# Patient Record
Sex: Male | Born: 1945
Health system: Southern US, Community
[De-identification: ages and names within clinical notes are randomized; demographics above are authoritative.]

## PROBLEM LIST (undated history)

## (undated) DIAGNOSIS — H409 Unspecified glaucoma: Secondary | ICD-10-CM

## (undated) DIAGNOSIS — I10 Essential (primary) hypertension: Secondary | ICD-10-CM

## (undated) DIAGNOSIS — E079 Disorder of thyroid, unspecified: Secondary | ICD-10-CM

## (undated) DIAGNOSIS — E785 Hyperlipidemia, unspecified: Secondary | ICD-10-CM

## (undated) DIAGNOSIS — H269 Unspecified cataract: Secondary | ICD-10-CM

## (undated) DIAGNOSIS — L719 Rosacea, unspecified: Secondary | ICD-10-CM

## (undated) DIAGNOSIS — F32A Depression, unspecified: Secondary | ICD-10-CM

## (undated) DIAGNOSIS — F329 Major depressive disorder, single episode, unspecified: Secondary | ICD-10-CM

## (undated) HISTORY — DX: Major depressive disorder, single episode, unspecified: F32.9

## (undated) HISTORY — DX: Essential (primary) hypertension: I10

## (undated) HISTORY — DX: Unspecified cataract: H26.9

## (undated) HISTORY — DX: Unspecified glaucoma: H40.9

## (undated) HISTORY — PX: EYE SURGERY: SHX253

## (undated) HISTORY — DX: Rosacea, unspecified: L71.9

## (undated) HISTORY — DX: Depression, unspecified: F32.A

## (undated) HISTORY — PX: TONSILLECTOMY: SUR1361

## (undated) HISTORY — DX: Hyperlipidemia, unspecified: E78.5

## (undated) HISTORY — DX: Disorder of thyroid, unspecified: E07.9

---

## 2000-12-03 ENCOUNTER — Encounter: Payer: Self-pay | Admitting: Family Medicine

## 2000-12-03 ENCOUNTER — Encounter: Admission: RE | Admit: 2000-12-03 | Discharge: 2000-12-03 | Payer: Self-pay | Admitting: Family Medicine

## 2001-11-16 ENCOUNTER — Emergency Department (HOSPITAL_COMMUNITY): Admission: EM | Admit: 2001-11-16 | Discharge: 2001-11-16 | Payer: Self-pay | Admitting: *Deleted

## 2003-03-06 ENCOUNTER — Encounter: Admission: RE | Admit: 2003-03-06 | Discharge: 2003-03-06 | Payer: Self-pay | Admitting: Family Medicine

## 2003-03-06 ENCOUNTER — Encounter: Payer: Self-pay | Admitting: Family Medicine

## 2008-11-11 ENCOUNTER — Encounter (INDEPENDENT_AMBULATORY_CARE_PROVIDER_SITE_OTHER): Payer: Self-pay | Admitting: *Deleted

## 2010-01-12 ENCOUNTER — Encounter: Admission: RE | Admit: 2010-01-12 | Discharge: 2010-01-12 | Payer: Self-pay | Admitting: Family Medicine

## 2010-02-10 ENCOUNTER — Telehealth: Payer: Self-pay | Admitting: Internal Medicine

## 2010-10-18 NOTE — Progress Notes (Signed)
Summary: Schedule Colonoscopy  Phone Note Outgoing Call Call back at [facility] Phone [phone]   Call placed by: Bernita Buffy CMA Deborra Medina),  Feb 10, 2010 10:12 AM Call placed to: Patient Summary of Call: Left message on patients machine to call back. patient is due for colonoscopy Initial call taken by: Bernita Buffy CMA Deborra Medina),  Feb 10, 2010 10:16 AM  Follow-up for Phone Call        number disconnected. we will mail him a letter to remind him that he is due Follow-up by: Bernita Buffy CMA Deborra Medina),  February 28, 2010 2:09 PM

## 2010-12-22 ENCOUNTER — Other Ambulatory Visit: Payer: Self-pay | Admitting: Family Medicine

## 2010-12-22 DIAGNOSIS — IMO0001 Reserved for inherently not codable concepts without codable children: Secondary | ICD-10-CM

## 2010-12-27 ENCOUNTER — Ambulatory Visit
Admission: RE | Admit: 2010-12-27 | Discharge: 2010-12-27 | Disposition: A | Discharge status: Home / Self Care | Payer: Federal, State, Local not specified - PPO | Source: Ambulatory Visit | Attending: Family Medicine | Admitting: Family Medicine

## 2010-12-27 DIAGNOSIS — IMO0001 Reserved for inherently not codable concepts without codable children: Secondary | ICD-10-CM

## 2011-08-25 ENCOUNTER — Ambulatory Visit (INDEPENDENT_AMBULATORY_CARE_PROVIDER_SITE_OTHER): Payer: Medicare Other

## 2011-08-25 DIAGNOSIS — I10 Essential (primary) hypertension: Secondary | ICD-10-CM

## 2011-08-25 DIAGNOSIS — R5381 Other malaise: Secondary | ICD-10-CM

## 2011-09-10 ENCOUNTER — Ambulatory Visit (INDEPENDENT_AMBULATORY_CARE_PROVIDER_SITE_OTHER): Payer: Medicare Other

## 2011-09-10 DIAGNOSIS — E039 Hypothyroidism, unspecified: Secondary | ICD-10-CM

## 2011-09-10 DIAGNOSIS — F39 Unspecified mood [affective] disorder: Secondary | ICD-10-CM

## 2011-10-16 DIAGNOSIS — H2510 Age-related nuclear cataract, unspecified eye: Secondary | ICD-10-CM | POA: Diagnosis not present

## 2011-10-20 ENCOUNTER — Ambulatory Visit (INDEPENDENT_AMBULATORY_CARE_PROVIDER_SITE_OTHER): Payer: Medicare Other | Admitting: Family Medicine

## 2011-10-20 DIAGNOSIS — E118 Type 2 diabetes mellitus with unspecified complications: Secondary | ICD-10-CM | POA: Insufficient documentation

## 2011-10-20 DIAGNOSIS — I10 Essential (primary) hypertension: Secondary | ICD-10-CM | POA: Diagnosis not present

## 2011-10-20 DIAGNOSIS — E119 Type 2 diabetes mellitus without complications: Secondary | ICD-10-CM

## 2011-10-20 DIAGNOSIS — E039 Hypothyroidism, unspecified: Secondary | ICD-10-CM | POA: Insufficient documentation

## 2011-10-20 DIAGNOSIS — L719 Rosacea, unspecified: Secondary | ICD-10-CM

## 2011-10-20 MED ORDER — METOPROLOL TARTRATE 100 MG PO TABS: 100.0000 mg | ORAL_TABLET | Freq: Two times a day (BID) | ORAL | Status: DC

## 2011-10-20 NOTE — Progress Notes (Signed)
Mr. Bryan Knight is a 66 years old and having trouble with concentration and enough energy. This is an ongoing problem and he is quite frustrated with that. Something done. He is having ongoing hypertension problem which has been difficult to control. His diabetes has been well-controlled. In addition he has a history of depression. He describes the problem as being foggy headed. This problem has been going on for months. He was found to have a low thyroid in the last couple months and he took his thyroid medicine until recently when he decided on his own to stop the medication. We have from [facility] map which is been normal with the exception of a glucose of 162. TSH was 5.034 on 08/25/2011. His B12 was 220 which is normal, and his testosterone total was 463  On today's visit he is in no acute distress clearly frustrated. HEENT is normal. He has no thyroid enlargement. Chest is clear. Heart is regular without murmur or gallop. Patient is alert and appropriate although somewhat frustrated. His cognitive faculties appear to be within normal.  Assessment: I believe Mr. Bryan Knight is having problems with his medications that he is Cymbalta and labetalol. Another possible contributing factor is his underactive thyroid although this is mild. A final possibility of the cause of his difficulty concentrating his depression.  Plan: Neurology assessment in one week after which I would like patient to call me. I would like him to switch from atenolol to metoprolol and stop the Cymbalta. I've asked him to restart his thyroid medication. Finally I asked Mr. Bryan Knight to call me with the name of his neurologists I can forward this note.

## 2011-10-20 NOTE — Patient Instructions (Signed)
The problem concentrating is frustrating.  It may be coming from your medications:  Cymbalta and Labetalol are known to cause problems thinking.  We will stop them and start Metoprolol.  Also, ask the neurologist for his thoughts.  Restart the thyroid medication since underactive thyroid is another cause of lassitude and trouble concentrating.  Hypertension As your heart beats, it forces blood through your arteries. This force is your blood pressure. If the pressure is too high, it is called hypertension (HTN) or high blood pressure. HTN is dangerous because you may have it and not know it. High blood pressure may mean that your heart has to work harder to pump blood. Your arteries may be narrow or stiff. The extra work puts you at risk for heart disease, stroke, and other problems.  Blood pressure consists of two numbers, a higher number over a lower, 110/72, for example. It is stated as "110 over 72." The ideal is below 120 for the top number (systolic) and under 80 for the bottom (diastolic). Write down your blood pressure today. You should pay close attention to your blood pressure if you have certain conditions such as:  Heart failure.   Prior heart attack.   Diabetes   Chronic kidney disease.   Prior stroke.   Multiple risk factors for heart disease.  To see if you have HTN, your blood pressure should be measured while you are seated with your arm held at the level of the heart. It should be measured at least twice. A one-time elevated blood pressure reading (especially in the Emergency Department) does not mean that you need treatment. There may be conditions in which the blood pressure is different between your right and left arms. It is important to see your caregiver soon for a recheck. Most people have essential hypertension which means that there is not a specific cause. This type of high blood pressure may be lowered by changing lifestyle factors such as:  Stress.   Smoking.    Lack of exercise.   Excessive weight.   Drug/tobacco/alcohol use.   Eating less salt.  Most people do not have symptoms from high blood pressure until it has caused damage to the body. Effective treatment can often prevent, delay or reduce that damage. TREATMENT  When a cause has been identified, treatment for high blood pressure is directed at the cause. There are a large number of medications to treat HTN. These fall into several categories, and your caregiver will help you select the medicines that are best for you. Medications may have side effects. You should review side effects with your caregiver. If your blood pressure stays high after you have made lifestyle changes or started on medicines,   Your medication(s) may need to be changed.   Other problems may need to be addressed.   Be certain you understand your prescriptions, and know how and when to take your medicine.   Be sure to follow up with your caregiver within the time frame advised (usually within two weeks) to have your blood pressure rechecked and to review your medications.   If you are taking more than one medicine to lower your blood pressure, make sure you know how and at what times they should be taken. Taking two medicines at the same time can result in blood pressure that is too low.  SEEK IMMEDIATE MEDICAL CARE IF:  You develop a severe headache, blurred or changing vision, or confusion.   You have unusual weakness or numbness, or  a faint feeling.   You have severe chest or abdominal pain, vomiting, or breathing problems.  MAKE SURE YOU:   Understand these instructions.   Will watch your condition.   Will get help right away if you are not doing well or get worse.  Document Released: 09/04/2005 Document Revised: 05/17/2011 Document Reviewed: 04/24/2008 [facility] Patient Information 2012 [place].

## 2011-10-25 DIAGNOSIS — F329 Major depressive disorder, single episode, unspecified: Secondary | ICD-10-CM | POA: Diagnosis not present

## 2011-10-25 DIAGNOSIS — E538 Deficiency of other specified B group vitamins: Secondary | ICD-10-CM | POA: Diagnosis not present

## 2011-11-01 DIAGNOSIS — E538 Deficiency of other specified B group vitamins: Secondary | ICD-10-CM | POA: Diagnosis not present

## 2011-11-08 DIAGNOSIS — E538 Deficiency of other specified B group vitamins: Secondary | ICD-10-CM | POA: Diagnosis not present

## 2011-11-15 DIAGNOSIS — E538 Deficiency of other specified B group vitamins: Secondary | ICD-10-CM | POA: Diagnosis not present

## 2011-11-17 ENCOUNTER — Ambulatory Visit (INDEPENDENT_AMBULATORY_CARE_PROVIDER_SITE_OTHER): Payer: Medicare Other | Admitting: Family Medicine

## 2011-11-17 VITALS — BP 207/84 | HR 68 | Temp 98.0°F | Resp 20 | Ht 68.0 in | Wt 164.0 lb

## 2011-11-17 DIAGNOSIS — J069 Acute upper respiratory infection, unspecified: Secondary | ICD-10-CM | POA: Diagnosis not present

## 2011-11-17 DIAGNOSIS — I10 Essential (primary) hypertension: Secondary | ICD-10-CM | POA: Diagnosis not present

## 2011-11-17 MED ORDER — LISINOPRIL-HYDROCHLOROTHIAZIDE 20-25 MG PO TABS: 1.0000 | ORAL_TABLET | Freq: Every day | ORAL | Status: DC

## 2011-11-17 NOTE — Patient Instructions (Signed)
Saline nasal spray for nasal congestion.  Drink plenty of fluids.  Afrin nasal spray - 1 spray in only one nostril at bedtime if needed (only if no relief with saline nasal spray).  Check blood pressures outside of clinic, and recheck in next 2 weeks.  If any chest pain, headaches, or new symptoms - return to the clinic or to an emergency room.   Upper Respiratory Infection, Adult An upper respiratory infection (URI) is also known as the common cold. It is often caused by a type of germ (virus). Colds are easily spread (contagious). You can pass it to others by kissing, coughing, sneezing, or drinking out of the same glass. Usually, you get better in 1 or 2 weeks.  HOME CARE   Only take medicine as told by your doctor.   Use a warm mist humidifier or breathe in steam from a hot shower.   Drink enough water and fluids to keep your pee (urine) clear or pale yellow.   Get plenty of rest.   Return to work when your temperature is back to normal or as told by your doctor. You may use a face mask and wash your hands to stop your cold from spreading.  GET HELP RIGHT AWAY IF:   After the first few days, you feel you are getting worse.   You have questions about your medicine.   You have chills, shortness of breath, or brown or red spit (mucus).   You have yellow or brown snot (nasal discharge) or pain in the face, especially when you bend forward.   You have a fever, puffy (swollen) neck, pain when you swallow, or white spots in the back of your throat.   You have a bad headache, ear pain, sinus pain, or chest pain.   You have a high-pitched whistling sound when you breathe in and out (wheezing).   You have a lasting cough or cough up blood.   You have sore muscles or a stiff neck.  MAKE SURE YOU:   Understand these instructions.   Will watch your condition.   Will get help right away if you are not doing well or get worse.  Document Released: 02/21/2008 Document Revised: 05/17/2011  Document Reviewed: 01/09/2011  [facility] Patient Information 2012 [place].  Return to the clinic or go to the nearest emergency room if any of your symptoms worsen or new symptoms occur.

## 2011-11-17 NOTE — Progress Notes (Signed)
  Subjective:    Patient ID: Bryan Knight, male    DOB: August 01, 1946, 66 y.o.   MRN: [id number]  HPI Bryan Knight is a 66 y.o. male Cough congesstion for 4-5 days, sneezing.  Blocked in nose when lying down.  Initially on R side.  Clear congestion.  Hx of HTN.  No missed doses. BP 170/89 last night.   Wife with same sx's.  Tx: Dayquil x 2 this am.    Review of Systems  Constitutional: Negative for fever and chills.  HENT: Positive for congestion, rhinorrhea, sneezing and sinus pressure. Negative for ear pain.   Eyes: Negative for redness.  Respiratory: Positive for cough. Negative for chest tightness, shortness of breath and wheezing.        Minimal cough.  Cardiovascular: Negative for chest pain and palpitations.  Neurological: Negative for seizures, facial asymmetry, speech difficulty, weakness and light-headedness.       Objective:   Physical Exam  Constitutional: He is oriented to person, place, and time. He appears well-developed and well-nourished.  HENT:  Head: Normocephalic and atraumatic.  Right Ear: Tympanic membrane, external ear and ear canal normal.  Left Ear: Tympanic membrane, external ear and ear canal normal.  Nose: No mucosal edema, rhinorrhea or sinus tenderness. Right sinus exhibits no maxillary sinus tenderness and no frontal sinus tenderness. Left sinus exhibits no maxillary sinus tenderness and no frontal sinus tenderness.  Mouth/Throat: Oropharynx is clear and moist and mucous membranes are normal. No oropharyngeal exudate or posterior oropharyngeal erythema.       Rhinophyma  Eyes: Conjunctivae and EOM are normal. Pupils are equal, round, and reactive to light.  Neck: Normal range of motion. Neck supple.  Cardiovascular: Normal rate, regular rhythm, normal heart sounds and intact distal pulses.   No murmur heard. Pulmonary/Chest: Effort normal and breath sounds normal. No respiratory distress. He has no wheezes. He has no rhonchi. He has no rales.    Abdominal: Soft. There is no tenderness.  Lymphadenopathy:    He has no cervical adenopathy.  Neurological: He is alert and oriented to person, place, and time.  Skin: Skin is warm and dry. No rash noted.  Psychiatric: He has a normal mood and affect. His behavior is normal.          Assessment & Plan:   1. Uri - sx care - saline nasal spray prn, afrin in 1 nostril only qhs prn up to 3 days. avoid decongestants.  If not improving in next 5-7 days, can call re: possible abx, RTC if worsening or chest sx's.  HTN - uncontrolled.  Component of cold meds - stop otc cold meds except as advised above, increase lisinopril hct to 20/25mg , check outside BP's and rtc next 2 weeks.  Last cr 1.34 in 12/12.  Check BMP.  Return to the clinic or go to the nearest emergency room if symptoms worsen or new symptoms occur.

## 2011-11-18 LAB — BASIC METABOLIC PANEL
Chloride: 96 mEq/L (ref 96–112)
Creat: 1.26 mg/dL (ref 0.50–1.35)
Potassium: 3.5 mEq/L (ref 3.5–5.3)
Sodium: 139 mEq/L (ref 135–145)

## 2011-12-04 ENCOUNTER — Other Ambulatory Visit: Payer: Self-pay | Admitting: Physician Assistant

## 2011-12-11 ENCOUNTER — Ambulatory Visit (INDEPENDENT_AMBULATORY_CARE_PROVIDER_SITE_OTHER): Payer: Medicare Other | Admitting: Family Medicine

## 2011-12-11 VITALS — BP 182/76 | HR 57 | Temp 97.8°F | Resp 16 | Ht 68.0 in | Wt 164.4 lb

## 2011-12-11 DIAGNOSIS — F411 Generalized anxiety disorder: Secondary | ICD-10-CM

## 2011-12-11 DIAGNOSIS — F419 Anxiety disorder, unspecified: Secondary | ICD-10-CM

## 2011-12-11 MED ORDER — DULOXETINE HCL 30 MG PO CPEP: 30.0000 mg | ORAL_CAPSULE | Freq: Every day | ORAL | Status: DC

## 2011-12-11 NOTE — Progress Notes (Signed)
66 yo man with constant mind racing who saw neurologist in February.  The neurologist stopped the Cymbalta and started b12 shots.  Since that time he has experienced electric shocks in front part of head that keeps him awake.  He cannot sleep.   Also, glucometer dysfunctional  O:  Appears frustrated.  Alert and appropriate,  Reasonably articulate  A:  Suspect withdrawal from cymbalta  P: Cymbalta 30 qd Call 48 hours Consider [facility] referral Refill glucometer

## 2011-12-13 DIAGNOSIS — E538 Deficiency of other specified B group vitamins: Secondary | ICD-10-CM | POA: Diagnosis not present

## 2012-01-02 ENCOUNTER — Encounter: Payer: Self-pay | Admitting: Internal Medicine

## 2012-01-02 ENCOUNTER — Telehealth: Payer: Self-pay

## 2012-01-04 ENCOUNTER — Telehealth: Payer: Self-pay | Admitting: *Deleted

## 2012-01-04 DIAGNOSIS — F419 Anxiety disorder, unspecified: Secondary | ICD-10-CM

## 2012-01-04 MED ORDER — DULOXETINE HCL 30 MG PO CPEP: 30.0000 mg | ORAL_CAPSULE | Freq: Every day | ORAL | Status: DC

## 2012-01-04 NOTE — Telephone Encounter (Signed)
Done. Needs to follow up.  Verlean Allport

## 2012-01-04 NOTE — Telephone Encounter (Signed)
Pt needs a refill on Cymbalta sent into [facility].  It looks like we may have received a call from [facility] but the message was not sent to clinical

## 2012-01-05 NOTE — Telephone Encounter (Signed)
Spoke with patient and let him know that rx was sent to [facility] and that he needs to come in for follow up.  Patient states that he understands.

## 2012-01-10 DIAGNOSIS — E538 Deficiency of other specified B group vitamins: Secondary | ICD-10-CM | POA: Diagnosis not present

## 2012-01-11 ENCOUNTER — Encounter: Payer: Self-pay | Admitting: Family Medicine

## 2012-01-11 ENCOUNTER — Ambulatory Visit (INDEPENDENT_AMBULATORY_CARE_PROVIDER_SITE_OTHER): Payer: Medicare Other | Admitting: Family Medicine

## 2012-01-11 ENCOUNTER — Telehealth: Payer: Self-pay | Admitting: Internal Medicine

## 2012-01-11 VITALS — BP 136/80 | HR 54 | Temp 97.4°F | Resp 16 | Ht 68.0 in | Wt 161.8 lb

## 2012-01-11 DIAGNOSIS — B351 Tinea unguium: Secondary | ICD-10-CM

## 2012-01-11 DIAGNOSIS — F419 Anxiety disorder, unspecified: Secondary | ICD-10-CM

## 2012-01-11 DIAGNOSIS — I10 Essential (primary) hypertension: Secondary | ICD-10-CM | POA: Diagnosis not present

## 2012-01-11 DIAGNOSIS — E119 Type 2 diabetes mellitus without complications: Secondary | ICD-10-CM | POA: Diagnosis not present

## 2012-01-11 DIAGNOSIS — E538 Deficiency of other specified B group vitamins: Secondary | ICD-10-CM | POA: Diagnosis not present

## 2012-01-11 DIAGNOSIS — E559 Vitamin D deficiency, unspecified: Secondary | ICD-10-CM | POA: Diagnosis not present

## 2012-01-11 DIAGNOSIS — E039 Hypothyroidism, unspecified: Secondary | ICD-10-CM

## 2012-01-11 LAB — POCT GLYCOSYLATED HEMOGLOBIN (HGB A1C): Hemoglobin A1C: 5.7

## 2012-01-11 MED ORDER — LEVOTHYROXINE SODIUM 25 MCG PO TABS: 25.0000 ug | ORAL_TABLET | Freq: Every day | ORAL | Status: DC

## 2012-01-11 MED ORDER — DULOXETINE HCL 30 MG PO CPEP
30.0000 mg | ORAL_CAPSULE | Freq: Every day | ORAL | Status: DC
Start: 2012-01-11 — End: 2013-04-25

## 2012-01-11 NOTE — Progress Notes (Signed)
S:  67 yo man who has been having trouble concentrating.  He has been seeing a neurologist and started on B12.  He also been on Cymbalta for 8-9 years.  Ran out of thyroid medication  Filed for bankruptcy 2 years ago. I spent extended period of time reviewing patient's BP readings and discussing his problems with anxiety and concentration F/H hypertension (mother)  O:  BP 152/70  anxious HEENT:  Unremarkable Chest: clear Heart: reg, no murmur Feet:  Normal filament testing, good pulses, horrible nails  A: anxiety/depression:  It is so hard to get a handle on Mr. Ramones problems concentrating.  I do not feel the B12 has helped him, and the neurologist has not shed any additional light on what is going on Diabetes controlled Hypertension, systolic:  Only Doutt control  P:  Psychiatric consult - Donneta Romberg chk labs today Continue the current meds. Cancel neurology appt

## 2012-01-11 NOTE — Telephone Encounter (Signed)
Cymbalta 30 mg one daily #90 3 RF already e-prescribed for pt, not refilled

## 2012-01-12 LAB — COMPREHENSIVE METABOLIC PANEL
ALT: 13 U/L (ref 0–53)
AST: 19 U/L (ref 0–37)
Albumin: 4.5 g/dL (ref 3.5–5.2)
Alkaline Phosphatase: 40 U/L (ref 39–117)
BUN: 25 mg/dL — ABNORMAL HIGH (ref 6–23)
CO2: 28 mEq/L (ref 19–32)
Calcium: 9.4 mg/dL (ref 8.4–10.5)
Chloride: 98 mEq/L (ref 96–112)
Creat: 1.34 mg/dL (ref 0.50–1.35)
Glucose, Bld: 119 mg/dL — ABNORMAL HIGH (ref 70–99)
Potassium: 3.6 mEq/L (ref 3.5–5.3)
Sodium: 140 mEq/L (ref 135–145)
Total Bilirubin: 0.5 mg/dL (ref 0.3–1.2)
Total Protein: 7 g/dL (ref 6.0–8.3)

## 2012-01-12 LAB — VITAMIN B12: Vitamin B-12: 1663 pg/mL — ABNORMAL HIGH (ref 211–911)

## 2012-01-12 LAB — TSH: TSH: 3.102 u[IU]/mL (ref 0.350–4.500)

## 2012-01-24 DIAGNOSIS — B351 Tinea unguium: Secondary | ICD-10-CM | POA: Diagnosis not present

## 2012-01-24 DIAGNOSIS — S92919A Unspecified fracture of unspecified toe(s), initial encounter for closed fracture: Secondary | ICD-10-CM | POA: Diagnosis not present

## 2012-01-24 DIAGNOSIS — L600 Ingrowing nail: Secondary | ICD-10-CM | POA: Diagnosis not present

## 2012-01-26 ENCOUNTER — Other Ambulatory Visit: Payer: Self-pay | Admitting: Physician Assistant

## 2012-02-07 ENCOUNTER — Ambulatory Visit (INDEPENDENT_AMBULATORY_CARE_PROVIDER_SITE_OTHER): Payer: Medicare Other | Admitting: Family Medicine

## 2012-02-07 VITALS — BP 142/86 | HR 68 | Temp 97.8°F | Resp 16 | Ht 68.0 in | Wt 162.4 lb

## 2012-02-07 DIAGNOSIS — I10 Essential (primary) hypertension: Secondary | ICD-10-CM

## 2012-02-07 DIAGNOSIS — F411 Generalized anxiety disorder: Secondary | ICD-10-CM | POA: Diagnosis not present

## 2012-02-07 DIAGNOSIS — F419 Anxiety disorder, unspecified: Secondary | ICD-10-CM

## 2012-02-07 DIAGNOSIS — J069 Acute upper respiratory infection, unspecified: Secondary | ICD-10-CM | POA: Diagnosis not present

## 2012-02-07 MED ORDER — LISINOPRIL-HYDROCHLOROTHIAZIDE 20-12.5 MG PO TABS: 1.0000 | ORAL_TABLET | Freq: Every day | ORAL | Status: DC

## 2012-02-07 MED ORDER — AZITHROMYCIN 250 MG PO TABS: ORAL_TABLET | ORAL | Status: AC

## 2012-02-07 NOTE — Progress Notes (Signed)
[place] 66 year old gentleman with diabetes and hypertension comes in with recent cough and hoarseness. Has been going on about 3 days. He notes that after he cuts his grass he often has a cough but it goes away after a few hours.  His blood pressures been running well and his blood sugar was 100 110 in the last week. Generally been feeling very good. Apparently somebody rewrote his blood pressure medicine and a different dose and he was concerned about this.  We talked about his recent troubles with bankruptcy and that finances. Interestingly, he try to make an appointment with the psychiatrist but after going through a phone [phone] different people he became frustrated and decided to call them back at a later date. Ironically, all his symptoms cleared at night and he's not had any more depression or anxiety since.  Objective: HEENT: Normal TMs with the exception of an old scar in the right suggestive of perforation from 8 myringotomy tube; red posterior pharynx with mucopurulent postnasal discharge.  Neck: Supple no adenopathy  Patient is in no acute distress  Chest: Faint wheeze which clears with cough  Heart: Regular no murmur  Assessment: URI, good control blood pressure and diabetes at this point. His anxiety problems seem to be resolved at this point.  Plan: Z-Pak, I rewrote his blood pressure medicine for the correct dose lisinopril 20-12.5

## 2012-04-12 DIAGNOSIS — E109 Type 1 diabetes mellitus without complications: Secondary | ICD-10-CM | POA: Diagnosis not present

## 2012-04-12 DIAGNOSIS — H4010X0 Unspecified open-angle glaucoma, stage unspecified: Secondary | ICD-10-CM | POA: Diagnosis not present

## 2012-04-12 DIAGNOSIS — H2510 Age-related nuclear cataract, unspecified eye: Secondary | ICD-10-CM | POA: Diagnosis not present

## 2012-04-29 ENCOUNTER — Ambulatory Visit (INDEPENDENT_AMBULATORY_CARE_PROVIDER_SITE_OTHER): Payer: Medicare Other | Admitting: Family Medicine

## 2012-04-29 VITALS — BP 140/76 | HR 60 | Temp 97.6°F | Resp 16 | Ht 69.0 in | Wt 166.0 lb

## 2012-04-29 DIAGNOSIS — R05 Cough: Secondary | ICD-10-CM

## 2012-04-29 DIAGNOSIS — R6889 Other general symptoms and signs: Secondary | ICD-10-CM

## 2012-04-29 DIAGNOSIS — R059 Cough, unspecified: Secondary | ICD-10-CM

## 2012-04-29 DIAGNOSIS — R067 Sneezing: Secondary | ICD-10-CM

## 2012-04-29 MED ORDER — FLUTICASONE PROPIONATE 50 MCG/ACT NA SUSP: 2.0000 | Freq: Every day | NASAL | Status: DC

## 2012-04-29 MED ORDER — BENZONATATE 100 MG PO CAPS: 100.0000 mg | ORAL_CAPSULE | Freq: Three times a day (TID) | ORAL | Status: AC | PRN

## 2012-04-29 NOTE — Progress Notes (Signed)
[facility] [address] [place] [address] [phone]  Date:  04/29/2012   Name:  Bryan Knight   DOB:  1945-12-17   MRN:  [id number]  PCP:  [name], MD    Chief Complaint: Cough and URI   History of Present Illness:  Bryan Knight is a 66 y.o. very pleasant male patient who presents with the following:  About 4 days ago he noted a ST and right sides sinus congestion.  His ST is resolved, but now he has sneezing and coughing.  He does not feel bad, no fever, chills or aches.  Cough is not productive.  His nose does run some.  No GI symptoms.   He did try some sort of OTC medication.  His wife was concerned and wanted him to have an evaluation today.  He is a former smoker- quit a long time ago  Patient Active Problem List  Diagnosis  . Hypertension  . Diabetes mellitus type II, controlled  . Hypothyroid  . Rosacea  . B12 deficiency    Past Medical History  Diagnosis Date  . Hypertension   . Hearing loss   . Depression   . Diabetes mellitus   . Rosacea   . Thyroid disease   . Hyperlipidemia     No past surgical history on file.  History  Substance Use Topics  . Smoking status: Former Smoker    Types: Cigarettes    Quit date: 10/18/1967  . Smokeless tobacco: Not on file  . Alcohol Use: Not on file    Family History  Problem Relation Age of Onset  . Hypertension Mother   . Cancer Mother     Allergies  Allergen Reactions  . Citalopram   . Wellbutrin (Bupropion Hcl) Other (See Comments)    Sluggish     Medication list has been reviewed and updated.  Current Outpatient Prescriptions on File Prior to Visit  Medication Sig Dispense Refill  . amLODipine (NORVASC) 10 MG tablet TAKE ONE TABLET BY MOUTH ONE TIME DAILY  30 tablet  2  . aspirin 81 MG tablet Take 160 mg by mouth daily.      . DULoxetine (CYMBALTA) 30 MG capsule Take 1 capsule (30 mg total) by mouth daily.  90 capsule  3  . JANUMET 50-1000 MG per tablet TAKE 1  TABLET TWICE A DAY  WITH MEALS  180 tablet  3  . latanoprost (XALATAN) 0.005 % ophthalmic solution Place 1 drop into both eyes at bedtime.      Marland Kitchen levothyroxine (SYNTHROID, LEVOTHROID) 25 MCG tablet Take 1 tablet (25 mcg total) by mouth daily.  90 tablet  3  . lisinopril-hydrochlorothiazide (ZESTORETIC) 20-12.5 MG per tablet Take 1 tablet by mouth daily.  90 tablet  3  . metoprolol (LOPRESSOR) 100 MG tablet Take 1 tablet (100 mg total) by mouth 2 (two) times daily.  120 tablet  3  . Pseudoephedrine-APAP-DM (DAYQUIL PO) Take 1 capsule by mouth as needed.        Review of Systems:  As per HPI- otherwise negative.   Physical Examination: Filed Vitals:   04/29/12 0959  BP: 140/76  Pulse: 60  Temp: 97.6 F (36.4 C)  Resp: 16   Filed Vitals:   04/29/12 0959  Height: 5\' 9"  (1.753 m)  Weight: 166 lb (75.297 kg)   Body mass index is 24.51 kg/(m^2). Ideal Body Weight: Weight in (lb) to have BMI = 25: 168.9   GEN: WDWN,  NAD, Non-toxic, A & O x 3 HEENT: Atraumatic, Normocephalic. Neck supple. No masses, No LAD.  Tm and oropharynx wnl, nasal cavity congested.  PEERL, EOMI Ears and Nose: No external deformity. CV: RRR, No M/G/R. No JVD. No thrill. No extra heart sounds. PULM: CTA B, no wheezes, crackles, rhonchi. No retractions. No resp. distress. No accessory muscle use. EXTR: No c/c/e NEURO Normal gait.  PSYCH: Normally interactive. Conversant. Not depressed or anxious appearing.  Calm demeanor.    Assessment and Plan: 1. Cough  benzonatate (TESSALON) 100 MG capsule  2. Sneeze  fluticasone (FLONASE) 50 MCG/ACT nasal spray   Suspect that Lazer has a viral URI or allergies.  Will treat conservatively as above.  Patient (or parent if minor) instructed to return to clinic or call if not better in 2-3 day(s).  Sooner if worse.      Lamar Blinks, MD

## 2012-04-30 ENCOUNTER — Telehealth: Payer: Self-pay

## 2012-04-30 MED ORDER — HYDROCODONE-HOMATROPINE 5-1.5 MG/5ML PO SYRP: ORAL_SOLUTION | ORAL | Status: AC

## 2012-04-30 NOTE — Telephone Encounter (Signed)
Faxed Rx to [facility] and notified pt.

## 2012-04-30 NOTE — Telephone Encounter (Signed)
Pt seen yesterday for cough/allergies,cough now worse,requesting cough syrup called into pharmacy.   Best phone [phone]  [facility] [address]

## 2012-04-30 NOTE — Telephone Encounter (Signed)
Done and printed

## 2012-05-03 ENCOUNTER — Other Ambulatory Visit: Payer: Self-pay | Admitting: Physician Assistant

## 2012-06-05 DIAGNOSIS — Z23 Encounter for immunization: Secondary | ICD-10-CM | POA: Diagnosis not present

## 2012-07-04 ENCOUNTER — Other Ambulatory Visit: Payer: Self-pay | Admitting: Physician Assistant

## 2012-07-19 ENCOUNTER — Other Ambulatory Visit: Payer: Self-pay | Admitting: Family Medicine

## 2012-08-05 ENCOUNTER — Ambulatory Visit (INDEPENDENT_AMBULATORY_CARE_PROVIDER_SITE_OTHER): Payer: Medicare Other | Admitting: Family Medicine

## 2012-08-05 VITALS — BP 183/85 | HR 61 | Temp 98.0°F | Resp 18 | Ht 69.0 in | Wt 171.0 lb

## 2012-08-05 DIAGNOSIS — I10 Essential (primary) hypertension: Secondary | ICD-10-CM

## 2012-08-05 DIAGNOSIS — B49 Unspecified mycosis: Secondary | ICD-10-CM | POA: Diagnosis not present

## 2012-08-05 DIAGNOSIS — E079 Disorder of thyroid, unspecified: Secondary | ICD-10-CM | POA: Diagnosis not present

## 2012-08-05 DIAGNOSIS — E119 Type 2 diabetes mellitus without complications: Secondary | ICD-10-CM | POA: Diagnosis not present

## 2012-08-05 DIAGNOSIS — K4090 Unilateral inguinal hernia, without obstruction or gangrene, not specified as recurrent: Secondary | ICD-10-CM

## 2012-08-05 DIAGNOSIS — B379 Candidiasis, unspecified: Secondary | ICD-10-CM

## 2012-08-05 LAB — COMPREHENSIVE METABOLIC PANEL
ALT: 14 U/L (ref 0–53)
AST: 18 U/L (ref 0–37)
Albumin: 4.8 g/dL (ref 3.5–5.2)
Alkaline Phosphatase: 40 U/L (ref 39–117)
BUN: 21 mg/dL (ref 6–23)
Potassium: 3.4 mEq/L — ABNORMAL LOW (ref 3.5–5.3)
Sodium: 143 mEq/L (ref 135–145)

## 2012-08-05 LAB — COMPREHENSIVE METABOLIC PANEL WITH GFR
CO2: 31 meq/L (ref 19–32)
Calcium: 10 mg/dL (ref 8.4–10.5)
Chloride: 96 meq/L (ref 96–112)
Creat: 1.42 mg/dL — ABNORMAL HIGH (ref 0.50–1.35)
Glucose, Bld: 145 mg/dL — ABNORMAL HIGH (ref 70–99)
Total Bilirubin: 0.6 mg/dL (ref 0.3–1.2)
Total Protein: 7.3 g/dL (ref 6.0–8.3)

## 2012-08-05 LAB — POCT GLYCOSYLATED HEMOGLOBIN (HGB A1C): Hemoglobin A1C: 6

## 2012-08-05 LAB — TSH: TSH: 4.28 u[IU]/mL (ref 0.350–4.500)

## 2012-08-05 MED ORDER — NYSTATIN 100000 UNIT/GM EX CREA: TOPICAL_CREAM | Freq: Two times a day (BID) | CUTANEOUS | Status: DC

## 2012-08-05 MED ORDER — LISINOPRIL-HYDROCHLOROTHIAZIDE 20-12.5 MG PO TABS: 2.0000 | ORAL_TABLET | Freq: Every day | ORAL | Status: DC

## 2012-08-05 NOTE — Progress Notes (Signed)
[facility]:  Office Visit  Chief Complaint:  Chief Complaint  Patient presents with  . Hernia    left side 2-3 days painful to touch near groin area    HPI: Bryan Knight is a 66 y.o. male who complains of  Left bulge in groin x 2-3 days. Denies any nausea, vomiting. 2/10 pain when press on it. No abd pain,, fevers, chills, ckin changes. No change when cough, abdominal pain, lifting, running.  Diabetes-no neuroptahy, no polydipisia, polyuria, UTD on flu vaccine nad PNA vaccine, eye exam HTN-at home 150s-160s/80s, renal US both normal, no edema in legs. No SEs. Does not drink alcohol.   Past Medical History  Diagnosis Date  . Hypertension   . Hearing loss   . Depression   . Diabetes mellitus   . Rosacea   . Thyroid disease   . Hyperlipidemia   . Cataract   . Glaucoma    Past Surgical History  Procedure Date  . Eye surgery   . Tonsillectomy     over 50 yrs ago   History   Social History  . Marital Status: Married    Spouse Name: N/A    Number of Children: N/A  . Years of Education: N/A   Social History Main Topics  . Smoking status: Former Smoker    Types: Cigarettes    Quit date: 10/18/1967  . Smokeless tobacco: None  . Alcohol Use: No  . Drug Use: No  . Sexually Active: None     Comment: married   Other Topics Concern  . None   Social History Narrative  . None   Family History  Problem Relation Age of Onset  . Hypertension Mother   . Cancer Mother    Allergies  Allergen Reactions  . Citalopram   . Wellbutrin (Bupropion Hcl) Other (See Comments)    Sluggish    Prior to Admission medications   Medication Sig Start Date End Date Taking? Authorizing Provider  amLODipine (NORVASC) 10 MG tablet Take 1 tablet (10 mg total) by mouth daily. Needs office visit 07/04/12  Yes Collene Leyden, PA-C  aspirin 81 MG tablet Take 160 mg by mouth daily.   Yes Historical Provider, MD  DULoxetine (CYMBALTA) 30 MG capsule Take 1 capsule (30 mg  total) by mouth daily. 01/11/12 01/10/13 Yes [name], MD  JANUMET 50-1000 MG per tablet TAKE 1 TABLET TWICE A DAY  WITH MEALS 12/04/11  Yes Chelle S Jeffery, PA-C  latanoprost (XALATAN) 0.005 % ophthalmic solution Place 1 drop into both eyes at bedtime.   Yes Historical Provider, MD  levothyroxine (SYNTHROID, LEVOTHROID) 25 MCG tablet Take 1 tablet (25 mcg total) by mouth daily. 01/11/12  Yes [name], MD  lisinopril-hydrochlorothiazide (ZESTORETIC) 20-12.5 MG per tablet Take 1 tablet by mouth daily. 02/07/12 02/06/13 Yes [name], MD  metoprolol (LOPRESSOR) 100 MG tablet TAKE ONE TABLET BY MOUTH TWICE DAILY 07/19/12  Yes Ryan M Dunn, PA-C  fluticasone [facility]) 50 MCG/ACT nasal spray Place 2 sprays into the nose daily. 04/29/12 04/29/13  Gay Filler Copland, MD  Pseudoephedrine-APAP-DM (DAYQUIL PO) Take 1 capsule by mouth as needed.    Historical Provider, MD     ROS: The patient denies fevers, chills, night sweats, unintentional weight loss, chest pain, palpitations, wheezing, dyspnea on exertion, nausea, vomiting, abdominal pain, dysuria, hematuria, melena, numbness, weakness, or tingling.  All other systems have been reviewed and were otherwise negative with the exception of those mentioned in the HPI  and as above.    PHYSICAL EXAM: Filed Vitals:   08/05/12 0929  BP: 183/85  Pulse: 61  Temp: 98 F (36.7 C)  Resp: 18   Filed Vitals:   08/05/12 0929  Height: 5\' 9"  (1.753 m)  Weight: 171 lb (77.565 kg)   Body mass index is 25.25 kg/(m^2).  General: Alert, no acute distress HEENT:  Normocephalic, atraumatic, oropharynx patent. Fundoscopic exam nl Cardiovascular:  Regular rate and rhythm, no rubs murmurs or gallops.  No Carotid bruits, radial pulse intact. No pedal edema.  Respiratory: Clear to auscultation bilaterally.  No wheezes, rales, or rhonchi.  No cyanosis, no use of accessory musculature GI: No organomegaly, abdomen is soft and non-tender, positive bowel sounds.   No masses. Skin: No rashes. Neurologic: Facial musculature symmetric. Psychiatric: Patient is appropriate throughout our interaction. Lymphatic: No cervical lymphadenopathy Musculoskeletal: Gait intact. Left inguinal hernia, not incarcerated   LABS: Results for orders placed in visit on 01/11/12  VITAMIN B12      Component Value Range   Vitamin B-12 1663 (*) 211 - 911 pg/mL  POCT GLYCOSYLATED HEMOGLOBIN (HGB A1C)      Component Value Range   Hemoglobin A1C 5.7    COMPREHENSIVE METABOLIC PANEL      Component Value Range   Sodium 140  135 - 145 mEq/L   Potassium 3.6  3.5 - 5.3 mEq/L   Chloride 98  96 - 112 mEq/L   CO2 28  19 - 32 mEq/L   Glucose, Bld 119 (*) 70 - 99 mg/dL   BUN 25 (*) 6 - 23 mg/dL   Creat 1.34  0.50 - 1.35 mg/dL   Total Bilirubin 0.5  0.3 - 1.2 mg/dL   Alkaline Phosphatase 40  39 - 117 U/L   AST 19  0 - 37 U/L   ALT 13  0 - 53 U/L   Total Protein 7.0  6.0 - 8.3 g/dL   Albumin 4.5  3.5 - 5.2 g/dL   Calcium 9.4  8.4 - 10.5 mg/dL  TSH      Component Value Range   TSH 3.102  0.350 - 4.500 uIU/mL     EKG/XRAY:   Primary read interpreted by Dr. Marin Comment at [facility].   ASSESSMENT/PLAN: Encounter Diagnoses  Name Primary?  . Left inguinal hernia Yes  . Yeast infection   . HTN (hypertension)   . Diabetes   . Hypertension   . Thyroid disease     1. Will refer to surgeon if desires, patient wants to monitor hernia, F/u in ER if have s/sx of incarceration 2. HTN, Thyroid, and DM-labs pending 3. Will increase Zestoretic  To BID he will take 40 mg/25 mg daily  4. Follow-up in 6 months   LE, [place], DO 08/05/2012 10:44 AM

## 2012-08-15 ENCOUNTER — Encounter: Payer: Self-pay | Admitting: Family Medicine

## 2012-08-18 ENCOUNTER — Other Ambulatory Visit: Payer: Self-pay | Admitting: Physician Assistant

## 2012-09-15 ENCOUNTER — Ambulatory Visit (INDEPENDENT_AMBULATORY_CARE_PROVIDER_SITE_OTHER): Payer: Medicare Other | Admitting: Family Medicine

## 2012-09-15 VITALS — BP 173/91 | HR 58 | Temp 97.5°F | Resp 16 | Ht 69.5 in | Wt 172.0 lb

## 2012-09-15 DIAGNOSIS — R059 Cough, unspecified: Secondary | ICD-10-CM

## 2012-09-15 DIAGNOSIS — K219 Gastro-esophageal reflux disease without esophagitis: Secondary | ICD-10-CM

## 2012-09-15 DIAGNOSIS — R05 Cough: Secondary | ICD-10-CM

## 2012-09-15 MED ORDER — HYDROCODONE-HOMATROPINE 5-1.5 MG/5ML PO SYRP: ORAL_SOLUTION | ORAL | Status: DC

## 2012-09-15 MED ORDER — BENZONATATE 100 MG PO CAPS: 100.0000 mg | ORAL_CAPSULE | Freq: Three times a day (TID) | ORAL | Status: DC | PRN

## 2012-09-15 NOTE — Progress Notes (Signed)
Subjective:    Patient ID: Bryan Knight, male    DOB: June 11, 1946, 66 y.o.   MRN: [id number]  HPI Bryan Knight is a 66 y.o. male Cough - started 2 nights ago.  No fever, no congestion, no runny nose.  No known sick contacts.  Dry cough - worse at night.   Tx: tried tessalon perles - 3 doses, tried leftover hydromet - min relief.  Flu vaccine in September.   Review of Systems  Constitutional: Negative for fever, chills, activity change and appetite change.  HENT: Negative for congestion and rhinorrhea.   Respiratory: Negative for shortness of breath and wheezing. Cough: dry.   Cardiovascular: Negative for chest pain.  Gastrointestinal:       Heartburn at times - takes tums occasionally.        Objective:   Physical Exam  Vitals reviewed. Constitutional: He is oriented to person, place, and time. He appears well-developed and well-nourished.  HENT:  Head: Normocephalic and atraumatic.  Right Ear: Tympanic membrane, external ear and ear canal normal.  Left Ear: Tympanic membrane, external ear and ear canal normal.  Nose: No rhinorrhea.  Mouth/Throat: Oropharynx is clear and moist and mucous membranes are normal. No oropharyngeal exudate or posterior oropharyngeal erythema.  Eyes: Conjunctivae normal are normal. Pupils are equal, round, and reactive to light.  Neck: Neck supple.  Cardiovascular: Normal rate, regular rhythm, normal heart sounds and intact distal pulses.   No murmur heard. Pulmonary/Chest: Effort normal and breath sounds normal. He has no wheezes. He has no rhonchi. He has no rales.  Abdominal: Soft. There is no tenderness.  Musculoskeletal: He exhibits no edema.  Lymphadenopathy:    He has no cervical adenopathy.  Neurological: He is alert and oriented to person, place, and time.  Skin: Skin is warm and dry. No rash noted.  Psychiatric: He has a normal mood and affect. His behavior is normal.       Assessment & Plan:  EYDAN TURTON is a 66 y.o. male 1.  Cough  benzonatate (TESSALON) 100 MG capsule, HYDROcodone-homatropine (HYCODAN) 5-1.5 MG/5ML syrup  2. GERD (gastroesophageal reflux disease)     Isolated cough, afebrile, reassuring exam. . Likely early URi vs LPR with GERD.  Start zantac otc, tessalon during day, hycodan qhs prn. Recheck next few days if not improving - sooner if worse.   Patient Instructions   Take Zantac Over the counter one pill 2x a day to see if this is helpful for the cough/acid reflux use the cough syrup at night if needed. If you develop fever shortness of breath or worsening of the cough return to clinic for additional studies. Please call us if you have any questions or concerns.    Cough, Adult  A cough is a reflex that helps clear your throat and airways. It can help heal the body or may be a reaction to an irritated airway. A cough may only last 2 or 3 weeks (acute) or may last more than 8 weeks (chronic).  CAUSES Acute cough:  Viral or bacterial infections. Chronic cough:  Infections.  Allergies.  Asthma.  Post-nasal drip.  Smoking.  Heartburn or acid reflux.  Some medicines.  Chronic lung problems (COPD).  Cancer. SYMPTOMS   Cough.  Fever.  Chest pain.  Increased breathing rate.  High-pitched whistling sound when breathing (wheezing).  Colored mucus that you cough up (sputum). TREATMENT   A bacterial cough may be treated with antibiotic medicine.  A viral cough must run  its course and will not respond to antibiotics.  Your caregiver may recommend other treatments if you have a chronic cough. HOME CARE INSTRUCTIONS   Only take over-the-counter or prescription medicines for pain, discomfort, or fever as directed by your caregiver. Use cough suppressants only as directed by your caregiver.  Use a cold steam vaporizer or humidifier in your bedroom or home to help loosen secretions.  Sleep in a semi-upright position if your cough is worse at night.  Rest as needed.  Stop  smoking if you smoke. SEEK IMMEDIATE MEDICAL CARE IF:   You have pus in your sputum.  Your cough starts to worsen.  You cannot control your cough with suppressants and are losing sleep.  You begin coughing up blood.  You have difficulty breathing.  You develop pain which is getting worse or is uncontrolled with medicine.  You have a fever. MAKE SURE YOU:   Understand these instructions.  Will watch your condition.  Will get help right away if you are not doing well or get worse. Document Released: 03/03/2011 Document Revised: 11/27/2011 Document Reviewed: 03/03/2011 [facility] Patient Information 2013 [place].

## 2012-09-15 NOTE — Patient Instructions (Addendum)
  Take Zantac Over the counter one pill 2x a day to see if this is helpful for the cough/acid reflux use the cough syrup at night if needed. If you develop fever shortness of breath or worsening of the cough return to clinic for additional studies. Please call us if you have any questions or concerns.    Cough, Adult  A cough is a reflex that helps clear your throat and airways. It can help heal the body or may be a reaction to an irritated airway. A cough may only last 2 or 3 weeks (acute) or may last more than 8 weeks (chronic).  CAUSES Acute cough:  Viral or bacterial infections. Chronic cough:  Infections.  Allergies.  Asthma.  Post-nasal drip.  Smoking.  Heartburn or acid reflux.  Some medicines.  Chronic lung problems (COPD).  Cancer. SYMPTOMS   Cough.  Fever.  Chest pain.  Increased breathing rate.  High-pitched whistling sound when breathing (wheezing).  Colored mucus that you cough up (sputum). TREATMENT   A bacterial cough may be treated with antibiotic medicine.  A viral cough must run its course and will not respond to antibiotics.  Your caregiver may recommend other treatments if you have a chronic cough. HOME CARE INSTRUCTIONS   Only take over-the-counter or prescription medicines for pain, discomfort, or fever as directed by your caregiver. Use cough suppressants only as directed by your caregiver.  Use a cold steam vaporizer or humidifier in your bedroom or home to help loosen secretions.  Sleep in a semi-upright position if your cough is worse at night.  Rest as needed.  Stop smoking if you smoke. SEEK IMMEDIATE MEDICAL CARE IF:   You have pus in your sputum.  Your cough starts to worsen.  You cannot control your cough with suppressants and are losing sleep.  You begin coughing up blood.  You have difficulty breathing.  You develop pain which is getting worse or is uncontrolled with medicine.  You have a fever. MAKE SURE YOU:     Understand these instructions.  Will watch your condition.  Will get help right away if you are not doing well or get worse. Document Released: 03/03/2011 Document Revised: 11/27/2011 Document Reviewed: 03/03/2011 [facility] Patient Information 2013 [place].

## 2012-09-26 DIAGNOSIS — H4010X0 Unspecified open-angle glaucoma, stage unspecified: Secondary | ICD-10-CM | POA: Diagnosis not present

## 2012-09-26 DIAGNOSIS — E109 Type 1 diabetes mellitus without complications: Secondary | ICD-10-CM | POA: Diagnosis not present

## 2012-09-26 DIAGNOSIS — H2510 Age-related nuclear cataract, unspecified eye: Secondary | ICD-10-CM | POA: Diagnosis not present

## 2012-09-29 ENCOUNTER — Other Ambulatory Visit: Payer: Self-pay | Admitting: Physician Assistant

## 2012-10-15 ENCOUNTER — Ambulatory Visit (INDEPENDENT_AMBULATORY_CARE_PROVIDER_SITE_OTHER): Payer: Medicare Other | Admitting: Family Medicine

## 2012-10-15 VITALS — BP 166/88 | HR 71 | Temp 98.4°F | Resp 17 | Ht 69.0 in | Wt 169.0 lb

## 2012-10-15 DIAGNOSIS — H6090 Unspecified otitis externa, unspecified ear: Secondary | ICD-10-CM

## 2012-10-15 DIAGNOSIS — E119 Type 2 diabetes mellitus without complications: Secondary | ICD-10-CM

## 2012-10-15 DIAGNOSIS — H60399 Other infective otitis externa, unspecified ear: Secondary | ICD-10-CM | POA: Diagnosis not present

## 2012-10-15 MED ORDER — NEOMYCIN-POLYMYXIN-HC 3.5-10000-1 OT SOLN: 3.0000 [drp] | Freq: Four times a day (QID) | OTIC | Status: DC

## 2012-10-15 NOTE — Progress Notes (Signed)
  Subjective:    Patient ID: Bryan Knight, male    DOB: 1945/10/11, 67 y.o.   MRN: [id number]  HPI  67 year old male here with right ear pain.  Also needs a prescription for one touch verio o 1Q lancets and test strips.  Sugar has gone up and not sure if it is due to old machine or if sugar is actually running high.    BP has always been high and has strong family hx of hypertension, yet parents lived into their late 36's  BS's 130-150 Review of Systems No foot pain or ulcers, headache, chest pain,     Objective:   Physical Exam BP recheck 178/90 No swelling in extremities Right ear has yellow exudates and mild erythema (he is wearing aids)     Assessment & Plan:  Patient continues to have high blood pressure that has not been controlloable without severe side effects  1. Otitis externa  neomycin-polymyxin-hydrocortisone (CORTISPORIN) otic solution

## 2012-11-24 ENCOUNTER — Other Ambulatory Visit: Payer: Self-pay | Admitting: Physician Assistant

## 2012-12-12 ENCOUNTER — Other Ambulatory Visit: Payer: Self-pay | Admitting: Physician Assistant

## 2013-01-04 ENCOUNTER — Other Ambulatory Visit: Payer: Self-pay | Admitting: Physician Assistant

## 2013-01-15 ENCOUNTER — Ambulatory Visit (INDEPENDENT_AMBULATORY_CARE_PROVIDER_SITE_OTHER): Payer: Medicare Other | Admitting: Family Medicine

## 2013-01-15 ENCOUNTER — Other Ambulatory Visit: Payer: Self-pay | Admitting: *Deleted

## 2013-01-15 VITALS — BP 160/85 | HR 70 | Temp 98.5°F | Resp 16 | Ht 69.0 in | Wt 172.0 lb

## 2013-01-15 DIAGNOSIS — E1149 Type 2 diabetes mellitus with other diabetic neurological complication: Secondary | ICD-10-CM | POA: Diagnosis not present

## 2013-01-15 DIAGNOSIS — J302 Other seasonal allergic rhinitis: Secondary | ICD-10-CM

## 2013-01-15 DIAGNOSIS — I10 Essential (primary) hypertension: Secondary | ICD-10-CM | POA: Diagnosis not present

## 2013-01-15 DIAGNOSIS — J309 Allergic rhinitis, unspecified: Secondary | ICD-10-CM | POA: Diagnosis not present

## 2013-01-15 DIAGNOSIS — E039 Hypothyroidism, unspecified: Secondary | ICD-10-CM | POA: Diagnosis not present

## 2013-01-15 LAB — POCT CBC
Granulocyte percent: 80.9 %G — AB (ref 37–80)
HCT, POC: 45.4 % (ref 43.5–53.7)
Hemoglobin: 14.3 g/dL (ref 14.1–18.1)
Lymph, poc: 1.6 (ref 0.6–3.4)
MCH, POC: 27.9 pg (ref 27–31.2)
MCHC: 31.5 g/dL — AB (ref 31.8–35.4)
MCV: 88.6 fL (ref 80–97)
MID (cbc): 0.9 (ref 0–0.9)
MPV: 8.1 fL (ref 0–99.8)
POC Granulocyte: 10.4 — AB (ref 2–6.9)
POC LYMPH PERCENT: 12.4 %L (ref 10–50)
POC MID %: 6.7 %M (ref 0–12)
Platelet Count, POC: 288 10*3/uL (ref 142–424)
RBC: 5.12 M/uL (ref 4.69–6.13)
RDW, POC: 14.1 %
WBC: 12.9 10*3/uL — AB (ref 4.6–10.2)

## 2013-01-15 LAB — POCT GLYCOSYLATED HEMOGLOBIN (HGB A1C): Hemoglobin A1C: 5.8

## 2013-01-15 MED ORDER — LISINOPRIL-HYDROCHLOROTHIAZIDE 20-12.5 MG PO TABS: 1.0000 | ORAL_TABLET | Freq: Every day | ORAL | Status: AC

## 2013-01-15 MED ORDER — SITAGLIPTIN PHOS-METFORMIN HCL 50-1000 MG PO TABS: 1.0000 | ORAL_TABLET | Freq: Two times a day (BID) | ORAL | Status: DC

## 2013-01-15 MED ORDER — AMLODIPINE BESYLATE 10 MG PO TABS: 10.0000 mg | ORAL_TABLET | Freq: Every day | ORAL | Status: DC

## 2013-01-15 MED ORDER — LEVOTHYROXINE SODIUM 25 MCG PO TABS: 25.0000 ug | ORAL_TABLET | Freq: Every day | ORAL | Status: DC

## 2013-01-15 MED ORDER — SITAGLIPTIN PHOS-METFORMIN HCL 50-1000 MG PO TABS: 1.0000 | ORAL_TABLET | Freq: Every day | ORAL | Status: DC

## 2013-01-15 NOTE — Progress Notes (Signed)
Patient ID: DELRAY RANDEL MRN: [id number], DOB: January 07, 1946, 67 y.o. Date of Encounter: 01/15/2013, 4:02 PM  Primary Physician: [name], MD  Chief Complaint: HTN  HPI: 67 y.o. year old male with history below presents for hypertension followup Patient has been noting increasing sore throat, sneezing, and congestion for 4 days.  The symptoms are worsening despite claritin which did nothing.  Associated with some hoarseness and cough.   Tried coricidin hbp which did help.  Blood sugars have been in the 140 range.   Past Medical History  Diagnosis Date  . Hypertension   . Hearing loss   . Depression   . Diabetes mellitus   . Rosacea   . Thyroid disease   . Hyperlipidemia   . Cataract   . Glaucoma      Home Meds: Prior to Admission medications   Medication Sig Start Date End Date Taking? Authorizing Provider  amLODipine (NORVASC) 10 MG tablet Take 1 tablet (10 mg total) by mouth daily. Needs office visit 01/04/13  Yes Collene Leyden, PA-C  aspirin 81 MG tablet Take 160 mg by mouth daily.   Yes Historical Provider, MD  DULoxetine (CYMBALTA) 30 MG capsule Take 1 capsule (30 mg total) by mouth daily. 01/11/12 01/15/13 Yes [name], MD  JANUMET 50-1000 MG per tablet TAKE 1 TABLET TWICE A DAY  WITH MEALS 12/04/11  Yes Chelle S Jeffery, PA-C  latanoprost (XALATAN) 0.005 % ophthalmic solution Place 1 drop into both eyes at bedtime.   Yes Historical Provider, MD  levothyroxine (SYNTHROID, LEVOTHROID) 25 MCG tablet Take 1 tablet (25 mcg total) by mouth daily. 01/11/12  Yes [name], MD  lisinopril-hydrochlorothiazide (PRINZIDE,ZESTORETIC) 20-12.5 MG per tablet Take 1 tablet by mouth 2 (two) times daily. 08/05/12 08/05/13 Yes Thao P Le, DO  metoprolol (LOPRESSOR) 100 MG tablet Take 1 tablet (100 mg total) by mouth 2 (two) times daily. Needs office visit 12/12/12  Yes Collene Leyden, PA-C    Allergies:  Allergies  Allergen Reactions  . Citalopram   . Wellbutrin  (Bupropion Hcl) Other (See Comments)    Sluggish     History   Social History  . Marital Status: Married    Spouse Name: N/A    Number of Children: N/A  . Years of Education: N/A   Occupational History  . Not on file.   Social History Main Topics  . Smoking status: Former Smoker    Types: Cigarettes    Quit date: 10/18/1967  . Smokeless tobacco: Not on file  . Alcohol Use: No  . Drug Use: No  . Sexually Active: Not on file     Comment: married   Other Topics Concern  . Not on file   Social History Narrative  . No narrative on file     Family History  Problem Relation Age of Onset  . Hypertension Mother   . Cancer Mother     Review of Systems: Constitutional: negative for chills, fever, night sweats, weight changes, or fatigue  HEENT: negative for vision changes, hearing loss, congestion, rhinorrhea, ST, epistaxis, or sinus pressure Cardiovascular: negative for chest pain, palpitations, or DOE Respiratory: negative for hemoptysis, wheezing, shortness of breath, or cough Abdominal: negative for abdominal pain, nausea, vomiting, diarrhea, or constipation Dermatological: negative for rash Neurologic: negative for headache, dizziness, or syncope All other systems reviewed and are otherwise negative with the exception to those above and in the HPI.   Physical Exam: Blood pressure 160/85, pulse 70, temperature 98.5 F (  36.9 C), resp. rate 16, height 5\' 9"  (1.753 m), weight 172 lb (78.019 kg), SpO2 98.00%., Body mass index is 25.39 kg/(m^2). General: Well developed, well nourished, in no acute distress. Head: Normocephalic, atraumatic, eyes without discharge, sclera non-icteric, nares are without discharge. Bilateral auditory canals clear, TM's are without perforation, pearly grey and translucent with reflective cone of light bilaterally. Oral cavity moist, posterior pharynx without exudate, erythema, peritonsillar abscess, or post nasal drip.  Neck: Supple. No  thyromegaly. Full ROM. No lymphadenopathy. No carotid bruits. Lungs: Clear bilaterally to auscultation without wheezes, rales, or rhonchi. Breathing is unlabored. Heart: RRR with S1 S2. No murmurs, rubs, or gallops appreciated.  Abdomen: Soft, non-tender, non-distended with normoactive bowel sounds. No hepatosplenomegaly. No rebound/guarding. No obvious abdominal masses. Msk:  Strength and tone normal for age. Extremities/Skin: Warm and dry. No clubbing or cyanosis. No edema. No rashes or suspicious lesions. Distal pulses 2+ and equal bilaterally.  Mild scaling soles of feet Neuro: Alert and oriented X 3. Moves all extremities spontaneously. Gait is normal. CNII-XII grossly in tact. DTR 2+, cerebellar function intact. [place] normal. Psych:  Responds to questions appropriately with a normal affect.   Labs:  Monofilament normal  CMP pending  ASSESSMENT AND PLAN:  67 y.o. year old male with seasonal allergies Type II or unspecified type diabetes mellitus with neurological manifestations, not stated as uncontrolled(250.60) - Plan: POCT glycosylated hemoglobin (Hb A1C), Comprehensive metabolic panel, POCT CBC, Lipid panel, sitaGLIPtan-metformin (JANUMET) 50-1000 MG per tablet, DISCONTINUED: sitaGLIPtan-metformin (JANUMET) 50-1000 MG per tablet  Unspecified hypothyroidism - Plan: TSH, levothyroxine (SYNTHROID, LEVOTHROID) 25 MCG tablet  Hypertension - Plan: lisinopril-hydrochlorothiazide (PRINZIDE,ZESTORETIC) 20-12.5 MG per tablet, amLODipine (NORVASC) 10 MG tablet  -  Signed, [name], MD 01/15/2013 4:02 PM

## 2013-01-16 ENCOUNTER — Other Ambulatory Visit: Payer: Self-pay | Admitting: Family Medicine

## 2013-01-16 DIAGNOSIS — E039 Hypothyroidism, unspecified: Secondary | ICD-10-CM

## 2013-01-16 LAB — COMPREHENSIVE METABOLIC PANEL
ALT: 9 U/L (ref 0–53)
AST: 11 U/L (ref 0–37)
Albumin: 4.5 g/dL (ref 3.5–5.2)
Alkaline Phosphatase: 55 U/L (ref 39–117)
BUN: 25 mg/dL — ABNORMAL HIGH (ref 6–23)
CO2: 32 mEq/L (ref 19–32)
Calcium: 9.7 mg/dL (ref 8.4–10.5)
Chloride: 93 mEq/L — ABNORMAL LOW (ref 96–112)
Creat: 1.45 mg/dL — ABNORMAL HIGH (ref 0.50–1.35)
Glucose, Bld: 103 mg/dL — ABNORMAL HIGH (ref 70–99)
Potassium: 3.5 mEq/L (ref 3.5–5.3)
Sodium: 135 mEq/L (ref 135–145)
Total Bilirubin: 0.6 mg/dL (ref 0.3–1.2)
Total Protein: 7.3 g/dL (ref 6.0–8.3)

## 2013-01-16 LAB — LIPID PANEL
Cholesterol: 192 mg/dL (ref 0–200)
HDL: 46 mg/dL (ref >39)
LDL Cholesterol: 99 mg/dL (ref 0–99)
Total CHOL/HDL Ratio: 4.2 Ratio
Triglycerides: 236 mg/dL — ABNORMAL HIGH (ref <150)
VLDL: 47 mg/dL — ABNORMAL HIGH (ref 0–40)

## 2013-01-16 LAB — TSH: TSH: 4.598 u[IU]/mL — ABNORMAL HIGH (ref 0.350–4.500)

## 2013-01-16 MED ORDER — LEVOTHYROXINE SODIUM 50 MCG PO TABS: 50.0000 ug | ORAL_TABLET | Freq: Every day | ORAL | Status: DC

## 2013-01-26 ENCOUNTER — Telehealth: Payer: Self-pay

## 2013-01-26 NOTE — Telephone Encounter (Signed)
Spoke with patient and he was confused because he received new rx from mail order for .5mg  synthroid. I explained the difference and that Dr. Joseph Art sent that one in so it would be 1 once daily. Voiced understanding.

## 2013-01-26 NOTE — Telephone Encounter (Signed)
PATIENT WOULD LIKE DR L TO CALL HIM ABOUT HIS MEDICATIONS PLEASE CALL AT [phone]

## 2013-02-02 ENCOUNTER — Other Ambulatory Visit: Payer: Self-pay | Admitting: Physician Assistant

## 2013-03-13 ENCOUNTER — Encounter: Payer: Self-pay | Admitting: Family Medicine

## 2013-03-13 ENCOUNTER — Ambulatory Visit (INDEPENDENT_AMBULATORY_CARE_PROVIDER_SITE_OTHER): Payer: Medicare Other | Admitting: Family Medicine

## 2013-03-13 VITALS — BP 144/80 | HR 59 | Temp 97.9°F | Resp 16 | Ht 68.5 in | Wt 172.0 lb

## 2013-03-13 DIAGNOSIS — L820 Inflamed seborrheic keratosis: Secondary | ICD-10-CM

## 2013-03-13 DIAGNOSIS — E119 Type 2 diabetes mellitus without complications: Secondary | ICD-10-CM | POA: Diagnosis not present

## 2013-03-13 DIAGNOSIS — E039 Hypothyroidism, unspecified: Secondary | ICD-10-CM

## 2013-03-13 DIAGNOSIS — L989 Disorder of the skin and subcutaneous tissue, unspecified: Secondary | ICD-10-CM | POA: Diagnosis not present

## 2013-03-13 LAB — TSH: TSH: 2.726 u[IU]/mL (ref 0.350–4.500)

## 2013-03-13 LAB — POCT GLYCOSYLATED HEMOGLOBIN (HGB A1C): Hemoglobin A1C: 6

## 2013-03-13 NOTE — Progress Notes (Signed)
67 yo man with diabetes and hypertension followup Also has intermittently painful, slow growing right lower quadrant skin lesion.  No change in clinical symptoms otherwise.   Notes blood sugars are 110-150 range  Objective:  NAD, pleasant and articulate HEENT:  Unremarkable Chest:  Clear Heart:  Reg, no murmur Abdomen:  Soft, nontender, no HSM or mass Skin:  5 mm lesion right lower quadrant most c/w seborrheic keratosis  Betadine prep  1%xylo with epi local  1.5 cm elliptical excision, sent for biopsy  2 x 4-0 horizontal mattress closure  No complications  Sterile dressing Results for orders placed in visit on 03/13/13  POCT GLYCOSYLATED HEMOGLOBIN (HGB A1C)      Result Value Range   Hemoglobin A1C 6.0      Assessment:  Reasonable blood sugar control.  Blood pressure also reasonable.  Skin lesion should be benign  Type 2 diabetes mellitus - Plan: POCT glycosylated hemoglobin (Hb A1C)  Unspecified hypothyroidism - Plan: TSH  Skin lesion - Plan: Dermatology pathology  Signed, [name], MD  Results for orders placed in visit on 01/15/13  COMPREHENSIVE METABOLIC PANEL      Result Value Range   Sodium 135  135 - 145 mEq/L   Potassium 3.5  3.5 - 5.3 mEq/L   Chloride 93 (*) 96 - 112 mEq/L   CO2 32  19 - 32 mEq/L   Glucose, Bld 103 (*) 70 - 99 mg/dL   BUN 25 (*) 6 - 23 mg/dL   Creat 1.45 (*) 0.50 - 1.35 mg/dL   Total Bilirubin 0.6  0.3 - 1.2 mg/dL   Alkaline Phosphatase 55  39 - 117 U/L   AST 11  0 - 37 U/L   ALT 9  0 - 53 U/L   Total Protein 7.3  6.0 - 8.3 g/dL   Albumin 4.5  3.5 - 5.2 g/dL   Calcium 9.7  8.4 - 10.5 mg/dL  LIPID PANEL      Result Value Range   Cholesterol 192  0 - 200 mg/dL   Triglycerides 236 (*) <150 mg/dL   HDL 46  >39 mg/dL   Total CHOL/HDL Ratio 4.2     VLDL 47 (*) 0 - 40 mg/dL   LDL Cholesterol 99  0 - 99 mg/dL  TSH      Result Value Range   TSH 4.598 (*) 0.350 - 4.500 uIU/mL  POCT GLYCOSYLATED HEMOGLOBIN (HGB A1C)      Result  Value Range   Hemoglobin A1C 5.8    POCT CBC      Result Value Range   WBC 12.9 (*) 4.6 - 10.2 K/uL   Lymph, poc 1.6  0.6 - 3.4   POC LYMPH PERCENT 12.4  10 - 50 %L   MID (cbc) 0.9  0 - 0.9   POC MID % 6.7  0 - 12 %M   POC Granulocyte 10.4 (*) 2 - 6.9   Granulocyte percent 80.9 (*) 37 - 80 %G   RBC 5.12  4.69 - 6.13 M/uL   Hemoglobin 14.3  14.1 - 18.1 g/dL   HCT, POC 45.4  43.5 - 53.7 %   MCV 88.6  80 - 97 fL   MCH, POC 27.9  27 - 31.2 pg   MCHC 31.5 (*) 31.8 - 35.4 g/dL   RDW, POC 14.1     Platelet Count, POC 288  142 - 424 K/uL   MPV 8.1  0 - 99.8 fL

## 2013-03-13 NOTE — Patient Instructions (Addendum)
  Results for orders placed in visit on 01/15/13  COMPREHENSIVE METABOLIC PANEL      Result Value Range   Sodium 135  135 - 145 mEq/L   Potassium 3.5  3.5 - 5.3 mEq/L   Chloride 93 (*) 96 - 112 mEq/L   CO2 32  19 - 32 mEq/L   Glucose, Bld 103 (*) 70 - 99 mg/dL   BUN 25 (*) 6 - 23 mg/dL   Creat 1.45 (*) 0.50 - 1.35 mg/dL   Total Bilirubin 0.6  0.3 - 1.2 mg/dL   Alkaline Phosphatase 55  39 - 117 U/L   AST 11  0 - 37 U/L   ALT 9  0 - 53 U/L   Total Protein 7.3  6.0 - 8.3 g/dL   Albumin 4.5  3.5 - 5.2 g/dL   Calcium 9.7  8.4 - 10.5 mg/dL  LIPID PANEL      Result Value Range   Cholesterol 192  0 - 200 mg/dL   Triglycerides 236 (*) <150 mg/dL   HDL 46  >39 mg/dL   Total CHOL/HDL Ratio 4.2     VLDL 47 (*) 0 - 40 mg/dL   LDL Cholesterol 99  0 - 99 mg/dL  TSH      Result Value Range   TSH 4.598 (*) 0.350 - 4.500 uIU/mL  POCT GLYCOSYLATED HEMOGLOBIN (HGB A1C)      Result Value Range   Hemoglobin A1C 5.8    POCT CBC      Result Value Range   WBC 12.9 (*) 4.6 - 10.2 K/uL   Lymph, poc 1.6  0.6 - 3.4   POC LYMPH PERCENT 12.4  10 - 50 %L   MID (cbc) 0.9  0 - 0.9   POC MID % 6.7  0 - 12 %M   POC Granulocyte 10.4 (*) 2 - 6.9   Granulocyte percent 80.9 (*) 37 - 80 %G   RBC 5.12  4.69 - 6.13 M/uL   Hemoglobin 14.3  14.1 - 18.1 g/dL   HCT, POC 45.4  43.5 - 53.7 %   MCV 88.6  80 - 97 fL   MCH, POC 27.9  27 - 31.2 pg   MCHC 31.5 (*) 31.8 - 35.4 g/dL   RDW, POC 14.1     Platelet Count, POC 288  142 - 424 K/uL   MPV 8.1  0 - 99.8 fL   Results for orders placed in visit on 03/13/13  POCT GLYCOSYLATED HEMOGLOBIN (HGB A1C)      Result Value Range   Hemoglobin A1C 6.0

## 2013-03-19 ENCOUNTER — Telehealth: Payer: Self-pay

## 2013-03-19 NOTE — Telephone Encounter (Signed)
Called and notified pt of benign result of pathology report.

## 2013-03-20 ENCOUNTER — Encounter: Payer: Self-pay | Admitting: Family Medicine

## 2013-03-20 ENCOUNTER — Ambulatory Visit (INDEPENDENT_AMBULATORY_CARE_PROVIDER_SITE_OTHER): Payer: Medicare Other | Admitting: Family Medicine

## 2013-03-20 DIAGNOSIS — L820 Inflamed seborrheic keratosis: Secondary | ICD-10-CM

## 2013-03-20 NOTE — Progress Notes (Signed)
67 year old gentleman here to remove sutures from the biopsy site done last week.  Objective: The wound is well-healed. Pathology was discussed with the patient and showed seborrheic keratosis, irritated. 2 sutures removed without incident  Assessment: Good healing following benign skin lesion removal.

## 2013-04-12 ENCOUNTER — Encounter: Payer: Self-pay | Admitting: Internal Medicine

## 2013-04-12 ENCOUNTER — Ambulatory Visit (INDEPENDENT_AMBULATORY_CARE_PROVIDER_SITE_OTHER): Payer: Medicare Other | Admitting: Internal Medicine

## 2013-04-12 VITALS — BP 163/86 | HR 56 | Temp 97.6°F | Resp 16 | Ht 68.5 in | Wt 172.0 lb

## 2013-04-12 DIAGNOSIS — L03119 Cellulitis of unspecified part of limb: Secondary | ICD-10-CM | POA: Diagnosis not present

## 2013-04-12 DIAGNOSIS — L02419 Cutaneous abscess of limb, unspecified: Secondary | ICD-10-CM

## 2013-04-12 MED ORDER — DOXYCYCLINE HYCLATE 100 MG PO TABS: 100.0000 mg | ORAL_TABLET | Freq: Two times a day (BID) | ORAL | Status: DC

## 2013-04-12 NOTE — Progress Notes (Signed)
  Subjective:    Patient ID: Bryan Knight, male    DOB: 1946/03/04, 67 y.o.   MRN: [id number]  HPInotedred tender lesion R lower leg today--NKI//no known bite No fever  Patient Active Problem List   Diagnosis Date Noted  . B12 deficiency 01/11/2012  . Hypertension 10/20/2011  . Diabetes mellitus type II, controlled 10/20/2011  . Hypothyroid 10/20/2011  . Rosacea 10/20/2011    Review of Systems     Objective:   Physical Exam BP 163/86  Pulse 56  Temp(Src) 97.6 F (36.4 C)  Resp 16  Ht 5' 8.5" (1.74 m)  Wt 172 lb (78.019 kg)  BMI 25.77 kg/m2 Right lower leg with a 3 cm x 2 cm red indurated area with central scabbing and one small area pustular/slightly tender       Assessment & Plan:  Cellulitis leg  Culture Start doxy Keep clean

## 2013-04-14 DIAGNOSIS — E109 Type 1 diabetes mellitus without complications: Secondary | ICD-10-CM | POA: Diagnosis not present

## 2013-04-14 DIAGNOSIS — H4010X0 Unspecified open-angle glaucoma, stage unspecified: Secondary | ICD-10-CM | POA: Diagnosis not present

## 2013-04-14 DIAGNOSIS — H2510 Age-related nuclear cataract, unspecified eye: Secondary | ICD-10-CM | POA: Diagnosis not present

## 2013-04-21 ENCOUNTER — Ambulatory Visit (INDEPENDENT_AMBULATORY_CARE_PROVIDER_SITE_OTHER): Payer: Medicare Other | Admitting: Emergency Medicine

## 2013-04-21 VITALS — BP 144/86 | HR 60 | Temp 97.5°F | Resp 16 | Ht 69.0 in | Wt 172.0 lb

## 2013-04-21 DIAGNOSIS — L03119 Cellulitis of unspecified part of limb: Secondary | ICD-10-CM | POA: Diagnosis not present

## 2013-04-21 DIAGNOSIS — L02419 Cutaneous abscess of limb, unspecified: Secondary | ICD-10-CM

## 2013-04-21 MED ORDER — CLINDAMYCIN HCL 300 MG PO CAPS: 300.0000 mg | ORAL_CAPSULE | Freq: Three times a day (TID) | ORAL | Status: DC

## 2013-04-21 NOTE — Progress Notes (Signed)
[facility] [address] [place] [address] [phone]  Date:  04/21/2013   Name:  Bryan Knight   DOB:  04/23/1946   MRN:  [id number]  PCP:  [name], MD    Chief Complaint: Leg Injury   History of Present Illness:  Bryan Knight is a 67 y.o. very pleasant male patient who presents with the following:  Seen and put on doxycycline and has had no improvement.  Has a lesion on the lateral RIGHT lower leg.  Culture reviewed.  Denies other complaint or health concern today.   Patient Active Problem List   Diagnosis Date Noted  . B12 deficiency 01/11/2012  . Hypertension 10/20/2011  . Diabetes mellitus type II, controlled 10/20/2011  . Hypothyroid 10/20/2011  . Rosacea 10/20/2011    Past Medical History  Diagnosis Date  . Hypertension   . Hearing loss   . Depression   . Diabetes mellitus   . Rosacea   . Thyroid disease   . Hyperlipidemia   . Cataract   . Glaucoma     Past Surgical History  Procedure Laterality Date  . Eye surgery    . Tonsillectomy      over 50 yrs ago    History  Substance Use Topics  . Smoking status: Former Smoker    Types: Cigarettes    Quit date: 10/18/1967  . Smokeless tobacco: Not on file  . Alcohol Use: No    Family History  Problem Relation Age of Onset  . Hypertension Mother   . Cancer Mother     Allergies  Allergen Reactions  . Citalopram   . Wellbutrin (Bupropion Hcl) Other (See Comments)    Sluggish     Medication list has been reviewed and updated.  Current Outpatient Prescriptions on File Prior to Visit  Medication Sig Dispense Refill  . amLODipine (NORVASC) 10 MG tablet Take 1 tablet (10 mg total) by mouth daily. Needs office visit  90 tablet  3  . aspirin 81 MG tablet Take 160 mg by mouth daily.      Marland Kitchen doxycycline (VIBRA-TABS) 100 MG tablet Take 1 tablet (100 mg total) by mouth 2 (two) times daily.  20 tablet  0  . latanoprost (XALATAN) 0.005 % ophthalmic solution Place 1 drop  into both eyes at bedtime.      Marland Kitchen levothyroxine (SYNTHROID, LEVOTHROID) 50 MCG tablet Take 1 tablet (50 mcg total) by mouth daily.  90 tablet  3  . lisinopril-hydrochlorothiazide (PRINZIDE,ZESTORETIC) 20-12.5 MG per tablet Take 1 tablet by mouth daily.  90 tablet  3  . metoprolol (LOPRESSOR) 100 MG tablet TAKE ONE TABLET BY MOUTH TWICE DAILY  60 tablet  2  . sitaGLIPtan-metformin (JANUMET) 50-1000 MG per tablet Take 1 tablet by mouth 2 (two) times daily with a meal.  180 tablet  3  . DULoxetine (CYMBALTA) 30 MG capsule Take 1 capsule (30 mg total) by mouth daily.  90 capsule  3   No current facility-administered medications on file prior to visit.    Review of Systems:  As per HPI, otherwise negative.    Physical Examination: Filed Vitals:   04/21/13 1225  BP: 144/86  Pulse: 60  Temp: 97.5 F (36.4 C)  Resp: 16   Filed Vitals:   04/21/13 1225  Height: 5\' 9"  (1.753 m)  Weight: 172 lb (78.019 kg)   Body mass index is 25.39 kg/(m^2). Ideal Body Weight: Weight in (lb) to have BMI = 25: 168.9  GEN: WDWN, NAD, Non-toxic, Alert & Oriented x 3 HEENT: Atraumatic, Normocephalic.  Ears and Nose: No external deformity. EXTR: No clubbing/cyanosis/edema NEURO: Normal gait.  PSYCH: Normally interactive. Conversant. Not depressed or anxious appearing.  Calm demeanor.  Ulcerated pustule right lateral mid calf.  Some surrounding cellulitis.    Assessment and Plan: Cellulitis and abscess calf Unroofed and cultured Change to clinda Warm soaks    Signed,  Ellison Carwin, MD

## 2013-04-21 NOTE — Patient Instructions (Addendum)
Abscess An abscess is an infected area that contains a collection of pus and debris.It can occur in almost any part of the body. An abscess is also known as a furuncle or boil. CAUSES  An abscess occurs when tissue gets infected. This can occur from blockage of oil or sweat glands, infection of hair follicles, or a minor injury to the skin. As the body tries to fight the infection, pus collects in the area and creates pressure under the skin. This pressure causes pain. People with weakened immune systems have difficulty fighting infections and get certain abscesses more often.  SYMPTOMS Usually an abscess develops on the skin and becomes a painful mass that is red, warm, and tender. If the abscess forms under the skin, you may feel a moveable soft area under the skin. Some abscesses break open (rupture) on their own, but most will continue to get worse without care. The infection can spread deeper into the body and eventually into the bloodstream, causing you to feel ill.  DIAGNOSIS  Your caregiver will take your medical history and perform a physical exam. A sample of fluid may also be taken from the abscess to determine what is causing your infection. TREATMENT  Your caregiver may prescribe antibiotic medicines to fight the infection. However, taking antibiotics alone usually does not cure an abscess. Your caregiver may need to make a small cut (incision) in the abscess to drain the pus. In some cases, gauze is packed into the abscess to reduce pain and to continue draining the area. HOME CARE INSTRUCTIONS   Only take over-the-counter or prescription medicines for pain, discomfort, or fever as directed by your caregiver.  If you were prescribed antibiotics, take them as directed. Finish them even if you start to feel better.  If gauze is used, follow your caregiver's directions for changing the gauze.  To avoid spreading the infection:  Keep your draining abscess covered with a  bandage.  Wash your hands well.  Do not share personal care items, towels, or whirlpools with others.  Avoid skin contact with others.  Keep your skin and clothes clean around the abscess.  Keep all follow-up appointments as directed by your caregiver. SEEK MEDICAL CARE IF:   You have increased pain, swelling, redness, fluid drainage, or bleeding.  You have muscle aches, chills, or a general ill feeling.  You have a fever. MAKE SURE YOU:   Understand these instructions.  Will watch your condition.  Will get help right away if you are not doing well or get worse. Document Released: 06/14/2005 Document Revised: 03/05/2012 Document Reviewed: 11/17/2011 Exit[employer] Patient Information 2014 [employer].  

## 2013-04-24 LAB — WOUND CULTURE: Gram Stain: NONE SEEN

## 2013-04-25 ENCOUNTER — Other Ambulatory Visit: Payer: Self-pay | Admitting: Family Medicine

## 2013-04-29 ENCOUNTER — Telehealth: Payer: Self-pay | Admitting: Radiology

## 2013-04-29 MED ORDER — DULOXETINE HCL 30 MG PO CPEP: ORAL_CAPSULE | ORAL | Status: DC

## 2013-04-29 NOTE — Telephone Encounter (Signed)
Ok to refill for 9 months

## 2013-04-29 NOTE — Telephone Encounter (Signed)
Please advise. Patient asking for 3 refills on the Cymbalta/ you sent in 90 day supply

## 2013-05-30 ENCOUNTER — Other Ambulatory Visit: Payer: Self-pay | Admitting: Physician Assistant

## 2013-06-02 DIAGNOSIS — Z23 Encounter for immunization: Secondary | ICD-10-CM | POA: Diagnosis not present

## 2013-06-18 ENCOUNTER — Ambulatory Visit (INDEPENDENT_AMBULATORY_CARE_PROVIDER_SITE_OTHER): Payer: Medicare Other | Admitting: Family Medicine

## 2013-06-18 VITALS — BP 122/80 | HR 62 | Temp 98.0°F | Resp 17 | Ht 70.0 in | Wt 173.0 lb

## 2013-06-18 DIAGNOSIS — J309 Allergic rhinitis, unspecified: Secondary | ICD-10-CM

## 2013-06-18 MED ORDER — AMOXICILLIN 500 MG PO CAPS: 1000.0000 mg | ORAL_CAPSULE | Freq: Two times a day (BID) | ORAL | Status: DC

## 2013-06-18 MED ORDER — FLUTICASONE PROPIONATE 50 MCG/ACT NA SUSP: 2.0000 | Freq: Every day | NASAL | Status: DC

## 2013-06-18 NOTE — Progress Notes (Signed)
[place], [place]  [address]   [phone]  Subjective:    Patient ID: Bryan Knight, male    DOB: 17-Dec-1945, 67 y.o.   MRN: [id number]  HPI This 67 y.o. male presents for evaluation of congstion. Onset three days ago.  No fever/chills/sweats.  No myalgias; +malaise.  No headache.  No ear pain or sore throat.  +nasal congestion; +coughing.  Ibuprofen. +PND.  Not sure of color.  Sneezing.  No itchy eyes or nose.  No allergic rhinitis this time of year.  +sinus pressure.  +sick contacts; grandson and son.  Sugars running well.    PCP: [place].   Review of Systems  Constitutional: Negative for fever, chills, diaphoresis and fatigue.  HENT: Positive for congestion, rhinorrhea, sneezing, voice change, postnasal drip and sinus pressure. Negative for ear pain, sore throat and trouble swallowing.   Respiratory: Negative for cough and shortness of breath.   Gastrointestinal: Negative for vomiting, abdominal pain and diarrhea.  Skin: Negative for rash.   Past Medical History  Diagnosis Date  . Hypertension   . Hearing loss   . Depression   . Diabetes mellitus   . Rosacea   . Thyroid disease   . Hyperlipidemia   . Cataract   . Glaucoma    Past Surgical History  Procedure Laterality Date  . Eye surgery    . Tonsillectomy      over 50 yrs ago   Allergies  Allergen Reactions  . Citalopram   . Wellbutrin [Bupropion Hcl] Other (See Comments)    Sluggish    Current Outpatient Prescriptions on File Prior to Visit  Medication Sig Dispense Refill  . amLODipine (NORVASC) 10 MG tablet Take 1 tablet (10 mg total) by mouth daily. Needs office visit  90 tablet  3  . aspirin 81 MG tablet Take 160 mg by mouth daily.      . clindamycin (CLEOCIN) 300 MG capsule Take 1 capsule (300 mg total) by mouth 3 (three) times daily.  30 capsule  0  . doxycycline (VIBRA-TABS) 100 MG tablet Take 1 tablet (100 mg total) by mouth 2 (two) times daily.  20 tablet  0  . DULoxetine (CYMBALTA) 30  MG capsule TAKE 1 CAPSULE DAILY  90 capsule  3  . latanoprost (XALATAN) 0.005 % ophthalmic solution Place 1 drop into both eyes at bedtime.      Marland Kitchen levothyroxine (SYNTHROID, LEVOTHROID) 50 MCG tablet Take 1 tablet (50 mcg total) by mouth daily.  90 tablet  3  . lisinopril-hydrochlorothiazide (PRINZIDE,ZESTORETIC) 20-12.5 MG per tablet Take 1 tablet by mouth daily.  90 tablet  3  . metoprolol (LOPRESSOR) 100 MG tablet TAKE ONE TABLET BY MOUTH TWICE DAILY   60 tablet  2  . sitaGLIPtan-metformin (JANUMET) 50-1000 MG per tablet Take 1 tablet by mouth 2 (two) times daily with a meal.  180 tablet  3   No current facility-administered medications on file prior to visit.       Objective:   Physical Exam  Nursing note and vitals reviewed. Constitutional: He appears well-developed and well-nourished. No distress.  HENT:  Head: Normocephalic and atraumatic.  Right Ear: External ear normal.  Left Ear: External ear normal.  Nose: Nose normal.  Mouth/Throat: Oropharynx is clear and moist. No oropharyngeal exudate.  Eyes: Conjunctivae are normal. Pupils are equal, round, and reactive to light.  Neck: Neck supple.  Cardiovascular: Normal rate, regular rhythm and normal heart sounds.   Pulmonary/Chest: Effort normal  and breath sounds normal. He has no wheezes. He has no rales.  Lymphadenopathy:    He has no cervical adenopathy.  Skin: He is not diaphoretic.       Assessment & Plan:  Allergic rhinitis  1.  Allergic Rhinitis: New. Versus mild URI.  Rx for Fluticasone provided; recommend starting Claritin 10mg  daily; recommend Delsym for cough. If no improvement in 3-5 days, start Amoxicillin for sinusitis.  Meds ordered this encounter  Medications  . fluticasone (FLONASE) 50 MCG/ACT nasal spray    Sig: Place 2 sprays into the nose daily.    Dispense:  16 g    Refill:  6  . amoxicillin (AMOXIL) 500 MG capsule    Sig: Take 2 capsules (1,000 mg total) by mouth 2 (two) times daily.    Dispense:   40 capsule    Refill:  0

## 2013-06-18 NOTE — Patient Instructions (Addendum)

## 2013-07-20 ENCOUNTER — Telehealth: Payer: Self-pay | Admitting: *Deleted

## 2013-07-20 ENCOUNTER — Ambulatory Visit (INDEPENDENT_AMBULATORY_CARE_PROVIDER_SITE_OTHER): Payer: Medicare Other | Admitting: Family Medicine

## 2013-07-20 VITALS — BP 156/90 | HR 72 | Temp 98.2°F | Resp 17 | Ht 68.5 in | Wt 175.0 lb

## 2013-07-20 DIAGNOSIS — E039 Hypothyroidism, unspecified: Secondary | ICD-10-CM

## 2013-07-20 DIAGNOSIS — I10 Essential (primary) hypertension: Secondary | ICD-10-CM

## 2013-07-20 DIAGNOSIS — H16139 Photokeratitis, unspecified eye: Secondary | ICD-10-CM | POA: Diagnosis not present

## 2013-07-20 DIAGNOSIS — E119 Type 2 diabetes mellitus without complications: Secondary | ICD-10-CM | POA: Diagnosis not present

## 2013-07-20 DIAGNOSIS — L570 Actinic keratosis: Secondary | ICD-10-CM

## 2013-07-20 LAB — COMPREHENSIVE METABOLIC PANEL
ALT: 12 U/L (ref 0–53)
AST: 16 U/L (ref 0–37)
Albumin: 4.6 g/dL (ref 3.5–5.2)
Alkaline Phosphatase: 44 U/L (ref 39–117)
BUN: 21 mg/dL (ref 6–23)
CO2: 31 mEq/L (ref 19–32)
Calcium: 10 mg/dL (ref 8.4–10.5)
Chloride: 97 mEq/L (ref 96–112)
Creat: 1.3 mg/dL (ref 0.50–1.35)
Glucose, Bld: 143 mg/dL — ABNORMAL HIGH (ref 70–99)
Potassium: 3.4 mEq/L — ABNORMAL LOW (ref 3.5–5.3)
Sodium: 139 mEq/L (ref 135–145)
Total Bilirubin: 0.6 mg/dL (ref 0.3–1.2)
Total Protein: 7.1 g/dL (ref 6.0–8.3)

## 2013-07-20 LAB — POCT GLYCOSYLATED HEMOGLOBIN (HGB A1C): Hemoglobin A1C: 5.8

## 2013-07-20 LAB — TSH: TSH: 3.254 u[IU]/mL (ref 0.350–4.500)

## 2013-07-20 MED ORDER — DULOXETINE HCL 30 MG PO CPEP: ORAL_CAPSULE | ORAL | Status: DC

## 2013-07-20 MED ORDER — METOPROLOL SUCCINATE ER 200 MG PO TB24: 200.0000 mg | ORAL_TABLET | Freq: Every day | ORAL | Status: DC

## 2013-07-20 NOTE — Telephone Encounter (Signed)
Patient's blood pressure was higher so I increased the dose

## 2013-07-20 NOTE — Telephone Encounter (Signed)
Patient's blood pressure was elevated so I increased the dose of lopressor

## 2013-07-20 NOTE — Telephone Encounter (Signed)
Pt called and stated that he wanted you to know that he was already on the Lopressor  100mg  1 tab twice a day, which is what you increased him to today 200mg  of the XL.  Do you want to keep it that way or did you want to make another change?    [phone]

## 2013-07-20 NOTE — Progress Notes (Signed)
  Subjective:    Patient ID: Bryan Knight, male    DOB: 04/28/1946, 67 y.o.   MRN: [id number]  HPI 67 y.o. Male presents to clinic with refills of medications and a bump on his head. Has been doing better in regards to the cold he had. States that it went away and has since return. Pt has been having trouble with blood pressure as well. Pateint denies any trouble with feet. No problems with feet.   Review of Systems     Objective:   Physical Exam No distress, cheerful as always HEENT:  Normal Neck:  Supple, no adenop Chest: clear Heart: reg, no murmur BP recheck 160/90 Feet: no skin breaks, calluses Skin: no rash, small actinic keratosis on top of head-->sprayed with liquid N2     Assessment & Plan:  Hypertension - Plan: metoprolol (TOPROL-XL) 200 MG 24 hr tablet, Comprehensive metabolic panel  Hypothyroid - Plan: TSH  Type 2 diabetes mellitus - Plan: POCT glycosylated hemoglobin (Hb A1C)  Actinic keratosis  Signed, [name], MD

## 2013-07-21 NOTE — Telephone Encounter (Signed)
Spoke with pt and informed him that we switched him from the Lopressor 100mg  bid to the Toprol XL 200mg  1 qd. Pt was confused about this. I answered all his questions and clarified his misunderstandings.

## 2013-08-02 ENCOUNTER — Other Ambulatory Visit: Payer: Self-pay | Admitting: Family Medicine

## 2013-08-02 MED ORDER — DULOXETINE HCL 30 MG PO CPEP: ORAL_CAPSULE | ORAL | Status: DC

## 2013-08-28 ENCOUNTER — Encounter: Payer: Self-pay | Admitting: Family Medicine

## 2013-08-28 ENCOUNTER — Ambulatory Visit (INDEPENDENT_AMBULATORY_CARE_PROVIDER_SITE_OTHER): Payer: Medicare Other | Admitting: Family Medicine

## 2013-08-28 VITALS — BP 140/80 | HR 64 | Temp 97.7°F | Resp 16 | Ht 68.5 in | Wt 173.0 lb

## 2013-08-28 DIAGNOSIS — I10 Essential (primary) hypertension: Secondary | ICD-10-CM | POA: Diagnosis not present

## 2013-08-28 NOTE — Progress Notes (Signed)
Patient ID: Bryan Knight MRN: [id number], DOB: 08-Jul-1946, 67 y.o. Date of Encounter: 08/28/2013, 12:14 PM  Primary Physician: [name], MD  Chief Complaint: HTN  HPI: 67 y.o. year old male with history below presents for hypertension follow up. Blood pressure readings are 170-200/90-110  No CP, HA, visual changes, or focal deficits.   Past Medical History  Diagnosis Date  . Hypertension   . Hearing loss   . Depression   . Diabetes mellitus   . Rosacea   . Thyroid disease   . Hyperlipidemia   . Cataract   . Glaucoma      Home Meds: Prior to Admission medications   Medication Sig Start Date End Date Taking? Authorizing Provider  amLODipine (NORVASC) 10 MG tablet Take 1 tablet (10 mg total) by mouth daily. Needs office visit 01/15/13  Yes [name], MD  aspirin 81 MG tablet Take 160 mg by mouth daily.   Yes Historical Provider, MD  DULoxetine (CYMBALTA) 30 MG capsule TAKE 1 CAPSULE DAILY 08/02/13  Yes [name], MD  latanoprost (XALATAN) 0.005 % ophthalmic solution Place 1 drop into both eyes at bedtime.   Yes Historical Provider, MD  levothyroxine (SYNTHROID, LEVOTHROID) 50 MCG tablet Take 1 tablet (50 mcg total) by mouth daily. 01/16/13  Yes [name], MD  lisinopril-hydrochlorothiazide (PRINZIDE,ZESTORETIC) 20-12.5 MG per tablet Take 1 tablet by mouth daily. 01/15/13 01/15/14 Yes [name], MD  metoprolol (TOPROL-XL) 200 MG 24 hr tablet Take 1 tablet (200 mg total) by mouth daily. 07/20/13  Yes [name], MD  PRESCRIPTION MEDICATION Doxycycline 100 mg taking once daily   Yes Historical Provider, MD  PRESCRIPTION MEDICATION Potassium Gluconate 595 mg taking   Yes Historical Provider, MD  sitaGLIPtan-metformin (JANUMET) 50-1000 MG per tablet Take 1 tablet by mouth 2 (two) times daily with a meal. 01/15/13  Yes [name], MD  fluticasone (FLONASE) 50 MCG/ACT nasal spray Place 2 sprays into the nose daily. 06/18/13   Wardell Honour, MD     Allergies:  Allergies  Allergen Reactions  . Citalopram   . Wellbutrin [Bupropion Hcl] Other (See Comments)    Sluggish     History   Social History  . Marital Status: Married    Spouse Name: N/A    Number of Children: N/A  . Years of Education: N/A   Occupational History  . Not on file.   Social History Main Topics  . Smoking status: Former Smoker    Types: Cigarettes    Quit date: 10/18/1967  . Smokeless tobacco: Not on file  . Alcohol Use: No  . Drug Use: No  . Sexual Activity: Yes    Birth Control/ Protection: None     Comment: married   Other Topics Concern  . Not on file   Social History Narrative  . No narrative on file     Family History  Problem Relation Age of Onset  . Hypertension Mother   . Cancer Mother     Review of Systems: Constitutional: negative for chills, fever, night sweats, weight changes, or fatigue  HEENT: negative for vision changes, hearing loss, congestion, rhinorrhea, ST, epistaxis, or sinus pressure Cardiovascular: negative for chest pain, palpitations, or DOE Respiratory: negative for hemoptysis, wheezing, shortness of breath, or cough Abdominal: negative for abdominal pain, nausea, vomiting, diarrhea, or constipation Dermatological: negative for rash Neurologic: negative for headache, dizziness, or syncope All other systems reviewed and are otherwise negative with the exception to those above and in the HPI.  Physical Exam:  140/70 recheck seated left arm Blood pressure 140/80, pulse 64, temperature 97.7 F (36.5 C), temperature source Oral, resp. rate 16, height 5' 8.5" (1.74 m), weight 173 lb (78.472 kg), SpO2 98.00%., Body mass index is 25.92 kg/(m^2). General: Well developed, well nourished, in no acute distress. Head: Normocephalic, atraumatic, eyes without discharge, sclera non-icteric, nares are without discharge. Bilateral auditory canals clear, TM's are without perforation, pearly grey and translucent with  reflective cone of light bilaterally. Oral cavity moist, posterior pharynx without exudate, erythema, peritonsillar abscess, or post nasal drip.  Neck: Supple. No thyromegaly. Full ROM. No lymphadenopathy. No carotid bruits. Lungs: Clear bilaterally to auscultation without wheezes, rales, or rhonchi. Breathing is unlabored. Heart: RRR with S1 S2. No murmurs, rubs, or gallops appreciated.  Abdomen: Soft, non-tender, non-distended with normoactive bowel sounds. No hepatosplenomegaly. No rebound/guarding. No obvious abdominal masses. Msk:  Strength and tone normal for age. Extremities/Skin: Warm and dry. No clubbing or cyanosis. No edema. No rashes or suspicious lesions. Distal pulses 2+ and equal bilaterally. Neuro: Alert and oriented X 3. Moves all extremities spontaneously. Gait is normal. CNII-XII grossly in tact. DTR 2+, cerebellar function intact. Rhomberg normal. Psych:  Responds to questions appropriately with a normal affect.   Results for orders placed in visit on 07/20/13  TSH      Result Value Range   TSH 3.254  0.350 - 4.500 uIU/mL  COMPREHENSIVE METABOLIC PANEL      Result Value Range   Sodium 139  135 - 145 mEq/L   Potassium 3.4 (*) 3.5 - 5.3 mEq/L   Chloride 97  96 - 112 mEq/L   CO2 31  19 - 32 mEq/L   Glucose, Bld 143 (*) 70 - 99 mg/dL   BUN 21  6 - 23 mg/dL   Creat 1.30  0.50 - 1.35 mg/dL   Total Bilirubin 0.6  0.3 - 1.2 mg/dL   Alkaline Phosphatase 44  39 - 117 U/L   AST 16  0 - 37 U/L   ALT 12  0 - 53 U/L   Total Protein 7.1  6.0 - 8.3 g/dL   Albumin 4.6  3.5 - 5.2 g/dL   Calcium 10.0  8.4 - 10.5 mg/dL  POCT GLYCOSYLATED HEMOGLOBIN (HGB A1C)      Result Value Range   Hemoglobin A1C 5.8       ASSESSMENT AND PLAN:  67 y.o. year old male with what appears to be stable BP.  I think he has a faulty blood pressure machine. -Hypertension  Signed, [name], MD    Signed, [name], MD 08/28/2013 12:14 PM

## 2013-08-28 NOTE — Patient Instructions (Signed)

## 2013-09-07 ENCOUNTER — Ambulatory Visit (INDEPENDENT_AMBULATORY_CARE_PROVIDER_SITE_OTHER): Payer: Medicare Other | Admitting: Family Medicine

## 2013-09-07 VITALS — BP 138/84 | HR 96 | Temp 98.5°F | Resp 18 | Ht 68.5 in | Wt 172.0 lb

## 2013-09-07 DIAGNOSIS — J209 Acute bronchitis, unspecified: Secondary | ICD-10-CM | POA: Diagnosis not present

## 2013-09-07 DIAGNOSIS — I10 Essential (primary) hypertension: Secondary | ICD-10-CM

## 2013-09-07 MED ORDER — HYDROCOD POLST-CHLORPHEN POLST 10-8 MG/5ML PO LQCR: 5.0000 mL | Freq: Two times a day (BID) | ORAL | Status: DC | PRN

## 2013-09-07 MED ORDER — PREDNISONE 20 MG PO TABS: 40.0000 mg | ORAL_TABLET | Freq: Every day | ORAL | Status: DC

## 2013-09-07 NOTE — Progress Notes (Signed)
67 yo retired man with 5 days of dry cough and no fever.  He believes he caught it from his grandson. Has been controlling the cough with Robitussin and Delsym Sinus congestion and reflux symptoms are absent No fever.  Objective:  NAD  HEENT:  Unremarkable Chest:  Few faint wheezes. Heart:  Reg, no murmur Ext: no edema Neck:  No JVD  Assessment:  Bronchitis Acute bronchitis - Plan: chlorpheniramine-HYDROcodone (TUSSIONEX PENNKINETIC ER) 10-8 MG/5ML LQCR, predniSONE (DELTASONE) 20 MG tablet  Signed, [name], MD

## 2013-10-18 ENCOUNTER — Other Ambulatory Visit: Payer: Self-pay | Admitting: Family Medicine

## 2013-10-24 DIAGNOSIS — I10 Essential (primary) hypertension: Secondary | ICD-10-CM | POA: Diagnosis not present

## 2013-10-24 DIAGNOSIS — E119 Type 2 diabetes mellitus without complications: Secondary | ICD-10-CM | POA: Diagnosis not present

## 2013-10-24 DIAGNOSIS — T18108A Unspecified foreign body in esophagus causing other injury, initial encounter: Secondary | ICD-10-CM | POA: Insufficient documentation

## 2013-10-24 DIAGNOSIS — K259 Gastric ulcer, unspecified as acute or chronic, without hemorrhage or perforation: Secondary | ICD-10-CM | POA: Diagnosis not present

## 2013-10-24 DIAGNOSIS — Z87891 Personal history of nicotine dependence: Secondary | ICD-10-CM | POA: Diagnosis not present

## 2013-10-24 DIAGNOSIS — Z79899 Other long term (current) drug therapy: Secondary | ICD-10-CM | POA: Diagnosis not present

## 2013-10-24 DIAGNOSIS — H409 Unspecified glaucoma: Secondary | ICD-10-CM | POA: Diagnosis not present

## 2013-10-24 DIAGNOSIS — K2940 Chronic atrophic gastritis without bleeding: Secondary | ICD-10-CM | POA: Diagnosis not present

## 2013-10-24 DIAGNOSIS — E039 Hypothyroidism, unspecified: Secondary | ICD-10-CM | POA: Diagnosis not present

## 2013-10-24 DIAGNOSIS — R1110 Vomiting, unspecified: Secondary | ICD-10-CM | POA: Diagnosis not present

## 2013-10-24 DIAGNOSIS — Z7982 Long term (current) use of aspirin: Secondary | ICD-10-CM | POA: Diagnosis not present

## 2013-10-24 DIAGNOSIS — K222 Esophageal obstruction: Secondary | ICD-10-CM | POA: Diagnosis not present

## 2013-10-28 DIAGNOSIS — K219 Gastro-esophageal reflux disease without esophagitis: Secondary | ICD-10-CM | POA: Diagnosis not present

## 2013-10-29 DIAGNOSIS — H4011X0 Primary open-angle glaucoma, stage unspecified: Secondary | ICD-10-CM | POA: Diagnosis not present

## 2013-10-29 DIAGNOSIS — H43399 Other vitreous opacities, unspecified eye: Secondary | ICD-10-CM | POA: Diagnosis not present

## 2013-10-29 DIAGNOSIS — H26499 Other secondary cataract, unspecified eye: Secondary | ICD-10-CM | POA: Diagnosis not present

## 2013-10-29 DIAGNOSIS — H2510 Age-related nuclear cataract, unspecified eye: Secondary | ICD-10-CM | POA: Diagnosis not present

## 2013-12-04 ENCOUNTER — Encounter: Payer: Self-pay | Admitting: Family Medicine

## 2013-12-04 ENCOUNTER — Ambulatory Visit (INDEPENDENT_AMBULATORY_CARE_PROVIDER_SITE_OTHER): Payer: Medicare Other | Admitting: Family Medicine

## 2013-12-04 VITALS — BP 163/81 | HR 58 | Temp 97.6°F | Resp 16 | Ht 68.5 in | Wt 171.0 lb

## 2013-12-04 DIAGNOSIS — E119 Type 2 diabetes mellitus without complications: Secondary | ICD-10-CM

## 2013-12-04 DIAGNOSIS — I10 Essential (primary) hypertension: Secondary | ICD-10-CM

## 2013-12-04 LAB — COMPREHENSIVE METABOLIC PANEL
ALT: 16 U/L (ref 0–53)
AST: 18 U/L (ref 0–37)
Albumin: 4.7 g/dL (ref 3.5–5.2)
Alkaline Phosphatase: 48 U/L (ref 39–117)
BUN: 22 mg/dL (ref 6–23)
CO2: 30 mEq/L (ref 19–32)
Calcium: 9.5 mg/dL (ref 8.4–10.5)
Chloride: 96 mEq/L (ref 96–112)
Creat: 1.47 mg/dL — ABNORMAL HIGH (ref 0.50–1.35)
Glucose, Bld: 132 mg/dL — ABNORMAL HIGH (ref 70–99)
Potassium: 3.4 mEq/L — ABNORMAL LOW (ref 3.5–5.3)
Sodium: 140 mEq/L (ref 135–145)
Total Bilirubin: 0.6 mg/dL (ref 0.2–1.2)
Total Protein: 7.2 g/dL (ref 6.0–8.3)

## 2013-12-04 LAB — POCT URINALYSIS DIPSTICK
Bilirubin, UA: NEGATIVE
Glucose, UA: NEGATIVE
Ketones, UA: NEGATIVE
Leukocytes, UA: NEGATIVE
Nitrite, UA: NEGATIVE
Protein, UA: 100
Spec Grav, UA: 1.025
Urobilinogen, UA: 0.2
pH, UA: 7

## 2013-12-04 LAB — POCT GLYCOSYLATED HEMOGLOBIN (HGB A1C): Hemoglobin A1C: 6.3

## 2013-12-04 NOTE — Progress Notes (Signed)
Subjective:  This chart was scribed for Bryan Haber, MD by Donato Schultz, Medical Scribe. This patient was seen in Room 27 and the patient's care was started at 10:49 AM.   Patient ID: Bryan Knight, male    DOB: 1945/09/22, 68 y.o.   MRN: [id number]  HPI HPI Comments: Bryan Knight is a 68 y.o. male who presents to the [facility] for a follow-up appointment.  The patient states that his blood sugar levels and blood pressure levels has been mildly elevated.  The patient denies experiencing any hypoglycemic spells.  He states that he is feeling well otherwise.  The patient's blood pressure in the exam room was 136/82.  The patient's last A1C was in November of last year.  The patient states that he was admitted into an ED in [place] in February for an endoscopy after he had a piece of food lodged in his throat.     Past Medical History  Diagnosis Date   Hypertension    Hearing loss    Depression    Diabetes mellitus    Rosacea    Thyroid disease    Hyperlipidemia    Cataract    Glaucoma    Past Surgical History  Procedure Laterality Date   Eye surgery     Tonsillectomy      over 46 yrs ago   Family History  Problem Relation Age of Onset   Hypertension Mother    Cancer Mother    History   Social History   Marital Status: Married    Spouse Name: N/A    Number of Children: N/A   Years of Education: N/A   Occupational History   Not on file.   Social History Main Topics   Smoking status: Former Smoker    Types: Cigarettes    Quit date: 10/18/1967   Smokeless tobacco: Not on file   Alcohol Use: No   Drug Use: No   Sexual Activity: Yes    Birth Control/ Protection: None     Comment: married   Other Topics Concern   Not on file   Social History Narrative   No narrative on file   Allergies  Allergen Reactions   Citalopram    Wellbutrin [Bupropion Hcl] Other (See Comments)    Sluggish     Review of  Systems  All other systems reviewed and are negative.     Objective:  Physical Exam  Nursing note and vitals reviewed. Constitutional: He is oriented to person, place, and time. He appears well-developed and well-nourished. No distress.  HENT:  Head: Normocephalic and atraumatic.  Eyes: EOM are normal.  Neck: Neck supple. No tracheal deviation present.  Cardiovascular: Normal rate, regular rhythm and normal heart sounds.  Exam reveals no gallop and no friction rub.   No murmur heard. Pulmonary/Chest: Effort normal. No respiratory distress.  Musculoskeletal: Normal range of motion.  Neurological: He is alert and oriented to person, place, and time.  Skin: Skin is warm and dry.  Psychiatric: He has a normal mood and affect. His behavior is normal.    BP recheck in each arm:  135/88  BP 163/81   Pulse 58   Temp(Src) 97.6 F (36.4 C)   Resp 16   Ht 5' 8.5" (1.74 m)   Wt 171 lb (77.565 kg)   BMI 25.62 kg/m2   SpO2 95% Assessment & Plan:    I personally performed the services described in this documentation, which was  scribed in my presence. The recorded information has been reviewed and is accurate.   Hypertension appears to be reasonable. Type 2 diabetes mellitus - Plan: HM Diabetes Foot Exam, POCT urinalysis dipstick, Comprehensive metabolic panel, POCT glycosylated hemoglobin (Hb A1C)  Hypertension - Plan: POCT urinalysis dipstick, Comprehensive metabolic panel, POCT glycosylated hemoglobin (Hb A1C)   Signed, Bryan Haber, MD

## 2014-01-20 ENCOUNTER — Other Ambulatory Visit: Payer: Self-pay | Admitting: Family Medicine

## 2014-01-21 ENCOUNTER — Other Ambulatory Visit: Payer: Self-pay | Admitting: Family Medicine

## 2014-02-18 DIAGNOSIS — H4011X0 Primary open-angle glaucoma, stage unspecified: Secondary | ICD-10-CM | POA: Diagnosis not present

## 2014-02-18 DIAGNOSIS — H2510 Age-related nuclear cataract, unspecified eye: Secondary | ICD-10-CM | POA: Diagnosis not present

## 2014-03-08 ENCOUNTER — Other Ambulatory Visit: Payer: Self-pay | Admitting: Family Medicine

## 2014-03-09 ENCOUNTER — Ambulatory Visit: Payer: Medicare Other | Admitting: Family Medicine

## 2014-03-09 DIAGNOSIS — H269 Unspecified cataract: Secondary | ICD-10-CM | POA: Diagnosis not present

## 2014-03-09 DIAGNOSIS — H409 Unspecified glaucoma: Secondary | ICD-10-CM | POA: Diagnosis not present

## 2014-03-09 DIAGNOSIS — H2589 Other age-related cataract: Secondary | ICD-10-CM | POA: Diagnosis not present

## 2014-03-09 DIAGNOSIS — H2510 Age-related nuclear cataract, unspecified eye: Secondary | ICD-10-CM | POA: Diagnosis not present

## 2014-03-29 ENCOUNTER — Other Ambulatory Visit: Payer: Self-pay | Admitting: Family Medicine

## 2014-04-06 ENCOUNTER — Ambulatory Visit (INDEPENDENT_AMBULATORY_CARE_PROVIDER_SITE_OTHER): Payer: Medicare Other | Admitting: Family Medicine

## 2014-04-06 ENCOUNTER — Encounter: Payer: Self-pay | Admitting: Family Medicine

## 2014-04-06 VITALS — BP 142/87 | HR 70 | Temp 98.2°F | Resp 18 | Ht 68.0 in | Wt 174.8 lb

## 2014-04-06 DIAGNOSIS — I10 Essential (primary) hypertension: Secondary | ICD-10-CM

## 2014-04-06 DIAGNOSIS — E038 Other specified hypothyroidism: Secondary | ICD-10-CM

## 2014-04-06 DIAGNOSIS — E039 Hypothyroidism, unspecified: Secondary | ICD-10-CM

## 2014-04-06 DIAGNOSIS — F32A Depression, unspecified: Secondary | ICD-10-CM

## 2014-04-06 DIAGNOSIS — F329 Major depressive disorder, single episode, unspecified: Secondary | ICD-10-CM

## 2014-04-06 DIAGNOSIS — E119 Type 2 diabetes mellitus without complications: Secondary | ICD-10-CM

## 2014-04-06 DIAGNOSIS — F3289 Other specified depressive episodes: Secondary | ICD-10-CM

## 2014-04-06 LAB — POCT GLYCOSYLATED HEMOGLOBIN (HGB A1C): Hemoglobin A1C: 6.4

## 2014-04-06 MED ORDER — LISINOPRIL-HYDROCHLOROTHIAZIDE 20-12.5 MG PO TABS: 1.0000 | ORAL_TABLET | Freq: Every day | ORAL | Status: DC

## 2014-04-06 MED ORDER — LEVOTHYROXINE SODIUM 50 MCG PO TABS: 50.0000 ug | ORAL_TABLET | Freq: Every day | ORAL | Status: DC

## 2014-04-06 MED ORDER — METOPROLOL SUCCINATE ER 200 MG PO TB24: 200.0000 mg | ORAL_TABLET | Freq: Every day | ORAL | Status: DC

## 2014-04-06 MED ORDER — AMLODIPINE BESYLATE 10 MG PO TABS: 10.0000 mg | ORAL_TABLET | Freq: Every day | ORAL | Status: DC

## 2014-04-06 MED ORDER — DULOXETINE HCL 30 MG PO CPEP: ORAL_CAPSULE | ORAL | Status: DC

## 2014-04-06 MED ORDER — SITAGLIPTIN PHOS-METFORMIN HCL 50-1000 MG PO TABS: 1.0000 | ORAL_TABLET | Freq: Two times a day (BID) | ORAL | Status: DC

## 2014-04-06 NOTE — Patient Instructions (Signed)
Next visit December 2015

## 2014-04-06 NOTE — Progress Notes (Signed)
Patient ID: Bryan Knight MRN: [id number], DOB: January 14, 1946, 68 y.o. Date of Encounter: 04/06/2014, 10:22 AM  Primary Physician: [name], MD  Chief Complaint: Hypertension  This chart was scribed for [name], MD by Cathie Hoops, ED Scribe. The patient's care was started at 10:07 AM.    HPI: 68 y.o. year old male with history below presents with hypertension.  He was initially a patient of Marilynne Drivers, MD now retired and has been on multiple medication combinations over the year, many of which made him feel awful.  He also saw Dr. Salem Senate, MD nephrologist whose attempts at tighter blood pressure control also uncovered multiple drug intolerances.  Pt states he is currently taking 3 prescriptions to control his blood pressure but his BP remains slightly elevated. Pt states he has been mowing grass and doing chores. Pt denies doing regular exercise. Pt denies having any foot pain.  Pt states his blood sugar is currently monitored and well-maintained.   BS's < 150  Pt state she had a successful cataract surgery. Pt states he will see Dr. Venetia Maxon in 2 weeks.   Pt states his mother had severe hypertension and she passed away from liver cancer. Pt states he has helped take care of his grandchildren, ages 45 and 65, and pets.   Past Medical History  Diagnosis Date  . Hypertension   . Hearing loss   . Depression   . Diabetes mellitus   . Rosacea   . Thyroid disease   . Hyperlipidemia   . Cataract   . Glaucoma      Home Meds: Prior to Admission medications   Medication Sig Start Date End Date Taking? Authorizing Provider  amLODipine (NORVASC) 10 MG tablet TAKE ONE TABLET BY MOUTH ONE TIME DAILY    Yes Collene Leyden, PA-C  aspirin 81 MG tablet Take 160 mg by mouth daily.   Yes Historical Provider, MD  DULoxetine (CYMBALTA) 30 MG capsule TAKE 1 CAPSULE DAILY 08/02/13  Yes [name], MD  glucose blood (ONETOUCH VERIO) test strip Test blood sugar daily. Dx code:  250.00 10/18/13  Yes Eleanore Kurtis Bushman, PA-C  JANUMET 50-1000 MG per tablet TAKE 1 TABLET TWICE A DAY  WITH A MEAL 03/08/14  Yes Mancel Bale, PA-C  latanoprost (XALATAN) 0.005 % ophthalmic solution Place 1 drop into both eyes at bedtime.   Yes Historical Provider, MD  levothyroxine (SYNTHROID, LEVOTHROID) 50 MCG tablet Take 1 tablet (50 mcg total) by mouth daily. 01/16/13  Yes [name], MD  lisinopril-hydrochlorothiazide (PRINZIDE,ZESTORETIC) 20-12.5 MG per tablet TAKE ONE TABLET BY MOUTH ONE TIME DAILY  01/20/14  Yes Chelle S Jeffery, PA-C  metoprolol (TOPROL-XL) 200 MG 24 hr tablet Take 1 tablet (200 mg total) by mouth daily. 07/20/13  Yes [name], MD  [facility] DELICA LANCETS FINE MISC Test once daily 01/21/14  Yes [name], MD  pantoprazole (PROTONIX) 40 MG tablet Take 40 mg by mouth daily.   Yes Historical Provider, MD    Allergies:  Allergies  Allergen Reactions  . Citalopram   . Wellbutrin [Bupropion Hcl] Other (See Comments)    Sluggish     History   Social History  . Marital Status: Married    Spouse Name: N/A    Number of Children: N/A  . Years of Education: N/A   Occupational History  . Not on file.   Social History Main Topics  . Smoking status: Former Smoker    Types: Cigarettes    Quit  date: 10/18/1967  . Smokeless tobacco: Not on file  . Alcohol Use: No  . Drug Use: No  . Sexual Activity: Yes    Birth Control/ Protection: None     Comment: married   Other Topics Concern  . Not on file   Social History Narrative  . No narrative on file     Review of Systems: Constitutional: negative for chills, fever, night sweats, weight changes, or fatigue  HEENT: negative for vision changes, hearing loss, congestion, rhinorrhea, ST, epistaxis, or sinus pressure Cardiovascular: negative for chest pain or palpitations Respiratory: negative for hemoptysis, wheezing, shortness of breath, or cough Abdominal: negative for abdominal pain, nausea, vomiting,  diarrhea, or constipation Dermatological: negative for rash Neurologic: negative for headache, dizziness, or syncope All other systems reviewed and are otherwise negative with the exception to those above and in the HPI.   Physical Exam:  BP recheck initially 18/86, then, after deep breaths x 3, 166/82 Triage Vitals: Blood pressure 142/87, pulse 70, temperature 98.2 F (36.8 C), temperature source Oral, resp. rate 18, height 5\' 8"  (1.727 m), weight 174 lb 12.8 oz (79.289 kg), SpO2 97.00%., Body mass index is 26.58 kg/(m^2). General: Well developed, well nourished, in no acute distress. Head: Normocephalic, atraumatic, eyes without discharge, sclera non-icteric, nares are without discharge. Bilateral auditory canals clear, TM's are without perforation, pearly grey and translucent with reflective cone of light bilaterally. Oral cavity moist, posterior pharynx without exudate, erythema, peritonsillar abscess, or post nasal drip.  Neck: Supple. No thyromegaly. Full ROM. No lymphadenopathy. Lungs: Clear bilaterally to auscultation without wheezes, rales, or rhonchi. Breathing is unlabored. Heart: RRR with S1 S2. No murmurs, rubs, or gallops appreciated. Abdomen: Soft, non-tender, non-distended with normoactive bowel sounds. No hepatomegaly. No rebound/guarding. No obvious abdominal masses. Msk:  Strength and tone normal for age. Extremities/Skin: Warm and dry. No clubbing or cyanosis. No edema. No rashes or suspicious lesions.   Some scaling of peeling feet Neuro: Alert and oriented X 3. Moves all extremities spontaneously. Gait is normal. CNII-XII grossly in tact. Psych:  Responds to questions appropriately with a normal affect.    ASSESSMENT AND PLAN:  DIAGNOSTIC STUDIES: Oxygen Saturation is 97% on RA, adequate by my interpretation.   Diabetes mellitus type II, controlled - Plan: HM Diabetes Foot Exam Hypertension:  Labile Hypothyroidism:  Controlled with synthroid   COORDINATION OF  CARE:   Signed, [name], MD 04/06/2014 10:22 AM

## 2014-04-07 ENCOUNTER — Other Ambulatory Visit: Payer: Self-pay | Admitting: Family Medicine

## 2014-04-07 DIAGNOSIS — E876 Hypokalemia: Secondary | ICD-10-CM

## 2014-04-07 DIAGNOSIS — E039 Hypothyroidism, unspecified: Secondary | ICD-10-CM

## 2014-04-07 LAB — COMPREHENSIVE METABOLIC PANEL WITH GFR
ALT: 15 U/L (ref 0–53)
AST: 17 U/L (ref 0–37)
Albumin: 4.4 g/dL (ref 3.5–5.2)
Alkaline Phosphatase: 50 U/L (ref 39–117)
BUN: 16 mg/dL (ref 6–23)
CO2: 29 meq/L (ref 19–32)
Calcium: 9.5 mg/dL (ref 8.4–10.5)
Chloride: 94 meq/L — ABNORMAL LOW (ref 96–112)
Creat: 1.38 mg/dL — ABNORMAL HIGH (ref 0.50–1.35)
Glucose, Bld: 192 mg/dL — ABNORMAL HIGH (ref 70–99)
Potassium: 3.2 meq/L — ABNORMAL LOW (ref 3.5–5.3)
Sodium: 140 meq/L (ref 135–145)
Total Bilirubin: 0.6 mg/dL (ref 0.2–1.2)
Total Protein: 7.2 g/dL (ref 6.0–8.3)

## 2014-04-07 LAB — TSH: TSH: 4.82 u[IU]/mL — ABNORMAL HIGH (ref 0.350–4.500)

## 2014-04-07 MED ORDER — POTASSIUM CHLORIDE CRYS ER 20 MEQ PO TBCR: 20.0000 meq | EXTENDED_RELEASE_TABLET | Freq: Every day | ORAL | Status: DC

## 2014-04-07 MED ORDER — LEVOTHYROXINE SODIUM 75 MCG PO TABS: 75.0000 ug | ORAL_TABLET | Freq: Every day | ORAL | Status: DC

## 2014-06-04 ENCOUNTER — Encounter: Payer: Self-pay | Admitting: Family Medicine

## 2014-06-04 ENCOUNTER — Ambulatory Visit (INDEPENDENT_AMBULATORY_CARE_PROVIDER_SITE_OTHER): Payer: Medicare Other | Admitting: Family Medicine

## 2014-06-04 VITALS — BP 162/86 | HR 58 | Temp 97.6°F | Resp 16 | Ht 68.0 in | Wt 174.0 lb

## 2014-06-04 DIAGNOSIS — L719 Rosacea, unspecified: Secondary | ICD-10-CM

## 2014-06-04 DIAGNOSIS — I10 Essential (primary) hypertension: Secondary | ICD-10-CM

## 2014-06-04 DIAGNOSIS — E119 Type 2 diabetes mellitus without complications: Secondary | ICD-10-CM | POA: Diagnosis not present

## 2014-06-04 DIAGNOSIS — E039 Hypothyroidism, unspecified: Secondary | ICD-10-CM

## 2014-06-04 NOTE — Progress Notes (Signed)
This chart was scribed for [name], MD by Ladene Artist, ED Scribe. The patient was seen in room 26. Patient's care was started at 9:29 AM.  Patient ID: Bryan Knight, male   DOB: 1945-10-13, 68 y.o.   MRN: [id number]   Patient ID: Bryan Knight MRN: [id number], DOB: 30-Jul-1946, 68 y.o. Date of Encounter: 06/04/2014, 9:29 AM  Primary Physician: [name], MD  Chief Complaint  Patient presents with  . Follow-up    DM, BP    HPI: 68 y.o. year old male with history below presents for DM and BP follow-up. Pt states that he checks his BP regularly with levels which are consistent with today's BP 162/86. BP during examinations: 130/86. Pt's blood sugar levels never fall below 100s. Last A1C was 6.4 2 months ago. Pt does not need a refill on any medications at this time.   Opthalmologist: Dr. Venetia Maxon  Pt reports eating a lot of candy and a big mac from [employer] yesterday. Pt states that he found his father's death certificate the other day that showed that he passed at age 81 from a fall. Pt has 2 grandchildren.   Past Medical History  Diagnosis Date  . Hypertension   . Hearing loss   . Depression   . Diabetes mellitus   . Rosacea   . Thyroid disease   . Hyperlipidemia   . Cataract   . Glaucoma      Home Meds: Prior to Admission medications   Medication Sig Start Date End Date Taking? Authorizing Provider  amLODipine (NORVASC) 10 MG tablet Take 1 tablet (10 mg total) by mouth daily. 04/06/14   [name], MD  aspirin 81 MG tablet Take 160 mg by mouth daily.    Historical Provider, MD  DULoxetine (CYMBALTA) 30 MG capsule TAKE 1 CAPSULE DAILY 04/06/14   [name], MD  glucose blood (ONETOUCH VERIO) test strip Test blood sugar daily. Dx code: 250.00 10/18/13   Theda Sers, PA-C  latanoprost (XALATAN) 0.005 % ophthalmic solution Place 1 drop into both eyes at bedtime.    Historical Provider, MD  levothyroxine (SYNTHROID, LEVOTHROID) 75 MCG tablet Take 1  tablet (75 mcg total) by mouth daily. 04/07/14   [name], MD  lisinopril-hydrochlorothiazide (PRINZIDE,ZESTORETIC) 20-12.5 MG per tablet Take 1 tablet by mouth daily. 04/06/14   [name], MD  metoprolol (TOPROL-XL) 200 MG 24 hr tablet Take 1 tablet (200 mg total) by mouth daily. 04/06/14   [name], MD  [facility] DELICA LANCETS FINE MISC Test once daily 01/21/14   [name], MD  potassium chloride SA (K-DUR,KLOR-CON) 20 MEQ tablet Take 1 tablet (20 mEq total) by mouth daily. 04/07/14   [name], MD  sitaGLIPtin-metformin (JANUMET) 50-1000 MG per tablet Take 1 tablet by mouth 2 (two) times daily with a meal. 04/06/14   [name], MD    Allergies:  Allergies  Allergen Reactions  . Citalopram   . Wellbutrin [Bupropion Hcl] Other (See Comments)    Sluggish     History   Social History  . Marital Status: Married    Spouse Name: N/A    Number of Children: N/A  . Years of Education: N/A   Occupational History  . Not on file.   Social History Main Topics  . Smoking status: Former Smoker    Types: Cigarettes    Quit date: 10/18/1967  . Smokeless tobacco: Not on file  . Alcohol Use: No  . Drug Use: No  . Sexual Activity: Yes  Birth Control/ Protection: None     Comment: married   Other Topics Concern  . Not on file   Social History Narrative  . No narrative on file     Review of Systems: Constitutional: negative for chills, fever, night sweats, weight changes, or fatigue  HEENT: negative for vision changes, hearing loss, congestion, rhinorrhea, ST, epistaxis, or sinus pressure Cardiovascular: negative for chest pain or palpitations Respiratory: negative for hemoptysis, wheezing, shortness of breath, or cough Abdominal: negative for abdominal pain, nausea, vomiting, diarrhea, or constipation Dermatological: negative for rash Neurologic: negative for headache, dizziness, or syncope All other systems reviewed and are otherwise negative with  the exception to those above and in the HPI.   Physical Exam: Triage Vitals: Blood pressure 162/86, pulse 58, temperature 97.6 F (36.4 C), resp. rate 16, height 5\' 8"  (1.727 m), weight 174 lb (78.926 kg), SpO2 98.00%., Body mass index is 26.46 kg/(m^2). Blood pressure during examination: 130/86 General: Well developed, well nourished, in no acute distress. Head: Normocephalic, atraumatic, eyes without discharge, sclera non-icteric, nares are without discharge. Bilateral auditory canals clear, TM's are without perforation, pearly grey and translucent with reflective cone of light bilaterally. Oral cavity moist, posterior pharynx without exudate, erythema, peritonsillar abscess, or post nasal drip.  Neck: Supple. No thyromegaly. Full ROM. No lymphadenopathy. Lungs: Clear bilaterally to auscultation without wheezes, rales, or rhonchi. Breathing is unlabored. Heart: RRR with S1 S2. No murmurs, rubs, or gallops appreciated. Abdomen: Soft, non-tender, non-distended with normoactive bowel sounds. No hepatomegaly. No rebound/guarding. No obvious abdominal masses. Msk:  Strength and tone normal for age. Extremities/Skin: Warm and dry. No clubbing or cyanosis. No edema. No rashes or suspicious lesions. Neuro: Alert and oriented X 3. Moves all extremities spontaneously. Gait is normal. CNII-XII grossly in tact. Psych:  Responds to questions appropriately with a normal affect.     ASSESSMENT AND PLAN:  68 y.o. year old male with  Essential hypertension  Rosacea  Type 2 diabetes mellitus without complication  Unspecified hypothyroidism  I personally performed the services described in this documentation, which was scribed in my presence. The recorded information has been reviewed and is accurate.  Signed, [name], MD 06/04/2014 9:28 AM

## 2014-06-11 ENCOUNTER — Ambulatory Visit (INDEPENDENT_AMBULATORY_CARE_PROVIDER_SITE_OTHER): Payer: Medicare Other | Admitting: Podiatry

## 2014-06-11 ENCOUNTER — Encounter: Payer: Self-pay | Admitting: Podiatry

## 2014-06-11 DIAGNOSIS — B351 Tinea unguium: Secondary | ICD-10-CM

## 2014-06-11 DIAGNOSIS — M79609 Pain in unspecified limb: Secondary | ICD-10-CM

## 2014-06-11 DIAGNOSIS — M79673 Pain in unspecified foot: Secondary | ICD-10-CM

## 2014-06-11 NOTE — Patient Instructions (Signed)
Diabetes and Foot Care Diabetes may cause you to have problems because of poor blood supply (circulation) to your feet and legs. This may cause the skin on your feet to become thinner, break easier, and heal more slowly. Your skin may become dry, and the skin may peel and crack. You may also have nerve damage in your legs and feet causing decreased feeling in them. You may not notice minor injuries to your feet that could lead to infections or more serious problems. Taking care of your feet is one of the most important things you can do for yourself.  HOME CARE INSTRUCTIONS  Wear shoes at all times, even in the house. Do not go barefoot. Bare feet are easily injured.  Check your feet daily for blisters, cuts, and redness. If you cannot see the bottom of your feet, use a mirror or ask someone for help.  Wash your feet with warm water (do not use hot water) and mild soap. Then pat your feet and the areas between your toes until they are completely dry. Do not soak your feet as this can dry your skin.  Apply a moisturizing lotion or petroleum jelly (that does not contain alcohol and is unscented) to the skin on your feet and to dry, brittle toenails. Do not apply lotion between your toes.  Trim your toenails straight across. Do not dig under them or around the cuticle. File the edges of your nails with an emery board or nail file.  Do not cut corns or calluses or try to remove them with medicine.  Wear clean socks or stockings every day. Make sure they are not too tight. Do not wear knee-high stockings since they may decrease blood flow to your legs.  Wear shoes that fit properly and have enough cushioning. To break in new shoes, wear them for just a few hours a day. This prevents you from injuring your feet. Always look in your shoes before you put them on to be sure there are no objects inside.  Do not cross your legs. This may decrease the blood flow to your feet.  If you find a minor scrape,  cut, or break in the skin on your feet, keep it and the skin around it clean and dry. These areas may be cleansed with mild soap and water. Do not cleanse the area with peroxide, alcohol, or iodine.  When you remove an adhesive bandage, be sure not to damage the skin around it.  If you have a wound, look at it several times a day to make sure it is healing.  Do not use heating pads or hot water bottles. They may burn your skin. If you have lost feeling in your feet or legs, you may not know it is happening until it is too late.  Make sure your health care provider performs a complete foot exam at least annually or more often if you have foot problems. Report any cuts, sores, or bruises to your health care provider immediately. SEEK MEDICAL CARE IF:   You have an injury that is not healing.  You have cuts or breaks in the skin.  You have an ingrown nail.  You notice redness on your legs or feet.  You feel burning or tingling in your legs or feet.  You have pain or cramps in your legs and feet.  Your legs or feet are numb.  Your feet always feel cold. SEEK IMMEDIATE MEDICAL CARE IF:   There is increasing redness,   swelling, or pain in or around a wound.  There is a red line that goes up your leg.  Pus is coming from a wound.  You develop a fever or as directed by your health care provider.  You notice a bad smell coming from an ulcer or wound. Document Released: 09/01/2000 Document Revised: 05/07/2013 Document Reviewed: 02/11/2013 Exit[employer] Patient Information 2015 [employer]. This information is not intended to replace advice given to you by your health care provider. Make sure you discuss any questions you have with your health care provider.  

## 2014-06-12 NOTE — Progress Notes (Signed)
Subjective:     Patient ID: Bryan Knight, male   DOB: Jan 24, 1946, 68 y.o.   MRN: [id number]  HPI patient presents with nail disease and thickness with pain 1-5 both feet   Review of Systems     Objective:   Physical Exam Neurovascular status intact with thick yellow brittle nailbeds 1-5 both feet with  pain noted    Assessment:     Mycotic nail infection with pain 1-5 both feet    Plan:     Debride painful nailbeds 1-5 both feet with no iatrogenic bleeding noted

## 2014-08-06 ENCOUNTER — Telehealth: Payer: Self-pay | Admitting: Family Medicine

## 2014-08-06 ENCOUNTER — Ambulatory Visit (INDEPENDENT_AMBULATORY_CARE_PROVIDER_SITE_OTHER): Payer: Medicare Other | Admitting: Family Medicine

## 2014-08-06 DIAGNOSIS — J209 Acute bronchitis, unspecified: Secondary | ICD-10-CM | POA: Diagnosis not present

## 2014-08-06 MED ORDER — BENZONATATE 100 MG PO CAPS: 100.0000 mg | ORAL_CAPSULE | Freq: Three times a day (TID) | ORAL | Status: DC | PRN

## 2014-08-06 MED ORDER — HYDROCOD POLST-CHLORPHEN POLST 10-8 MG/5ML PO LQCR: 5.0000 mL | Freq: Two times a day (BID) | ORAL | Status: DC | PRN

## 2014-08-06 NOTE — Progress Notes (Signed)
Subjective:    Patient ID: Bryan Knight, male    DOB: 1946-01-27, 68 y.o.   MRN: [id number] This chart was scribed for Delman Cheadle, MD by Zola Button, Medical Scribe. This patient was seen in Room 2 and the patient's care was started at 8:24 AM.  No chief complaint on file.   HPI HPI Comments: Bryan Knight is a 68 y.o. male who presents to the [facility] complaining of gradual onset, intermittent dry cough episodes that began 3 days ago. He states he normally experiences similar symptoms 1-2 times a year. Patient notes having some associated voice changes. He has not tried any treatments yet. He denies fever, chills, sore throat, nasal congestion, sinus pressure, SOB, and wheezing. He quit smoking in 1969 when he left the [employer]. He has used an inhaler in the past.  Past Medical History  Diagnosis Date  . Hypertension   . Hearing loss   . Depression   . Diabetes mellitus   . Rosacea   . Thyroid disease   . Hyperlipidemia   . Cataract   . Glaucoma    Current Outpatient Prescriptions on File Prior to Visit  Medication Sig Dispense Refill  . amLODipine (NORVASC) 10 MG tablet Take 1 tablet (10 mg total) by mouth daily. 90 tablet 3  . aspirin 81 MG tablet Take 160 mg by mouth daily.    . benzonatate (TESSALON) 100 MG capsule Take 1-2 capsules (100-200 mg total) by mouth 3 (three) times daily as needed for cough. 60 capsule 0  . chlorpheniramine-HYDROcodone (TUSSIONEX PENNKINETIC ER) 10-8 MG/5ML LQCR Take 5 mLs by mouth every 12 (twelve) hours as needed. 120 mL 0  . DULoxetine (CYMBALTA) 30 MG capsule TAKE 1 CAPSULE DAILY 90 capsule 3  . glucose blood (ONETOUCH VERIO) test strip Test blood sugar daily. Dx code: 250.00 100 each 2  . latanoprost (XALATAN) 0.005 % ophthalmic solution Place 1 drop into both eyes at bedtime.    Marland Kitchen levothyroxine (SYNTHROID, LEVOTHROID) 75 MCG tablet Take 1 tablet (75 mcg total) by mouth daily. 90 tablet 3  . lisinopril-hydrochlorothiazide  (PRINZIDE,ZESTORETIC) 20-12.5 MG per tablet Take 1 tablet by mouth daily. 90 tablet 2  . metoprolol (TOPROL-XL) 200 MG 24 hr tablet Take 1 tablet (200 mg total) by mouth daily. 90 tablet 3  . ONETOUCH DELICA LANCETS FINE MISC Test once daily 100 each 2  . potassium chloride SA (K-DUR,KLOR-CON) 20 MEQ tablet Take 1 tablet (20 mEq total) by mouth daily. 30 tablet 3  . sitaGLIPtin-metformin (JANUMET) 50-1000 MG per tablet Take 1 tablet by mouth 2 (two) times daily with a meal. 180 tablet 3   No current facility-administered medications on file prior to visit.   Allergies  Allergen Reactions  . Citalopram   . Tussionex Pennkinetic Er [Hydrocod Polst-Cpm Polst Er] Other (See Comments)    Foggy headed  . Wellbutrin [Bupropion Hcl] Other (See Comments)    Sluggish      Review of Systems  Constitutional: Negative for fever and chills.  HENT: Positive for voice change. Negative for congestion, sinus pressure and sore throat.   Respiratory: Positive for cough. Negative for shortness of breath and wheezing.        Objective:  There were no vitals taken for this visit.  Physical Exam  Constitutional: He is oriented to person, place, and time. He appears well-developed and well-nourished. No distress.  HENT:  Head: Normocephalic and atraumatic.  Right Ear: Tympanic membrane normal.  Left Ear: Tympanic membrane normal.  Mouth/Throat: Oropharynx is clear and moist. No oropharyngeal exudate.  Nasal mucosal erythema.  Eyes: Pupils are equal, round, and reactive to light.  Neck: Neck supple.  Cardiovascular: Normal rate, regular rhythm, S1 normal, S2 normal and normal heart sounds.   No murmur heard. Pulmonary/Chest: Effort normal and breath sounds normal. No respiratory distress. He has no wheezes. He has no rales.  Decreased air movement in the bases.  Musculoskeletal: He exhibits no edema.  Neurological: He is alert and oriented to person, place, and time. No cranial nerve deficit.  Skin:  Skin is warm and dry. No rash noted.  Psychiatric: He has a normal mood and affect. His behavior is normal.  Nursing note and vitals reviewed.         Assessment & Plan:  See telephone encounter for same date for diagnosis and orders which were unable to be put in this visit at the time of encounter as power and internet were down when pt checked in to be seen so OV encounter was not created until later in the day when pt was out of the office.  Acute bronchitis - suspect viral - pt reassured - try symptomatic care.  If does not improve in next 2-3d, will call in zpack.  Try tussionex qhs w/ delsym or tessalon during day   I personally performed the services described in this documentation, which was scribed in my presence. The recorded information has been reviewed and considered, and addended by me as needed.  Delman Cheadle, MD MPH

## 2014-08-06 NOTE — Telephone Encounter (Signed)
Bronchitis - suspect viral.  If sxs worsen at all in next few days, ok to call in zpack.  Try delsym or tessalon pearles, and mucinex during day with tussionex qhs.

## 2014-08-08 ENCOUNTER — Ambulatory Visit (INDEPENDENT_AMBULATORY_CARE_PROVIDER_SITE_OTHER): Payer: Medicare Other | Admitting: Family Medicine

## 2014-08-08 VITALS — BP 182/70 | HR 112 | Temp 97.9°F | Resp 18 | Ht 69.0 in | Wt 171.0 lb

## 2014-08-08 DIAGNOSIS — J209 Acute bronchitis, unspecified: Secondary | ICD-10-CM | POA: Diagnosis not present

## 2014-08-08 MED ORDER — AZITHROMYCIN 250 MG PO TABS: ORAL_TABLET | ORAL | Status: DC

## 2014-08-08 MED ORDER — METHYLPREDNISOLONE 4 MG PO TABS: 8.0000 mg | ORAL_TABLET | Freq: Every day | ORAL | Status: DC

## 2014-08-08 NOTE — Patient Instructions (Signed)

## 2014-08-08 NOTE — Progress Notes (Signed)
Patient ID: Bryan Knight MRN: [id number], DOB: 13-Jul-1946, 68 y.o. Date of Encounter: 08/08/2014, 8:43 AM  Primary Physician: [name], MD  Chief Complaint:  Chief Complaint  Patient presents with  . Follow-up    Still coughing - don't feel any better    HPI: 68 y.o. year old male presents with a 5 day history of nasal congestion, post nasal drip, sore throat, and cough. Mild sinus pressure. Afebrile. No chills. Nasal congestion thick and green/yellow. Cough is productive of green/yellow sputum and not associated with time of day. Ears feel full, leading to sensation of muffled hearing. Has tried OTC cold preps without success. No GI complaints.   No sick contacts, recent antibiotics, or recent travels.   No leg trauma, sedentary periods, h/o cancer, or tobacco use.  Of note is the fact that patient took Tussionex yesterday and has no memory of what he did yesterday except that he lost control of his bladder.  Past Medical History  Diagnosis Date  . Hypertension   . Hearing loss   . Depression   . Diabetes mellitus   . Rosacea   . Thyroid disease   . Hyperlipidemia   . Cataract   . Glaucoma      Home Meds: Prior to Admission medications   Medication Sig Start Date End Date Taking? Authorizing Provider  amLODipine (NORVASC) 10 MG tablet Take 1 tablet (10 mg total) by mouth daily. 04/06/14  Yes [name], MD  aspirin 81 MG tablet Take 160 mg by mouth daily.   Yes Historical Provider, MD  benzonatate (TESSALON) 100 MG capsule Take 1-2 capsules (100-200 mg total) by mouth 3 (three) times daily as needed for cough. 08/06/14  Yes Shawnee Knapp, MD  chlorpheniramine-HYDROcodone [facility] PENNKINETIC ER) 10-8 MG/5ML LQCR Take 5 mLs by mouth every 12 (twelve) hours as needed. 08/06/14  Yes Shawnee Knapp, MD  DULoxetine (CYMBALTA) 30 MG capsule TAKE 1 CAPSULE DAILY 04/06/14  Yes [name], MD  glucose blood (ONETOUCH VERIO) test strip Test blood sugar daily. Dx code:  250.00 10/18/13  Yes Eleanore Kurtis Bushman, PA-C  latanoprost (XALATAN) 0.005 % ophthalmic solution Place 1 drop into both eyes at bedtime.   Yes Historical Provider, MD  levothyroxine (SYNTHROID, LEVOTHROID) 75 MCG tablet Take 1 tablet (75 mcg total) by mouth daily. 04/07/14  Yes [name], MD  lisinopril-hydrochlorothiazide (PRINZIDE,ZESTORETIC) 20-12.5 MG per tablet Take 1 tablet by mouth daily. 04/06/14  Yes [name], MD  metoprolol (TOPROL-XL) 200 MG 24 hr tablet Take 1 tablet (200 mg total) by mouth daily. 04/06/14  Yes [name], MD  [facility] DELICA LANCETS FINE MISC Test once daily 01/21/14  Yes [name], MD  potassium chloride SA (K-DUR,KLOR-CON) 20 MEQ tablet Take 1 tablet (20 mEq total) by mouth daily. 04/07/14  Yes [name], MD  sitaGLIPtin-metformin (JANUMET) 50-1000 MG per tablet Take 1 tablet by mouth 2 (two) times daily with a meal. 04/06/14  Yes [name], MD  azithromycin (ZITHROMAX Z-PAK) 250 MG tablet Take as directed on pack 08/08/14   [name], MD  methylPREDNISolone (MEDROL) 4 MG tablet Take 2 tablets (8 mg total) by mouth daily. 08/08/14   [name], MD    Allergies:  Allergies  Allergen Reactions  . Citalopram   . Tussionex Pennkinetic Er [Hydrocod Polst-Cpm Polst Er] Other (See Comments)    Foggy headed  . Wellbutrin [Bupropion Hcl] Other (See Comments)    Sluggish     History   Social  History  . Marital Status: Married    Spouse Name: N/A    Number of Children: N/A  . Years of Education: N/A   Occupational History  . Not on file.   Social History Main Topics  . Smoking status: Former Smoker    Types: Cigarettes    Quit date: 10/18/1967  . Smokeless tobacco: Not on file  . Alcohol Use: No  . Drug Use: No  . Sexual Activity: Yes    Birth Control/ Protection: None     Comment: married   Other Topics Concern  . Not on file   Social History Narrative     Review of Systems: Constitutional: negative for  chills, fever, night sweats or weight changes Cardiovascular: negative for chest pain or palpitations Respiratory: negative for hemoptysis, wheezing, or shortness of breath Abdominal: negative for abdominal pain, nausea, vomiting or diarrhea Dermatological: negative for rash Neurologic: negative for headache   Physical Exam: Blood pressure 182/70, pulse 112, temperature 97.9 F (36.6 C), temperature source Oral, resp. rate 18, height 5\' 9"  (1.753 m), weight 171 lb (77.565 kg), SpO2 97 %., Body mass index is 25.24 kg/(m^2). General: Well developed, well nourished, in no acute distress. Head: Normocephalic, atraumatic, eyes without discharge, sclera non-icteric, nares are congested. Bilateral auditory canals clear, TM's are without perforation, pearly grey with reflective cone of light bilaterally. No sinus TTP. Oral cavity moist, dentition normal. Posterior pharynx with post nasal drip and mild erythema. No peritonsillar abscess or tonsillar exudate. Neck: Supple. No thyromegaly. Full ROM. No lymphadenopathy. Lungs: Coarse breath sounds bilaterally without wheezes, rales, or rhonchi. Breathing is unlabored.  Heart: RRR with S1 S2. No murmurs, rubs, or gallops appreciated. Msk:  Strength and tone normal for age. Extremities: No clubbing or cyanosis. No edema. Neuro: Alert and oriented X 3. Moves all extremities spontaneously. CNII-XII grossly in tact. Psych:  Responds to questions appropriately with a normal affect.     ASSESSMENT AND PLAN:  68 y.o. year old male with bronchitis. No diagnosis found.  -Tylenol/Motrin prn -Rest/fluids -RTC precautions -RTC 3-5 days if no improvement  Signed, [name], MD 08/08/2014 8:43 AM

## 2014-08-18 DIAGNOSIS — H4011X2 Primary open-angle glaucoma, moderate stage: Secondary | ICD-10-CM | POA: Diagnosis not present

## 2014-08-25 ENCOUNTER — Telehealth: Payer: Self-pay | Admitting: Family Medicine

## 2014-08-25 NOTE — Telephone Encounter (Signed)
Patient stated someone called him from [facility] requesting information on his Eye Physician and Surgeon. Dr. Marylynn Pearson, [facility] [place] [facility] [phone] [address]

## 2014-08-25 NOTE — Telephone Encounter (Signed)
Noted, put in chart. Unfortunately there is no record of this phone call

## 2014-08-27 ENCOUNTER — Ambulatory Visit (INDEPENDENT_AMBULATORY_CARE_PROVIDER_SITE_OTHER): Payer: Medicare Other | Admitting: Family Medicine

## 2014-08-27 ENCOUNTER — Encounter: Payer: Self-pay | Admitting: Family Medicine

## 2014-08-27 VITALS — BP 182/79 | HR 61 | Temp 97.8°F | Resp 16 | Ht 69.5 in | Wt 168.0 lb

## 2014-08-27 DIAGNOSIS — R05 Cough: Secondary | ICD-10-CM

## 2014-08-27 DIAGNOSIS — E119 Type 2 diabetes mellitus without complications: Secondary | ICD-10-CM

## 2014-08-27 DIAGNOSIS — R059 Cough, unspecified: Secondary | ICD-10-CM

## 2014-08-27 LAB — COMPREHENSIVE METABOLIC PANEL
ALT: 13 U/L (ref 0–53)
AST: 14 U/L (ref 0–37)
Albumin: 4.6 g/dL (ref 3.5–5.2)
Alkaline Phosphatase: 54 U/L (ref 39–117)
BUN: 20 mg/dL (ref 6–23)
CO2: 30 mEq/L (ref 19–32)
Calcium: 10.1 mg/dL (ref 8.4–10.5)
Chloride: 98 mEq/L (ref 96–112)
Creat: 1.67 mg/dL — ABNORMAL HIGH (ref 0.50–1.35)
Glucose, Bld: 137 mg/dL — ABNORMAL HIGH (ref 70–99)
Potassium: 3.8 mEq/L (ref 3.5–5.3)
Sodium: 140 mEq/L (ref 135–145)
Total Bilirubin: 0.5 mg/dL (ref 0.2–1.2)
Total Protein: 7.1 g/dL (ref 6.0–8.3)

## 2014-08-27 LAB — POCT GLYCOSYLATED HEMOGLOBIN (HGB A1C): Hemoglobin A1C: 6.7

## 2014-08-27 MED ORDER — MONTELUKAST SODIUM 10 MG PO TABS: 10.0000 mg | ORAL_TABLET | Freq: Every day | ORAL | Status: DC

## 2014-08-27 NOTE — Progress Notes (Signed)
Subjective:    Patient ID: Bryan Knight, male    DOB: [birth year], 68 y.o.   MRN: [id number]  This chart was scribed for [name], MD by Chester Holstein, ED Scribe. This patient was seen in room Room/bed info not found and the patient's care was started at 1:15 PM.   HPI HPI Comments: Bryan Knight is a 68 y.o. male with PMHx of diabetes who presents to the [facility] for a review of his medication. Pt has a list of his sugar readings and the average has been at 150.  Pt has not been taking the potassium pills regularly and he notes that they were difficult pills to swallow.  He is unsure about why he should be taking them. He notes he took it yesterday and states he felt more energized when he woke up today. He also wants to know if he should be taking the omeprazole.  Pt has PMHx of bronchitis and notes he still coughs in the afternoon and at night.  He states he often has a coughing spell for an hour or so. He notes he takes OTC medication for relief. He notes he does not want to be administered any Tussionex Pennkinetic ER as he did not like how it made him feel.  Pt notes he had his annual eye exam a couple weeks ago. Pt sees Dr. Venetia Maxon.   Past Medical History  Diagnosis Date   Hypertension    Hearing loss    Depression    Diabetes mellitus    Rosacea    Thyroid disease    Hyperlipidemia    Cataract    Glaucoma     Past Surgical History  Procedure Laterality Date   Eye surgery     Tonsillectomy      over 43 yrs ago    Family History  Problem Relation Age of Onset   Hypertension Mother    Cancer Mother     History   Social History   Marital Status: Married    Spouse Name: Arbie Cookey    Number of Children: N/A   Years of Education: N/A   Occupational History   Not on file.   Social History Main Topics   Smoking status: Former Smoker    Types: Cigarettes    Quit date: 10/18/1967   Smokeless tobacco: Not on file    Alcohol Use: No   Drug Use: No   Sexual Activity: Yes    Birth Control/ Protection: None     Comment: married   Other Topics Concern   Not on file   Social History Narrative    Allergies  Allergen Reactions   Citalopram    Tussionex Pennkinetic Er [Hydrocod Polst-Cpm Polst Er] Other (See Comments)    Foggy headed   Wellbutrin [Bupropion Hcl] Other (See Comments)    Sluggish       Review of Systems  Eyes: Negative for visual disturbance.  Respiratory: Positive for cough (in afternoon and at night).        Objective:   Physical Exam  Constitutional: He is oriented to person, place, and time. He appears well-developed and well-nourished.  HENT:  Head: Normocephalic.  Eyes: Conjunctivae are normal.  Neck: Normal range of motion. Neck supple.  Pulmonary/Chest: Effort normal.  Musculoskeletal: Normal range of motion.  Neurological: He is alert and oriented to person, place, and time.  Skin: Skin is warm and dry.  Psychiatric: He has a normal mood and affect.  His behavior is normal.  Nursing note and vitals reviewed. fundi benign      BP 182/79 mmHg   Pulse 61   Temp(Src) 97.8 F (36.6 C) (Oral)   Resp 16   Ht 5' 9.5" (1.765 m)   Wt 168 lb (76.204 kg)   BMI 24.46 kg/m2   SpO2 98%  Assessment & Plan:   This chart was scribed in my presence and reviewed by me personally.    ICD-9-CM ICD-10-CM   1. Cough 786.2 R05 montelukast (SINGULAIR) 10 MG tablet  2. Type 2 diabetes mellitus without complication [id number] [id number] POCT glycosylated hemoglobin (Hb A1C)     Microalbumin, urine     Comprehensive metabolic panel   Systolic hypertension  Signed, [name], MD

## 2014-08-28 LAB — MICROALBUMIN, URINE: Microalb, Ur: 114.6 mg/dL — ABNORMAL HIGH (ref <2.0)

## 2014-08-31 ENCOUNTER — Ambulatory Visit: Payer: Medicare Other | Admitting: Family Medicine

## 2014-09-05 ENCOUNTER — Ambulatory Visit (INDEPENDENT_AMBULATORY_CARE_PROVIDER_SITE_OTHER): Payer: Medicare Other | Admitting: Family Medicine

## 2014-09-05 VITALS — BP 168/102 | HR 124 | Temp 98.8°F | Resp 20 | Ht 69.0 in | Wt 167.6 lb

## 2014-09-05 DIAGNOSIS — J209 Acute bronchitis, unspecified: Secondary | ICD-10-CM

## 2014-09-05 MED ORDER — AZITHROMYCIN 250 MG PO TABS: ORAL_TABLET | ORAL | Status: DC

## 2014-09-05 NOTE — Progress Notes (Signed)
Patient ID: Bryan Knight MRN: [id number], DOB: [birth year], 68 y.o. Date of Encounter: 09/05/2014, 9:10 AM  Primary Physician: [name], MD  Chief Complaint:  Chief Complaint  Patient presents with  . Cough    x3weeks. Dry cough.     HPI: 68 y.o. year old male presents with 8 day history of nasal congestion, post nasal drip, sore throat, sinus pressure, and cough. Afebrile. No chills. Nasal congestion thick and green/yellow. Sinus pressure is the worst symptom. Cough is productive secondary to post nasal drip and not associated with time of day. Ears feel full, leading to sensation of muffled hearing. Has tried OTC cold preps without success. No GI complaints.   No recent antibiotics, recent travels, vomiting, or sick contacts   No leg trauma, sedentary periods, h/o cancer, or tobacco use.  Past Medical History  Diagnosis Date  . Hypertension   . Hearing loss   . Depression   . Diabetes mellitus   . Rosacea   . Thyroid disease   . Hyperlipidemia   . Cataract   . Glaucoma      Home Meds: Prior to Admission medications   Medication Sig Start Date End Date Taking? Authorizing Provider  amLODipine (NORVASC) 10 MG tablet Take 1 tablet (10 mg total) by mouth daily. 04/06/14  Yes [name], MD  aspirin 81 MG tablet Take 160 mg by mouth daily.   Yes Historical Provider, MD  DULoxetine (CYMBALTA) 30 MG capsule TAKE 1 CAPSULE DAILY 04/06/14  Yes [name], MD  glucose blood (ONETOUCH VERIO) test strip Test blood sugar daily. Dx code: 250.00 10/18/13  Yes Eleanore Kurtis Bushman, PA-C  latanoprost (XALATAN) 0.005 % ophthalmic solution Place 1 drop into both eyes at bedtime.   Yes Historical Provider, MD  levothyroxine (SYNTHROID, LEVOTHROID) 75 MCG tablet Take 1 tablet (75 mcg total) by mouth daily. 04/07/14  Yes [name], MD  lisinopril-hydrochlorothiazide (PRINZIDE,ZESTORETIC) 20-12.5 MG per tablet Take 1 tablet by mouth daily. 04/06/14  Yes [name], MD    metoprolol (TOPROL-XL) 200 MG 24 hr tablet Take 1 tablet (200 mg total) by mouth daily. 04/06/14  Yes [name], MD  montelukast (SINGULAIR) 10 MG tablet Take 1 tablet (10 mg total) by mouth at bedtime. 08/27/14  Yes [name], MD  [facility] DELICA LANCETS FINE MISC Test once daily 01/21/14  Yes [name], MD  potassium chloride SA (K-DUR,KLOR-CON) 20 MEQ tablet Take 1 tablet (20 mEq total) by mouth daily. 04/07/14  Yes [name], MD  sitaGLIPtin-metformin (JANUMET) 50-1000 MG per tablet Take 1 tablet by mouth 2 (two) times daily with a meal. 04/06/14  Yes [name], MD    Allergies:  Allergies  Allergen Reactions  . Citalopram   . Tussionex Pennkinetic Er [Hydrocod Polst-Cpm Polst Er] Other (See Comments)    Foggy headed  . Wellbutrin [Bupropion Hcl] Other (See Comments)    Sluggish     History   Social History  . Marital Status: Married    Spouse Name: Arbie Cookey    Number of Children: N/A  . Years of Education: N/A   Occupational History  . Not on file.   Social History Main Topics  . Smoking status: Former Smoker    Types: Cigarettes    Quit date: 10/18/1967  . Smokeless tobacco: Not on file  . Alcohol Use: No  . Drug Use: No  . Sexual Activity: Yes    Birth Control/ Protection: None     Comment: married   Other  Topics Concern  . Not on file   Social History Narrative     Review of Systems: Constitutional: negative for chills, fever, night sweats or weight changes Cardiovascular: negative for chest pain or palpitations Respiratory: negative for hemoptysis, wheezing, or shortness of breath Abdominal: negative for abdominal pain, nausea, vomiting or diarrhea Dermatological: negative for rash Neurologic: negative for headache   Physical Exam: Blood pressure 168/102, pulse 124, temperature 98.8 F (37.1 C), temperature source Oral, resp. rate 20, height 5\' 9"  (1.753 m), weight 167 lb 9.6 oz (76.023 kg), SpO2 98 %., Body mass index is 24.74  kg/(m^2). General: Well developed, well nourished, in no acute distress. Head: Normocephalic, atraumatic, eyes without discharge, sclera non-icteric, nares are congested. Bilateral auditory canals clear, TM's are without perforation, pearly grey with reflective cone of light bilaterally. Serous effusion bilaterally behind TM's. Maxillary sinus TTP. Oral cavity moist, dentition normal. Posterior pharynx with post nasal drip and mild erythema. No peritonsillar abscess or tonsillar exudate. Neck: Supple. No thyromegaly. Full ROM. No lymphadenopathy. Lungs: Clear bilaterally to auscultation with wheezes, Heart: RRR with S1 S2. No murmurs, rubs, or gallops appreciated. Msk:  Strength and tone normal for age. Extremities: No clubbing or cyanosis. No edema. Neuro: Alert and oriented X 3. Moves all extremities spontaneously. CNII-XII grossly in tact. Psych:  Responds to questions appropriately with a normal affect.   Labs:   ASSESSMENT AND PLAN:  68 y.o. year old male with sinusitis -   ICD-9-CM ICD-10-CM   1. Acute bronchitis, unspecified organism 466.0 J20.9 azithromycin (ZITHROMAX Z-PAK) 250 MG tablet    Patient has tessalon and singulair.  He'll call me in 48 hours if cough not resolving -Rest/fluids -RTC precautions   Signed, [name], MD 09/05/2014 9:10 AM

## 2014-09-07 ENCOUNTER — Ambulatory Visit (INDEPENDENT_AMBULATORY_CARE_PROVIDER_SITE_OTHER): Payer: Medicare Other | Admitting: Podiatry

## 2014-09-07 DIAGNOSIS — B351 Tinea unguium: Secondary | ICD-10-CM

## 2014-09-07 DIAGNOSIS — M79673 Pain in unspecified foot: Secondary | ICD-10-CM | POA: Diagnosis not present

## 2014-09-07 NOTE — Progress Notes (Signed)
Subjective:     Patient ID: Bryan Knight, male   DOB: [birth year], 68 y.o.   MRN: [id number]  HPI patient presents with nail disease and thickness with pain 1-5 both feet   Review of Systems     Objective:   Physical Exam Neurovascular status intact with thick yellow brittle nailbeds 1-5 both feet with  pain noted    Assessment:     Mycotic nail infection with pain 1-5 both feet    Plan:     Debride painful nailbeds 1-5 both feet with no iatrogenic bleeding noted

## 2014-09-10 ENCOUNTER — Other Ambulatory Visit: Payer: Self-pay

## 2014-09-10 DIAGNOSIS — F32A Depression, unspecified: Secondary | ICD-10-CM

## 2014-09-10 DIAGNOSIS — F329 Major depressive disorder, single episode, unspecified: Secondary | ICD-10-CM

## 2014-09-10 MED ORDER — DULOXETINE HCL 30 MG PO CPEP: ORAL_CAPSULE | ORAL | Status: DC

## 2014-10-29 ENCOUNTER — Other Ambulatory Visit: Payer: Self-pay

## 2014-10-29 MED ORDER — GLUCOSE BLOOD VI STRP: ORAL_STRIP | Status: DC

## 2014-11-26 ENCOUNTER — Ambulatory Visit (INDEPENDENT_AMBULATORY_CARE_PROVIDER_SITE_OTHER): Payer: Medicare Other | Admitting: Family Medicine

## 2014-11-26 ENCOUNTER — Encounter: Payer: Self-pay | Admitting: Family Medicine

## 2014-11-26 VITALS — BP 181/83 | HR 64 | Temp 97.5°F | Resp 16 | Ht 69.5 in | Wt 170.0 lb

## 2014-11-26 DIAGNOSIS — L309 Dermatitis, unspecified: Secondary | ICD-10-CM | POA: Diagnosis not present

## 2014-11-26 DIAGNOSIS — J302 Other seasonal allergic rhinitis: Secondary | ICD-10-CM | POA: Diagnosis not present

## 2014-11-26 DIAGNOSIS — E119 Type 2 diabetes mellitus without complications: Secondary | ICD-10-CM | POA: Diagnosis not present

## 2014-11-26 DIAGNOSIS — I10 Essential (primary) hypertension: Secondary | ICD-10-CM | POA: Diagnosis not present

## 2014-11-26 DIAGNOSIS — Z23 Encounter for immunization: Secondary | ICD-10-CM | POA: Diagnosis not present

## 2014-11-26 LAB — POCT GLYCOSYLATED HEMOGLOBIN (HGB A1C): Hemoglobin A1C: 6.2

## 2014-11-26 MED ORDER — TRIAMCINOLONE ACETONIDE 0.1 % EX CREA: 1.0000 "application " | TOPICAL_CREAM | Freq: Two times a day (BID) | CUTANEOUS | Status: DC

## 2014-11-26 MED ORDER — CLONIDINE HCL 0.2 MG/24HR TD PTWK: 0.2000 mg | MEDICATED_PATCH | TRANSDERMAL | Status: DC

## 2014-11-26 MED ORDER — MONTELUKAST SODIUM 10 MG PO TABS: 10.0000 mg | ORAL_TABLET | Freq: Every day | ORAL | Status: DC

## 2014-11-26 NOTE — Progress Notes (Signed)
Subjective:  This chart was scribed for Bryan Haber, MD by Randa Evens, ED Scribe. This Patient was seen in room 27 and the patients care was started at 2:06 PM   Patient ID: Bryan Knight, male    DOB: July 22, 1946, 68 y.o.   MRN: [id number]  Chief Complaint  Patient presents with   Medication Management    HPI HPI Comments: Bryan Knight is a 69 y.o. male who presents to the [facility] for medication management  Pt states that his blood sugar average has been around 150 for the past 90 days. Pt states that he has a hx of Dm for the past 10-12 years. Pt states that he has an appointment with an eye doctor in June. Pt states that for his HTN he use to be on a patch that provided relief except for the dry mouth feeling. Pt is also currious about preventative medications for allergies. Pt states that he has some slight swelling in the feet. Pt states that he also has some dry skin around his ankles.  Pt doesn't report any other symptoms. Pt also would like the pneumonia vaccination today.   BP during office visit 170/84  Lab Results  Component Value Date   HGBA1C 6.7 08/27/2014     Past Medical History  Diagnosis Date   Hypertension    Hearing loss    Depression    Diabetes mellitus    Rosacea    Thyroid disease    Hyperlipidemia    Cataract    Glaucoma    Prior to Admission medications   Medication Sig Start Date End Date Taking? Authorizing Provider  amLODipine (NORVASC) 10 MG tablet Take 1 tablet (10 mg total) by mouth daily. 04/06/14  Yes Bryan Haber, MD  aspirin 81 MG tablet Take 160 mg by mouth daily.   Yes Historical Provider, MD  DULoxetine (CYMBALTA) 30 MG capsule TAKE 1 CAPSULE DAILY 09/10/14  Yes Bryan Haber, MD  glucose blood (ONETOUCH VERIO) test strip Test blood sugar daily. Dx code: E11.9 10/29/14  Yes Bryan Haber, MD  latanoprost (XALATAN) 0.005 % ophthalmic solution Place 1 drop into both eyes at bedtime.   Yes  Historical Provider, MD  levothyroxine (SYNTHROID, LEVOTHROID) 75 MCG tablet Take 1 tablet (75 mcg total) by mouth daily. 04/07/14  Yes Bryan Haber, MD  lisinopril-hydrochlorothiazide (PRINZIDE,ZESTORETIC) 20-12.5 MG per tablet Take 1 tablet by mouth daily. 04/06/14  Yes Bryan Haber, MD  metoprolol (TOPROL-XL) 200 MG 24 hr tablet Take 1 tablet (200 mg total) by mouth daily. 04/06/14  Yes Bryan Haber, MD  [facility] DELICA LANCETS FINE MISC Test once daily 01/21/14  Yes Bryan Haber, MD  sitaGLIPtin-metformin (JANUMET) 50-1000 MG per tablet Take 1 tablet by mouth 2 (two) times daily with a meal. 04/06/14  Yes Bryan Haber, MD     Review of Systems  Constitutional: Negative for fatigue and unexpected weight change.  Eyes: Negative for visual disturbance.  Respiratory: Negative for cough, chest tightness and shortness of breath.   Cardiovascular: Positive for leg swelling (feet). Negative for chest pain and palpitations.  Gastrointestinal: Negative for abdominal pain and blood in stool.  Skin: Positive for rash (dry skin).  Neurological: Negative for dizziness, light-headedness and headaches.     Objective:   BP 181/83 mmHg   Pulse 64   Temp(Src) 97.5 F (36.4 C)   Resp 16   Ht 5' 9.5" (1.765 m)   Wt 170 lb (77.111 kg)   BMI 24.75  kg/m2   SpO2 100%   Physical Exam  Constitutional: He is oriented to person, place, and time. He appears well-developed and well-nourished. No distress.  HENT:  Head: Normocephalic and atraumatic.  Eyes: Conjunctivae and EOM are normal.  Neck: Neck supple. No tracheal deviation present.  Cardiovascular: Normal rate.   Pulmonary/Chest: Effort normal. No respiratory distress.  Musculoskeletal: Normal range of motion.  Neurological: He is alert and oriented to person, place, and time.  Skin: Skin is warm and dry.  Psychiatric: He has a normal mood and affect. His behavior is normal.  Nursing note and vitals reviewed.  Results for orders placed or  performed in visit on 11/26/14  POCT glycosylated hemoglobin (Hb A1C)  Result Value Ref Range   Hemoglobin A1C 6.2      Assessment & Plan:   This chart was scribed in my presence and reviewed by me personally.    ICD-9-CM ICD-10-CM   1. Essential hypertension 401.9 I10 cloNIDine (CATAPRES - DOSED IN MG/24 HR) 0.2 mg/24hr patch  2. Diabetes type 2, controlled 250.00 E11.9 POCT glycosylated hemoglobin (Hb A1C)  3. Other seasonal allergic rhinitis 477.8 J30.2 montelukast (SINGULAIR) 10 MG tablet  4. Eczema 692.9 L30.9 triamcinolone cream (KENALOG) 0.1 %  5. Immunization, pneumococcus and influenza V06.6 Z23 Pneumococcal conjugate vaccine 13-valent IM     Signed, Bryan Haber, MD

## 2014-11-26 NOTE — Patient Instructions (Signed)

## 2014-12-07 ENCOUNTER — Ambulatory Visit (INDEPENDENT_AMBULATORY_CARE_PROVIDER_SITE_OTHER): Payer: Medicare Other | Admitting: Podiatry

## 2014-12-07 DIAGNOSIS — M79673 Pain in unspecified foot: Secondary | ICD-10-CM | POA: Diagnosis not present

## 2014-12-07 DIAGNOSIS — B351 Tinea unguium: Secondary | ICD-10-CM

## 2014-12-07 NOTE — Progress Notes (Signed)
Subjective:     Patient ID: Bryan Knight, male   DOB: 1945/11/02, 69 y.o.   MRN: [id number]  HPI patient presents with nail disease and thickness with pain 1-5 both feet   Review of Systems     Objective:   Physical Exam Neurovascular status intact with thick yellow brittle nailbeds 1-5 both feet with  pain noted    Assessment:     Mycotic nail infection with pain 1-5 both feet    Plan:     Debride painful nailbeds 1-5 both feet with no iatrogenic bleeding noted

## 2015-01-25 ENCOUNTER — Other Ambulatory Visit: Payer: Self-pay | Admitting: Family Medicine

## 2015-01-27 ENCOUNTER — Other Ambulatory Visit: Payer: Self-pay

## 2015-01-27 DIAGNOSIS — I10 Essential (primary) hypertension: Secondary | ICD-10-CM

## 2015-01-27 MED ORDER — AMLODIPINE BESYLATE 10 MG PO TABS: 10.0000 mg | ORAL_TABLET | Freq: Every day | ORAL | Status: DC

## 2015-02-11 ENCOUNTER — Ambulatory Visit (INDEPENDENT_AMBULATORY_CARE_PROVIDER_SITE_OTHER): Payer: Medicare Other | Admitting: Family Medicine

## 2015-02-11 ENCOUNTER — Encounter: Payer: Self-pay | Admitting: Family Medicine

## 2015-02-11 VITALS — BP 158/83 | HR 61 | Temp 97.7°F | Resp 16 | Ht 68.0 in | Wt 162.4 lb

## 2015-02-11 DIAGNOSIS — I10 Essential (primary) hypertension: Secondary | ICD-10-CM

## 2015-02-11 DIAGNOSIS — E119 Type 2 diabetes mellitus without complications: Secondary | ICD-10-CM | POA: Diagnosis not present

## 2015-02-11 LAB — POCT GLYCOSYLATED HEMOGLOBIN (HGB A1C): Hemoglobin A1C: 6.2

## 2015-02-11 MED ORDER — LISINOPRIL-HYDROCHLOROTHIAZIDE 20-12.5 MG PO TABS: 1.0000 | ORAL_TABLET | Freq: Every day | ORAL | Status: DC

## 2015-02-11 NOTE — Patient Instructions (Signed)
Recheck in 6 months. Clearly the clonidine patches are not making a significant difference in the do carry a mild potential for side effects so I think logistic with what you're taking now. I like to see you back if you have any new problems.

## 2015-02-11 NOTE — Progress Notes (Signed)
° °  Subjective:    Patient ID: Bryan Knight, male    DOB: 09/12/1946, 69 y.o.   MRN: [id number] This chart was scribed for [name], MD by Zola Button, Medical Scribe. This patient was seen in Room 24 and the patient's care was started at 11:20 AM.   HPI HPI Comments: Bryan Knight is a 69 y.o. male with a hx of hypertension, DM, and hypothyroidism who presents to the [facility] for a follow-up. Patient has been taking his blood pressure and blood sugar regularly at home and has brought his readings into the office. He has stopped the Clonidine patches. He states his energy levels have been about the same. Patient reports staying physically active.  Patient's family history is positive for hypertension. His mother left until she was in her late 76s and died of liver cancer.  Patient has seen multiple doctors over the years. He started out with Dr. Sherren Mocha and Dr. Redmond Pulling, then saw Dr. Juventino Slovak and then Dr. Salem Senate and but dissipated in a clinic at [facility] in [place]. At no time was blood pressure well controlled.  Lab Results  Component Value Date   HGBA1C 6.2 11/26/2014    Review of Systems No dizziness or chest pain, no significant headaches or edema    Objective:   Physical Exam CONSTITUTIONAL: Well developed/well nourished HEAD: Normocephalic/atraumatic EYES: EOM/PERRL ENMT: Mucous membranes moist NECK: supple no meningeal signs SPINE: entire spine nontender CV: S1/S2 noted, no murmurs/rubs/gallops noted. Repeat blood pressure: 172/92. LUNGS: Lungs are clear to auscultation bilaterally, no apparent distress ABDOMEN: soft, nontender, no rebound or guarding GU: no cva tenderness NEURO: Pt is awake/alert, moves all extremitiesx4 EXTREMITIES: pulses normal, full ROM, no edema SKIN: warm, color normal, moderate rhinophyma PSYCH: no abnormalities of mood noted Results for orders placed or performed in visit on 02/11/15  POCT glycosylated hemoglobin  (Hb A1C)  Result Value Ref Range   Hemoglobin A1C 6.2    foot exam completed    Assessment & Plan:   This chart was scribed in my presence and reviewed by me personally.    ICD-9-CM ICD-10-CM   1. Essential hypertension 401.9 I10 lisinopril-hydrochlorothiazide (PRINZIDE,ZESTORETIC) 20-12.5 MG per tablet  2. Type 2 diabetes mellitus, controlled 250.00 E11.9 HM Diabetes Foot Exam     POCT glycosylated hemoglobin (Hb A1C)   At this point, patient has had multiple evaluations and treatment combinations for his blood pressure. It appears that the clonidine, having been stopped 2 weeks ago, is not having much of an effect. I feel comfortable with a blood pressure at its current level given the fact that he's tried some different things without success.  Recheck in 6 months, call if there are any new problems.  Signed, [name], MD

## 2015-02-17 DIAGNOSIS — H4011X1 Primary open-angle glaucoma, mild stage: Secondary | ICD-10-CM | POA: Diagnosis not present

## 2015-02-17 LAB — HM DIABETES EYE EXAM

## 2015-03-26 ENCOUNTER — Other Ambulatory Visit: Payer: Self-pay | Admitting: Family Medicine

## 2015-03-30 ENCOUNTER — Encounter: Payer: Self-pay | Admitting: Family Medicine

## 2015-04-02 ENCOUNTER — Ambulatory Visit (INDEPENDENT_AMBULATORY_CARE_PROVIDER_SITE_OTHER): Payer: Medicare Other | Admitting: Physician Assistant

## 2015-04-02 VITALS — BP 158/94 | HR 108 | Temp 97.8°F | Resp 20 | Ht 69.0 in | Wt 167.0 lb

## 2015-04-02 DIAGNOSIS — R809 Proteinuria, unspecified: Secondary | ICD-10-CM | POA: Diagnosis not present

## 2015-04-02 DIAGNOSIS — F329 Major depressive disorder, single episode, unspecified: Secondary | ICD-10-CM

## 2015-04-02 DIAGNOSIS — Z1211 Encounter for screening for malignant neoplasm of colon: Secondary | ICD-10-CM | POA: Diagnosis not present

## 2015-04-02 DIAGNOSIS — I10 Essential (primary) hypertension: Secondary | ICD-10-CM

## 2015-04-02 DIAGNOSIS — F32A Depression, unspecified: Secondary | ICD-10-CM | POA: Insufficient documentation

## 2015-04-02 LAB — CBC WITH DIFFERENTIAL/PLATELET
BASOS ABS: 0.1 10*3/uL (ref 0.0–0.1)
Basophils Relative: 1 % (ref 0–1)
Eosinophils Absolute: 0.4 10*3/uL (ref 0.0–0.7)
Eosinophils Relative: 4 % (ref 0–5)
HCT: 43.5 % (ref 39.0–52.0)
Hemoglobin: 15.2 g/dL (ref 13.0–17.0)
Lymphocytes Relative: 20 % (ref 12–46)
Lymphs Abs: 1.8 10*3/uL (ref 0.7–4.0)
MCH: 28.8 pg (ref 26.0–34.0)
MCHC: 34.9 g/dL (ref 30.0–36.0)
MCV: 82.4 fL (ref 78.0–100.0)
MONO ABS: 1.2 10*3/uL — AB (ref 0.1–1.0)
MPV: 9.5 fL (ref 8.6–12.4)
Monocytes Relative: 13 % — ABNORMAL HIGH (ref 3–12)
NEUTROS ABS: 5.6 10*3/uL (ref 1.7–7.7)
Neutrophils Relative %: 62 % (ref 43–77)
PLATELETS: 313 10*3/uL (ref 150–400)
RBC: 5.28 MIL/uL (ref 4.22–5.81)
RDW: 14.3 % (ref 11.5–15.5)
WBC: 9.1 10*3/uL (ref 4.0–10.5)

## 2015-04-02 LAB — COMPREHENSIVE METABOLIC PANEL
ALK PHOS: 49 U/L (ref 39–117)
ALT: 19 U/L (ref 0–53)
AST: 20 U/L (ref 0–37)
Albumin: 4.6 g/dL (ref 3.5–5.2)
BUN: 24 mg/dL — ABNORMAL HIGH (ref 6–23)
CO2: 26 mEq/L (ref 19–32)
Calcium: 9.6 mg/dL (ref 8.4–10.5)
Chloride: 97 mEq/L (ref 96–112)
Creat: 1.55 mg/dL — ABNORMAL HIGH (ref 0.50–1.35)
GLUCOSE: 177 mg/dL — AB (ref 70–99)
Potassium: 3.4 mEq/L — ABNORMAL LOW (ref 3.5–5.3)
Sodium: 138 mEq/L (ref 135–145)
Total Bilirubin: 0.4 mg/dL (ref 0.2–1.2)
Total Protein: 7.4 g/dL (ref 6.0–8.3)

## 2015-04-02 MED ORDER — DULOXETINE HCL 30 MG PO CPEP: 30.0000 mg | ORAL_CAPSULE | Freq: Every day | ORAL | Status: DC

## 2015-04-02 NOTE — Progress Notes (Signed)
Patient ID: Bryan Knight, male    DOB: 1946-08-16, 69 y.o.   MRN: [id number]  PCP: [name], MD  Subjective:   Chief Complaint  Patient presents with  . Medication Refill    Duloxetine (ran out last week)    HPI Presents for medication refill.  He ran out of Cymbalta about a week ago. The mail order request has been sent, and he's tracking it, but it's not arrived. He's starting to have significant trouble sleeping. Last night only slept about 3 hours. Recalls being told that he should not run out of Cymbalta, that it needs to be weaned off if he is going to stop it.  I note his BP is elevated, though better than at his last visit. He reports that he's been dealing with HTN for 40 years, has been on multiple regimens and seen multiple specialists. Some regimens have worked for a while and then lost effectiveness. He reports even participating in several studies, to no avail.  Review of labs reveal well controlled diabetes, but renal insufficiency and microalbuminuria. Last renal function testing was 08/2014. He was last seen by his PCP in 01/2015, and is to follow-up in 6 months. Health maintenance updated today-the only outstanding item is colonoscopy. Last colonoscopy was 2004 or 2005.  He feels great. No CP, SOB, HA, dizziness.    Review of Systems  Constitutional: Negative for fever, fatigue and unexpected weight change.  Eyes: Negative for visual disturbance.  Respiratory: Negative for cough, shortness of breath and wheezing.   Cardiovascular: Negative for chest pain and palpitations.  Gastrointestinal: Negative for nausea, vomiting and diarrhea.  Genitourinary: Negative for dysuria, urgency, frequency and hematuria.  Musculoskeletal: Negative for myalgias and arthralgias.  Neurological: Negative for dizziness, weakness and headaches.  Psychiatric/Behavioral: Positive for sleep disturbance and decreased concentration. Negative for suicidal ideas.       Patient  Active Problem List   Diagnosis Date Noted  . Microalbuminuria 04/02/2015  . Depression 04/02/2015  . Foreign body in esophagus 10/24/2013  . B12 deficiency 01/11/2012  . Hypertension 10/20/2011  . Diabetes mellitus type II, controlled 10/20/2011  . Hypothyroid 10/20/2011  . Rosacea 10/20/2011     Prior to Admission medications   Medication Sig Start Date End Date Taking? Authorizing Provider  amLODipine (NORVASC) 10 MG tablet Take 1 tablet (10 mg total) by mouth daily. 01/27/15  Yes [name], MD  aspirin 81 MG tablet Take 160 mg by mouth daily.   Yes Historical Provider, MD  DULoxetine (CYMBALTA) 30 MG capsule TAKE 1 CAPSULE DAILY. 03/30/15  Yes Bennett Scrape V, PA-C  glucose blood (ONETOUCH VERIO) test strip Test blood sugar daily. Dx code: E11.9 10/29/14  Yes [name], MD  latanoprost (XALATAN) 0.005 % ophthalmic solution Place 1 drop into both eyes at bedtime.   Yes Historical Provider, MD  levothyroxine (SYNTHROID, LEVOTHROID) 75 MCG tablet Take 1 tablet (75 mcg total) by mouth daily. 04/07/14  Yes [name], MD  lisinopril-hydrochlorothiazide (PRINZIDE,ZESTORETIC) 20-12.5 MG per tablet Take 1 tablet by mouth daily. 02/11/15  Yes [name], MD  metoprolol (TOPROL-XL) 200 MG 24 hr tablet Take 200 mg by mouth daily.   Yes Historical Provider, MD  montelukast (SINGULAIR) 10 MG tablet Take 1 tablet (10 mg total) by mouth at bedtime. 11/26/14  Yes [name], MD  [facility] DELICA LANCETS FINE MISC USE TO TEST ONCE DAILY AS DIRECTED 01/26/15  Yes [name], MD  sitaGLIPtin-metformin (JANUMET) 50-1000 MG per tablet Take 1 tablet by  mouth 2 (two) times daily with a meal. 04/06/14  Yes [name], MD  triamcinolone cream (KENALOG) 0.1 % Apply 1 application topically 2 (two) times daily. 11/26/14  Yes [name], MD     Allergies  Allergen Reactions  . Citalopram   . Tussionex Pennkinetic Er [Hydrocod Polst-Cpm Polst Er] Other (See Comments)    Foggy  headed  . Wellbutrin [Bupropion Hcl] Other (See Comments)    Sluggish        Objective:  Physical Exam  Constitutional: He is oriented to person, place, and time. He appears well-developed and well-nourished. He is active and cooperative. No distress.  BP 158/94 mmHg  Pulse 108  Temp(Src) 97.8 F (36.6 C)  Resp 20  Ht 5\' 9"  (1.753 m)  Wt 167 lb (75.751 kg)  BMI 24.65 kg/m2  SpO2 99%   Eyes: Conjunctivae are normal.  Pulmonary/Chest: Effort normal.  Neurological: He is alert and oriented to person, place, and time.  Skin: Skin is warm and dry.  Psychiatric: He has a normal mood and affect. His speech is normal and behavior is normal.           Assessment & Plan:   1. Essential hypertension Uncontrolled. Long-standing uncontrolled, despite what sounds like considerable efforts. Update CBC and CMET. If creatinine has worsened, may need adjustment in diabetes management (he's on metformin and sitagliptan). Would consider re-evaluation with nephrology. - Comprehensive metabolic panel - CBC with Differential/Platelet  2. Microalbuminuria See above.  3. Depression Controlled. 7-day supply of duloxetine to last until his mail-order supply arrives. - DULoxetine (CYMBALTA) 30 MG capsule; Take 1 capsule (30 mg total) by mouth daily.  Dispense: 7 capsule; Refill: 0  4. Screening for colon cancer Referral to GI for colon cancer screening.   Fara Chute, PA-C Physician Assistant-Certified [facility]

## 2015-04-02 NOTE — Patient Instructions (Signed)
I will contact you with your lab results as soon as they are available.   If you have not heard from me in 2 weeks, please contact me.  The fastest way to get your results is to register for My Chart (see the instructions on the last page of this printout).   

## 2015-04-05 ENCOUNTER — Ambulatory Visit: Payer: Medicare Other

## 2015-04-06 ENCOUNTER — Ambulatory Visit (INDEPENDENT_AMBULATORY_CARE_PROVIDER_SITE_OTHER): Payer: Medicare Other | Admitting: Podiatry

## 2015-04-06 ENCOUNTER — Encounter: Payer: Self-pay | Admitting: Physician Assistant

## 2015-04-06 ENCOUNTER — Other Ambulatory Visit: Payer: Self-pay | Admitting: Physician Assistant

## 2015-04-06 ENCOUNTER — Encounter: Payer: Self-pay | Admitting: Podiatry

## 2015-04-06 DIAGNOSIS — M79673 Pain in unspecified foot: Secondary | ICD-10-CM

## 2015-04-06 DIAGNOSIS — E119 Type 2 diabetes mellitus without complications: Secondary | ICD-10-CM

## 2015-04-06 DIAGNOSIS — I10 Essential (primary) hypertension: Secondary | ICD-10-CM

## 2015-04-06 DIAGNOSIS — B351 Tinea unguium: Secondary | ICD-10-CM

## 2015-04-06 MED ORDER — SITAGLIPTIN PHOSPHATE 100 MG PO TABS: 100.0000 mg | ORAL_TABLET | Freq: Every day | ORAL | Status: DC

## 2015-04-06 NOTE — Progress Notes (Signed)
  Medical screening examination/treatment/procedure(s) were performed by non-physician practitioner and as supervising physician I was immediately available for consultation/collaboration.     

## 2015-04-06 NOTE — Progress Notes (Signed)
Patient ID: Bryan Knight, male   DOB: 07-Nov-1945, 69 y.o.   MRN: [id number]  Complaint:  Visit Type: Patient returns to my office for continued preventative foot care services. Complaint: Patient states" my nails have grown long and thick and become painful to walk and wear shoes" Patient has been diagnosed with DM with no complications. He presents for preventative foot care services. No changes to ROS  Podiatric Exam: Vascular: dorsalis pedis and posterior tibial pulses are palpable bilateral. Capillary return is immediate. Temperature gradient is WNL. Skin turgor WNL  Sensorium: Normal Semmes Weinstein monofilament test. Normal tactile sensation bilaterally. Nail Exam: Pt has thick disfigured discolored nails with subungual debris noted bilateral entire nail hallux through fifth toenails Ulcer Exam: There is no evidence of ulcer or pre-ulcerative changes or infection. Orthopedic Exam: Muscle tone and strength are WNL. No limitations in general ROM. No crepitus or effusions noted. Foot type and digits show no abnormalities. Bony prominences are unremarkable. Skin: No Porokeratosis. No infection or ulcers  Diagnosis:  Tinea unguium, Pain in right toe, pain in left toes  Treatment & Plan Procedures and Treatment: Consent by patient was obtained for treatment procedures. The patient understood the discussion of treatment and procedures well. All questions were answered thoroughly reviewed. Debridement of mycotic and hypertrophic toenails, 1 through 5 bilateral and clearing of subungual debris. No ulceration, no infection noted.  Return Visit-Office Procedure: Patient instructed to return to the office for a follow up visit 3 months for continued evaluation and treatment.

## 2015-04-12 ENCOUNTER — Encounter: Payer: Self-pay | Admitting: Physician Assistant

## 2015-04-29 ENCOUNTER — Other Ambulatory Visit: Payer: Self-pay | Admitting: Physician Assistant

## 2015-05-06 ENCOUNTER — Encounter: Payer: Self-pay | Admitting: Physician Assistant

## 2015-05-06 ENCOUNTER — Ambulatory Visit (INDEPENDENT_AMBULATORY_CARE_PROVIDER_SITE_OTHER): Payer: Medicare Other | Admitting: Physician Assistant

## 2015-05-06 VITALS — BP 176/90 | HR 89 | Temp 97.7°F | Resp 16 | Ht 69.0 in | Wt 166.2 lb

## 2015-05-06 DIAGNOSIS — Z1159 Encounter for screening for other viral diseases: Secondary | ICD-10-CM | POA: Diagnosis not present

## 2015-05-06 DIAGNOSIS — E119 Type 2 diabetes mellitus without complications: Secondary | ICD-10-CM | POA: Diagnosis not present

## 2015-05-06 DIAGNOSIS — E039 Hypothyroidism, unspecified: Secondary | ICD-10-CM | POA: Diagnosis not present

## 2015-05-06 DIAGNOSIS — R809 Proteinuria, unspecified: Secondary | ICD-10-CM | POA: Diagnosis not present

## 2015-05-06 DIAGNOSIS — I10 Essential (primary) hypertension: Secondary | ICD-10-CM | POA: Diagnosis not present

## 2015-05-06 LAB — POCT GLYCOSYLATED HEMOGLOBIN (HGB A1C): Hemoglobin A1C: 6.5

## 2015-05-06 LAB — GLUCOSE, POCT (MANUAL RESULT ENTRY): POC GLUCOSE: 165 mg/dL — AB (ref 70–99)

## 2015-05-06 LAB — MICROALBUMIN, URINE: MICROALB UR: 109.8 mg/dL — AB (ref <2.0)

## 2015-05-06 LAB — HEPATITIS C ANTIBODY: HCV Ab: NEGATIVE

## 2015-05-06 MED ORDER — LEVOTHYROXINE SODIUM 75 MCG PO TABS: 75.0000 ug | ORAL_TABLET | Freq: Every day | ORAL | Status: DC

## 2015-05-06 NOTE — Patient Instructions (Signed)
Please schedule the colonoscopy!

## 2015-05-06 NOTE — Progress Notes (Signed)
Patient ID: Bryan Knight, male    DOB: October 14, 1945, 69 y.o.   MRN: [id number]  PCP: [name], MD  Subjective:   Chief Complaint  Patient presents with  . Follow-up    1 month follow up on januvia    HPI Presents for evaluation of diabetes since medication change 4 weeks ago.  Since changing from Janumet to Foley, fasting home glucose readings have risen a bit, from 120's-140's to 140's-160's. He's tolerating it well, without adverse effects, and isn't experiencing polyuria, polydipsia, nausea, increased fatigue.  "I feel skinnier, but I'm not, I've lost a pound." Walking 30-45 minutes/day. Has previously been told that he should walk 10 miles/day, which he did for months. Even with that, his BP and glucose numbers didn't improve. Eats junk food that his wife buys for when their grandsons visit. Drinks Diet Pepsi. Wonders if that is problematic.  "I told a lie last time. I told you that I was taking the clonidine. I quit using the clonidine a few months back." We had actually discussed that, and I was aware that he was not taking the clonidine at his most recent visit.  Referred to [facility]. They called 04/09/2015. His visit will be in the fall, as review of his referral indicates that it's not urgent.   Review of Systems As above.    Patient Active Problem List   Diagnosis Date Noted  . Microalbuminuria 04/02/2015  . Depression 04/02/2015  . Foreign body in esophagus 10/24/2013  . B12 deficiency 01/11/2012  . Hypertension 10/20/2011  . Diabetes mellitus type II, controlled 10/20/2011  . Hypothyroid 10/20/2011  . Rosacea 10/20/2011     Prior to Admission medications   Medication Sig Start Date End Date Taking? Authorizing Provider  amLODipine (NORVASC) 10 MG tablet Take 1 tablet (10 mg total) by mouth daily. 01/27/15  Yes [name], MD  aspirin 81 MG tablet Take 160 mg by mouth daily.   Yes Historical Provider, MD  DULoxetine (CYMBALTA)  30 MG capsule Take 1 capsule (30 mg total) by mouth daily. 04/02/15  Yes Bonner Larue, PA-C  glucose blood (ONETOUCH VERIO) test strip Test blood sugar daily. Dx code: E11.9 10/29/14  Yes [name], MD  latanoprost (XALATAN) 0.005 % ophthalmic solution Place 1 drop into both eyes at bedtime.   Yes Historical Provider, MD  levothyroxine (SYNTHROID, LEVOTHROID) 75 MCG tablet Take 1 tablet (75 mcg total) by mouth daily. 04/07/14  Yes [name], MD  lisinopril-hydrochlorothiazide (PRINZIDE,ZESTORETIC) 20-12.5 MG per tablet Take 1 tablet by mouth daily. 02/11/15  Yes [name], MD  montelukast (SINGULAIR) 10 MG tablet Take 1 tablet (10 mg total) by mouth at bedtime. 11/26/14  Yes [name], MD  [facility] DELICA LANCETS FINE MISC USE TO TEST ONCE DAILY AS DIRECTED 01/26/15  Yes [name], MD  sitaGLIPtin (JANUVIA) 100 MG tablet Take 1 tablet (100 mg total) by mouth daily. 04/06/15  Yes Suzanne Kho, PA-C  DULoxetine (CYMBALTA) 30 MG capsule TAKE 1 CAPSULE DAILY 04/29/15   Navy Rothschild, PA-C  metoprolol (TOPROL-XL) 200 MG 24 hr tablet Take 200 mg by mouth daily.    Historical Provider, MD  triamcinolone cream (KENALOG) 0.1 % Apply 1 application topically 2 (two) times daily. Patient not taking: Reported on 05/06/2015 11/26/14   [name], MD     Allergies  Allergen Reactions  . Citalopram   . Tussionex Pennkinetic Er [Hydrocod Polst-Cpm Polst Er] Other (See Comments)    Foggy headed  . Wellbutrin [  Bupropion Hcl] Other (See Comments)    Sluggish        Objective:  Physical Exam  Constitutional: He is oriented to person, place, and time. Vital signs are normal. He appears well-developed and well-nourished. He is active and cooperative. No distress.  BP 176/90 mmHg  Pulse 89  Temp(Src) 97.7 F (36.5 C) (Oral)  Resp 16  Ht 5\' 9"  (1.753 m)  Wt 166 lb 3.2 oz (75.388 kg)  BMI 24.53 kg/m2  SpO2 99%  HENT:  Head: Normocephalic and atraumatic.  Right Ear: Hearing  normal.  Left Ear: Hearing normal.  Eyes: Conjunctivae are normal. No scleral icterus.  Neck: Normal range of motion. Neck supple. No thyromegaly present.  Cardiovascular: Normal rate, regular rhythm and normal heart sounds.   Pulses:      Radial pulses are 2+ on the right side, and 2+ on the left side.  Pulmonary/Chest: Effort normal and breath sounds normal.  Lymphadenopathy:       Head (right side): No tonsillar, no preauricular, no posterior auricular and no occipital adenopathy present.       Head (left side): No tonsillar, no preauricular, no posterior auricular and no occipital adenopathy present.    He has no cervical adenopathy.       Right: No supraclavicular adenopathy present.       Left: No supraclavicular adenopathy present.  Neurological: He is alert and oriented to person, place, and time. No sensory deficit.  Skin: Skin is warm, dry and intact. No rash noted. No cyanosis or erythema. Nails show no clubbing.  Psychiatric: He has a normal mood and affect.    Results for orders placed or performed in visit on 05/06/15  POCT glucose (manual entry)  Result Value Ref Range   POC Glucose 165 (A) 70 - 99 mg/dl  POCT glycosylated hemoglobin (Hb A1C)  Result Value Ref Range   Hemoglobin A1C 6.5           Assessment & Plan:   1. Microalbuminuria Await labs. Continue lisinoprilHCT - Microalbumin, urine  2. Diabetes mellitus type II, controlled Remains controlled, but fasting readings are up about 20 points. We discussed the addition of another agent, but elect to first try reducing the junk food snacks. - POCT glucose (manual entry) - POCT glycosylated hemoglobin (Hb A1C)  3. Essential hypertension Uncontrolled for years. He'll continue the current treatment of lisinoprilHCT, metoprolol and see nephrology in the fall.  4. Hypothyroidism, unspecified hypothyroidism type Has remained stable. Continue current treatment. - levothyroxine (SYNTHROID, LEVOTHROID) 75 MCG  tablet; Take 1 tablet (75 mcg total) by mouth daily.  Dispense: 90 tablet; Refill: 3  5. Need for hepatitis C screening test - Hepatitis C antibody  Return in about 3 months (around 08/06/2015) for re-evaluation of diabetes and blood pressure.    Fara Chute, PA-C Physician Assistant-Certified [facility]

## 2015-05-10 ENCOUNTER — Other Ambulatory Visit: Payer: Self-pay | Admitting: Family Medicine

## 2015-05-19 ENCOUNTER — Other Ambulatory Visit: Payer: Self-pay | Admitting: Family Medicine

## 2015-06-17 ENCOUNTER — Other Ambulatory Visit: Payer: Self-pay

## 2015-06-17 MED ORDER — MONTELUKAST SODIUM 10 MG PO TABS: ORAL_TABLET | ORAL | Status: DC

## 2015-07-06 ENCOUNTER — Ambulatory Visit (INDEPENDENT_AMBULATORY_CARE_PROVIDER_SITE_OTHER): Payer: Medicare Other | Admitting: Podiatry

## 2015-07-06 ENCOUNTER — Encounter: Payer: Self-pay | Admitting: Podiatry

## 2015-07-06 DIAGNOSIS — M79673 Pain in unspecified foot: Secondary | ICD-10-CM | POA: Diagnosis not present

## 2015-07-06 DIAGNOSIS — B351 Tinea unguium: Secondary | ICD-10-CM | POA: Diagnosis not present

## 2015-07-06 NOTE — Progress Notes (Signed)
Patient ID: Bryan Knight, male   DOB: 01-10-1946, 69 y.o.   MRN: [id number]  Complaint:  Visit Type: Patient returns to my office for continued preventative foot care services. Complaint: Patient states" my nails have grown long and thick and become painful to walk and wear shoes" Patient has been diagnosed with DM with no complications. He presents for preventative foot care services. No changes to ROS  Podiatric Exam: Vascular: dorsalis pedis and posterior tibial pulses are palpable bilateral. Capillary return is immediate. Temperature gradient is WNL. Skin turgor WNL  Sensorium: Normal Semmes Weinstein monofilament test. Normal tactile sensation bilaterally. Nail Exam: Pt has thick disfigured discolored nails with subungual debris noted bilateral entire nail hallux through fifth toenails Ulcer Exam: There is no evidence of ulcer or pre-ulcerative changes or infection. Orthopedic Exam: Muscle tone and strength are WNL. No limitations in general ROM. No crepitus or effusions noted. Foot type and digits show no abnormalities. Bony prominences are unremarkable. Skin: No Porokeratosis. No infection or ulcers  Diagnosis:  Tinea unguium, Pain in right toe, pain in left toes  Treatment & Plan Procedures and Treatment: Consent by patient was obtained for treatment procedures. The patient understood the discussion of treatment and procedures well. All questions were answered thoroughly reviewed. Debridement of mycotic and hypertrophic toenails, 1 through 5 bilateral and clearing of subungual debris. No ulceration, no infection noted.  Return Visit-Office Procedure: Patient instructed to return to the office for a follow up visit 3 months for continued evaluation and treatment.

## 2015-08-10 ENCOUNTER — Encounter: Payer: Self-pay | Admitting: Physician Assistant

## 2015-08-10 ENCOUNTER — Ambulatory Visit (INDEPENDENT_AMBULATORY_CARE_PROVIDER_SITE_OTHER): Payer: Medicare Other | Admitting: Physician Assistant

## 2015-08-10 ENCOUNTER — Telehealth: Payer: Self-pay | Admitting: Physician Assistant

## 2015-08-10 VITALS — BP 166/100 | HR 82 | Temp 98.3°F | Resp 16 | Ht 67.5 in | Wt 172.8 lb

## 2015-08-10 DIAGNOSIS — E538 Deficiency of other specified B group vitamins: Secondary | ICD-10-CM | POA: Diagnosis not present

## 2015-08-10 DIAGNOSIS — E039 Hypothyroidism, unspecified: Secondary | ICD-10-CM

## 2015-08-10 DIAGNOSIS — R809 Proteinuria, unspecified: Secondary | ICD-10-CM | POA: Diagnosis not present

## 2015-08-10 DIAGNOSIS — I10 Essential (primary) hypertension: Secondary | ICD-10-CM

## 2015-08-10 DIAGNOSIS — I499 Cardiac arrhythmia, unspecified: Secondary | ICD-10-CM

## 2015-08-10 DIAGNOSIS — E785 Hyperlipidemia, unspecified: Secondary | ICD-10-CM | POA: Diagnosis not present

## 2015-08-10 DIAGNOSIS — I517 Cardiomegaly: Secondary | ICD-10-CM

## 2015-08-10 DIAGNOSIS — E119 Type 2 diabetes mellitus without complications: Secondary | ICD-10-CM | POA: Diagnosis not present

## 2015-08-10 DIAGNOSIS — IMO0001 Reserved for inherently not codable concepts without codable children: Secondary | ICD-10-CM

## 2015-08-10 DIAGNOSIS — E1165 Type 2 diabetes mellitus with hyperglycemia: Secondary | ICD-10-CM | POA: Diagnosis not present

## 2015-08-10 LAB — CBC WITH DIFFERENTIAL/PLATELET
BASOS ABS: 0 10*3/uL (ref 0.0–0.1)
Basophils Relative: 0 % (ref 0–1)
Eosinophils Absolute: 0.2 10*3/uL (ref 0.0–0.7)
Eosinophils Relative: 2 % (ref 0–5)
HCT: 43.5 % (ref 39.0–52.0)
HEMOGLOBIN: 15.2 g/dL (ref 13.0–17.0)
LYMPHS ABS: 1.8 10*3/uL (ref 0.7–4.0)
LYMPHS PCT: 19 % (ref 12–46)
MCH: 29.1 pg (ref 26.0–34.0)
MCHC: 34.9 g/dL (ref 30.0–36.0)
MCV: 83.3 fL (ref 78.0–100.0)
MONOS PCT: 11 % (ref 3–12)
MPV: 10 fL (ref 8.6–12.4)
Monocytes Absolute: 1.1 10*3/uL — ABNORMAL HIGH (ref 0.1–1.0)
NEUTROS ABS: 6.5 10*3/uL (ref 1.7–7.7)
NEUTROS PCT: 68 % (ref 43–77)
Platelets: 318 10*3/uL (ref 150–400)
RBC: 5.22 MIL/uL (ref 4.22–5.81)
RDW: 13.5 % (ref 11.5–15.5)
WBC: 9.6 10*3/uL (ref 4.0–10.5)

## 2015-08-10 LAB — LIPID PANEL
CHOLESTEROL: 252 mg/dL — AB (ref 125–200)
HDL: 53 mg/dL (ref >=40)
LDL Cholesterol: 136 mg/dL — ABNORMAL HIGH (ref <130)
TRIGLYCERIDES: 315 mg/dL — AB (ref <150)
Total CHOL/HDL Ratio: 4.8 Ratio (ref <=5.0)
VLDL: 63 mg/dL — ABNORMAL HIGH (ref <30)

## 2015-08-10 LAB — COMPREHENSIVE METABOLIC PANEL
ALBUMIN: 4.4 g/dL (ref 3.6–5.1)
ALK PHOS: 56 U/L (ref 40–115)
ALT: 19 U/L (ref 9–46)
AST: 19 U/L (ref 10–35)
BILIRUBIN TOTAL: 0.5 mg/dL (ref 0.2–1.2)
BUN: 30 mg/dL — ABNORMAL HIGH (ref 7–25)
CALCIUM: 9.3 mg/dL (ref 8.6–10.3)
CO2: 29 mmol/L (ref 20–31)
Chloride: 96 mmol/L — ABNORMAL LOW (ref 98–110)
Creat: 1.6 mg/dL — ABNORMAL HIGH (ref 0.70–1.25)
Glucose, Bld: 232 mg/dL — ABNORMAL HIGH (ref 65–99)
Potassium: 3.3 mmol/L — ABNORMAL LOW (ref 3.5–5.3)
Sodium: 137 mmol/L (ref 135–146)
TOTAL PROTEIN: 7.2 g/dL (ref 6.1–8.1)

## 2015-08-10 LAB — TSH: TSH: 1.948 u[IU]/mL (ref 0.350–4.500)

## 2015-08-10 LAB — POCT GLYCOSYLATED HEMOGLOBIN (HGB A1C): Hemoglobin A1C: 7.9

## 2015-08-10 LAB — GLUCOSE, POCT (MANUAL RESULT ENTRY): POC Glucose: 219 mg/dl — AB (ref 70–99)

## 2015-08-10 LAB — HEMOGLOBIN A1C: HEMOGLOBIN A1C: 7.9 % — AB (ref 4.0–6.0)

## 2015-08-10 MED ORDER — METOPROLOL SUCCINATE ER 100 MG PO TB24: 100.0000 mg | ORAL_TABLET | Freq: Every day | ORAL | Status: DC

## 2015-08-10 NOTE — Patient Instructions (Signed)
[facility] is behind in scheduling their referrals.  They said that you can expect to hear from them in the next 30 days to schedule an appointment.  Please don't forget to schedule a colonoscopy!

## 2015-08-10 NOTE — Progress Notes (Signed)
Patient ID: Bryan Knight, male    DOB: 1946-05-04, 69 y.o.   MRN: [id number]  PCP: [name], MD  Subjective:   Chief Complaint  Patient presents with  . Follow-up    3 mos  . Diabetes  . Hypertension  . fingers on the right hand    "hard to make a fist" x 6 wks    HPI Presents for evaluation of HTN, chronically uncontrolled, diabetes, previously well controlled.  Unfortunately, he did not mention the problem with his RIGHT hand, and I did not ask about that during his visit.  Blood pressure seems higher since switching from Janumet to Januvia (due to elevated Creatinine) Blood sugars have also gone up, now all >150.   Review of Systems Denies chest pain, shortness of breath, HA, dizziness, vision change, nausea, vomiting, diarrhea, constipation, melena, hematochezia, dysuria, increased urinary urgency or frequency, increased hunger or thirst, unintentional weight change, unexplained rash.     Patient Active Problem List   Diagnosis Date Noted  . Microalbuminuria 04/02/2015  . Depression 04/02/2015  . Foreign body in esophagus 10/24/2013  . B12 deficiency 01/11/2012  . Hypertension 10/20/2011  . Diabetes mellitus type II, controlled ([place]) 10/20/2011  . Hypothyroid 10/20/2011  . Rosacea 10/20/2011     Prior to Admission medications   Medication Sig Start Date End Date Taking? Authorizing Provider  amLODipine (NORVASC) 10 MG tablet Take 1 tablet (10 mg total) by mouth daily. 01/27/15  Yes [name], MD  aspirin 81 MG tablet Take 160 mg by mouth daily.   Yes Historical Provider, MD  DULoxetine (CYMBALTA) 30 MG capsule Take 1 capsule (30 mg total) by mouth daily. 04/02/15  Yes Legend Tumminello, PA-C  DULoxetine (CYMBALTA) 30 MG capsule TAKE 1 CAPSULE DAILY 04/29/15  Yes Alexande Sheerin, PA-C  glucose blood (ONETOUCH VERIO) test strip Test blood sugar daily. Dx code: E11.9 10/29/14  Yes [name], MD  latanoprost (XALATAN) 0.005 % ophthalmic solution Place  1 drop into both eyes at bedtime.   Yes Historical Provider, MD  levothyroxine (SYNTHROID, LEVOTHROID) 75 MCG tablet Take 1 tablet (75 mcg total) by mouth daily. 05/06/15  Yes Aadyn Buchheit, PA-C  lisinopril-hydrochlorothiazide (PRINZIDE,ZESTORETIC) 20-12.5 MG per tablet Take 1 tablet by mouth daily. 02/11/15  Yes [name], MD  montelukast (SINGULAIR) 10 MG tablet TAKE ONE TABLET BY MOUTH NIGHTLY AT BEDTIME 06/17/15  Yes Dema Timmons, PA-C  ONETOUCH DELICA LANCETS FINE MISC USE TO TEST ONCE DAILY AS DIRECTED 05/21/15  Yes Zurich Carreno, PA-C  sitaGLIPtin (JANUVIA) 100 MG tablet Take 1 tablet (100 mg total) by mouth daily. 04/06/15  Yes Harrison Mons, PA-C     Allergies  Allergen Reactions  . Citalopram   . Tussionex Pennkinetic Er [Hydrocod Polst-Cpm Polst Er] Other (See Comments)    Foggy headed  . Wellbutrin [Bupropion Hcl] Other (See Comments)    Sluggish        Objective:  Physical Exam  Constitutional: He is oriented to person, place, and time. Vital signs are normal. He appears well-developed and well-nourished. He is active and cooperative. No distress.  BP 166/100 mmHg  Pulse 82  Temp(Src) 98.3 F (36.8 C) (Oral)  Resp 16  Ht 5' 7.5" (1.715 m)  Wt 172 lb 12.8 oz (78.382 kg)  BMI 26.65 kg/m2  SpO2 100%  HENT:  Head: Normocephalic and atraumatic.  Right Ear: Hearing normal.  Left Ear: Hearing normal.  Eyes: Conjunctivae are normal. No scleral icterus.  Neck: Normal range of motion. Neck  supple. No thyromegaly present.  Cardiovascular: Normal rate and normal heart sounds.  An irregular rhythm present.  Pulses:      Radial pulses are 2+ on the right side, and 2+ on the left side.  Pulmonary/Chest: Effort normal and breath sounds normal.  Lymphadenopathy:       Head (right side): No tonsillar, no preauricular, no posterior auricular and no occipital adenopathy present.       Head (left side): No tonsillar, no preauricular, no posterior auricular and no occipital  adenopathy present.    He has no cervical adenopathy.       Right: No supraclavicular adenopathy present.       Left: No supraclavicular adenopathy present.  Neurological: He is alert and oriented to person, place, and time. No sensory deficit.  Skin: Skin is warm, dry and intact. No rash noted. No cyanosis or erythema. Nails show no clubbing.  Psychiatric: He has a normal mood and affect.    EKG reviewed with Dr. Everlene Farrier. LVH. Occasional PVCs. No strain pattern. No ischemia.   Results for orders placed or performed in visit on 08/10/15  POCT glucose (manual entry)  Result Value Ref Range   POC Glucose 219 (A) 70 - 99 mg/dl  POCT glycosylated hemoglobin (Hb A1C)  Result Value Ref Range   Hemoglobin A1C 7.9        Assessment & Plan:   1. Controlled type 2 diabetes mellitus without complication, without long-term current use of insulin ([place]) Await lab results. Expect rise in A1C. May need to add another agent. If creatinine improved, consider restarting metformin, since it worked well previously, but at 500 mg BID instead of 1000 mg BID. eGFR 49.29 in July 2016. Repeat 1 month after restarting metformin. - POCT glucose (manual entry) - POCT glycosylated hemoglobin (Hb A1C) - Lipid panel  2. Essential hypertension Uncontrolled, long-standing. Nephrology visit was to be this fall, but they are exceptionally busy and expect to call him in the next month to schedule. Await lab results. Plan to restart Toprol XL (he doesn't recall being on it before or when/why it was stopped, but is willing to try it now). - Comprehensive metabolic panel - TSH - CBC with Differential/Platelet  3. Microalbuminuria Due to uncontrolled HTN.  4. Hypothyroidism, unspecified hypothyroidism type Await lab results. - TSH  5. B12 deficiency Await lab results. - CBC with Differential/Platelet  6. Irregular cardiac rhythm PVCs may account for irregular rhythm on exam, but given LVH, recommend ECHO.  Referral to cardiology. - EKG 12-Lead   Fara Chute, PA-C Physician Assistant-Certified [facility]

## 2015-08-10 NOTE — Telephone Encounter (Signed)
Spoke with patient. Reviewed my discussion with Dr. Everlene Farrier.  Plan: Start Toprol XL 100 mg QD Refer to cardiology for ECHO Await CMET. If renal function is stable, plan to restart metformin, but at 500 mg BID instead of 1000 mg BID.  Recheck in 4 weeks with BMET.

## 2015-08-11 ENCOUNTER — Ambulatory Visit (INDEPENDENT_AMBULATORY_CARE_PROVIDER_SITE_OTHER): Payer: Medicare Other | Admitting: Internal Medicine

## 2015-08-11 ENCOUNTER — Encounter: Payer: Self-pay | Admitting: Internal Medicine

## 2015-08-11 VITALS — BP 158/96 | HR 68 | Ht 70.0 in | Wt 175.6 lb

## 2015-08-11 DIAGNOSIS — R0602 Shortness of breath: Secondary | ICD-10-CM

## 2015-08-11 DIAGNOSIS — E118 Type 2 diabetes mellitus with unspecified complications: Secondary | ICD-10-CM | POA: Diagnosis not present

## 2015-08-11 DIAGNOSIS — I10 Essential (primary) hypertension: Secondary | ICD-10-CM | POA: Diagnosis not present

## 2015-08-11 MED ORDER — METFORMIN HCL 500 MG PO TABS: 500.0000 mg | ORAL_TABLET | Freq: Two times a day (BID) | ORAL | Status: DC

## 2015-08-11 MED ORDER — ATORVASTATIN CALCIUM 20 MG PO TABS: 20.0000 mg | ORAL_TABLET | Freq: Every day | ORAL | Status: DC

## 2015-08-11 NOTE — Progress Notes (Signed)
OFFICE NOTE  Chief Complaint:  Uncontrolled hypertension, dyspnea, abnormal EKG  Primary Care Physician: JEFFERY,CHELLE, PA-C  HPI:  Bryan Knight is a pleasant 69 year old male who is coming referred to me for evaluation of uncontrolled hypertension. He says he's had high blood pressure for over 40 years and has had difficulty controlling it at times. He's been on a number of different medications however recently his blood pressures been difficult to control. In addition he was noted to have some chronic kidney disease with a creatinine of 1.6. Nephrology but cannot get an appointment for 6 months. Currently he is on a regimen of amlodipine 10 mg daily, lisinopril HCTZ 20/12.5 mg and metoprolol succinate 100 mg daily (which was just started by his PCP. He did bring a list of blood pressure readings at home which indicate recent blood pressures in the 170-200/90-100 range. He denies any symptoms with this. Currently his blood pressure is 158/96. He has had some shortness of breath with exertion but no chest pain. He's never had an ischemia workup. He has had 2 renal ultrasounds, last in 2012 which showed normal renal size. These were not renal artery Dopplers and therefore, I cannot comment on whether he could have renal artery stenosis.  PMHx:  Past Medical History  Diagnosis Date  . Hypertension   . Hearing loss   . Depression   . Diabetes mellitus   . Rosacea   . Thyroid disease   . Hyperlipidemia   . Cataract   . Glaucoma     Past Surgical History  Procedure Laterality Date  . Eye surgery    . Tonsillectomy      over 50 yrs ago    FAMHx:  Family History  Problem Relation Age of Onset  . Hypertension Mother   . Cancer Mother 51    liver    SOCHx:   reports that he quit smoking about 47 years ago. His smoking use included Cigarettes. He does not have any smokeless tobacco history on file. He reports that he does not drink alcohol or use illicit drugs.  ALLERGIES:    Allergies  Allergen Reactions  . Citalopram   . Tussionex Pennkinetic Er [Hydrocod Polst-Cpm Polst Er] Other (See Comments)    Foggy headed  . Wellbutrin [Bupropion Hcl] Other (See Comments)    Sluggish     ROS: A comprehensive review of systems was negative except for: Respiratory: positive for dyspnea on exertion  HOME MEDS: Current Outpatient Prescriptions  Medication Sig Dispense Refill  . amLODipine (NORVASC) 10 MG tablet Take 1 tablet (10 mg total) by mouth daily. 90 tablet 1  . aspirin 81 MG tablet Take 81 mg by mouth daily.     . DULoxetine (CYMBALTA) 30 MG capsule Take 1 capsule (30 mg total) by mouth daily. 7 capsule 0  . glucose blood (ONETOUCH VERIO) test strip Test blood sugar daily. Dx code: E11.9 100 each 2  . latanoprost (XALATAN) 0.005 % ophthalmic solution Place 1 drop into both eyes at bedtime.    Marland Kitchen levothyroxine (SYNTHROID, LEVOTHROID) 75 MCG tablet Take 1 tablet (75 mcg total) by mouth daily. 90 tablet 3  . lisinopril-hydrochlorothiazide (PRINZIDE,ZESTORETIC) 20-12.5 MG per tablet Take 1 tablet by mouth daily. 90 tablet 2  . metoprolol succinate (TOPROL XL) 100 MG 24 hr tablet Take 1 tablet (100 mg total) by mouth daily. Take with or immediately following a meal. 90 tablet 3  . montelukast (SINGULAIR) 10 MG tablet TAKE ONE TABLET BY MOUTH  NIGHTLY AT BEDTIME 90 tablet 0  . ONETOUCH DELICA LANCETS FINE MISC USE TO TEST ONCE DAILY AS DIRECTED 100 each 0  . sitaGLIPtin (JANUVIA) 100 MG tablet Take 1 tablet (100 mg total) by mouth daily. 90 tablet 3   No current facility-administered medications for this visit.    LABS/IMAGING: Results for orders placed or performed in visit on 08/10/15 (from the past 48 hour(s))  Comprehensive metabolic panel     Status: Abnormal   Collection Time: 08/10/15 11:34 AM  Result Value Ref Range   Sodium 137 135 - 146 mmol/L   Potassium 3.3 (L) 3.5 - 5.3 mmol/L   Chloride 96 (L) 98 - 110 mmol/L   CO2 29 20 - 31 mmol/L   Glucose,  Bld 232 (H) 65 - 99 mg/dL   BUN 30 (H) 7 - 25 mg/dL   Creat 1.60 (H) 0.70 - 1.25 mg/dL   Total Bilirubin 0.5 0.2 - 1.2 mg/dL   Alkaline Phosphatase 56 40 - 115 U/L   AST 19 10 - 35 U/L   ALT 19 9 - 46 U/L   Total Protein 7.2 6.1 - 8.1 g/dL   Albumin 4.4 3.6 - 5.1 g/dL   Calcium 9.3 8.6 - 10.3 mg/dL  Lipid panel     Status: Abnormal   Collection Time: 08/10/15 11:34 AM  Result Value Ref Range   Cholesterol 252 (H) 125 - 200 mg/dL   Triglycerides 315 (H) <150 mg/dL   HDL 53 >=40 mg/dL   Total CHOL/HDL Ratio 4.8 <=5.0 Ratio   VLDL 63 (H) <30 mg/dL   LDL Cholesterol 136 (H) <130 mg/dL    Comment:   Total Cholesterol/HDL Ratio:CHD Risk                        Coronary Heart Disease Risk Table                                        Men       Women          1/2 Average Risk              3.4        3.3              Average Risk              5.0        4.4           2X Average Risk              9.6        7.1           3X Average Risk             23.4       11.0 Use the calculated Patient Ratio above and the CHD Risk table  to determine the patient's CHD Risk.   TSH     Status: None   Collection Time: 08/10/15 11:34 AM  Result Value Ref Range   TSH 1.948 0.350 - 4.500 uIU/mL  CBC with Differential/Platelet     Status: Abnormal   Collection Time: 08/10/15 11:34 AM  Result Value Ref Range   WBC 9.6 4.0 - 10.5 K/uL   RBC 5.22 4.22 - 5.81 MIL/uL   Hemoglobin 15.2 13.0 - 17.0 g/dL   HCT  43.5 39.0 - 52.0 %   MCV 83.3 78.0 - 100.0 fL   MCH 29.1 26.0 - 34.0 pg   MCHC 34.9 30.0 - 36.0 g/dL   RDW 13.5 11.5 - 15.5 %   Platelets 318 150 - 400 K/uL   MPV 10.0 8.6 - 12.4 fL   Neutrophils Relative % 68 43 - 77 %   Neutro Abs 6.5 1.7 - 7.7 K/uL   Lymphocytes Relative 19 12 - 46 %   Lymphs Abs 1.8 0.7 - 4.0 K/uL   Monocytes Relative 11 3 - 12 %   Monocytes Absolute 1.1 (H) 0.1 - 1.0 K/uL   Eosinophils Relative 2 0 - 5 %   Eosinophils Absolute 0.2 0.0 - 0.7 K/uL   Basophils Relative 0 0 -  1 %   Basophils Absolute 0.0 0.0 - 0.1 K/uL   Smear Review Criteria for review not met   POCT glucose (manual entry)     Status: Abnormal   Collection Time: 08/10/15 11:50 AM  Result Value Ref Range   POC Glucose 219 (A) 70 - 99 mg/dl  POCT glycosylated hemoglobin (Hb A1C)     Status: None   Collection Time: 08/10/15 11:50 AM  Result Value Ref Range   Hemoglobin A1C 7.9    No results found.  WEIGHTS: Wt Readings from Last 3 Encounters:  08/11/15 175 lb 9.6 oz (79.652 kg)  08/10/15 172 lb 12.8 oz (78.382 kg)  05/06/15 166 lb 3.2 oz (75.388 kg)    VITALS: BP 158/96 mmHg  Pulse 68  Ht 5' 10"  (1.778 m)  Wt 175 lb 9.6 oz (79.652 kg)  BMI 25.20 kg/m2  EXAM: General appearance: alert and no distress Neck: no carotid bruit, no JVD and thyroid not enlarged, symmetric, no tenderness/mass/nodules Lungs: clear to auscultation bilaterally Heart: regular rate and rhythm, S1, S2 normal, no murmur, click, rub or gallop Abdomen: soft, non-tender; bowel sounds normal; no masses,  no organomegaly Extremities: extremities normal, atraumatic, no cyanosis or edema Pulses: 2+ and symmetric Skin: Skin color, texture, turgor normal. No rashes or lesions Neurologic: Grossly normal Psych: Pleasant  EKG: Deferred (EKG at PCP yesterday reviewed, shows NSR with LVH and PAC)  ASSESSMENT: 1. Uncontrolled hypertension 2. Dyspnea and exertion 3. Abnormal EKG with voltage criteria for LVH 4. CKD 2  PLAN: 1.   Mr. Brunetti has uncontrolled hypertension which is improved somewhat on metoprolol, although he started on a high dose, he seems to be tolerating it. Blood pressure is now down around 160/96 which is reasonable given the fact his blood pressure may been in the 476 systolic range for quite some time. He does have coronary risk factors including diabetes and uncontrolled hypertension. I like to arrange for a nuclear stress test to look for ischemia. We'll also recommend an echocardiogram as he's had  some shortness of breath, abnormalities on his EKG and long-standing and uncontrolled hypertension. I'll plan to keep his medicines the same for now and he should keep track of his blood pressures. He'll have a scheduled a follow-up appointment to review the results of the studies and if the blood pressure remains elevated we could consider some changes in his medicine at that time.  Thanks for the kind referral.  Pixie Casino, MD, [facility] Attending Cardiologist [place] 08/11/2015, 5:39 PM

## 2015-08-11 NOTE — Patient Instructions (Signed)
Your physician has requested that you have an echocardiogram @ [facility]. Echocardiography is a painless test that uses sound waves to create images of your heart. It provides your doctor with information about the size and shape of your heart and how well your heart's chambers and valves are working. This procedure takes approximately one hour. There are no restrictions for this procedure.  Your physician has requested that you have an exercise stress myoview. For further information please visit [email or web address]. Please follow instruction sheet, as given.  Your physician recommends that you schedule a follow-up appointment with Dr. Debara Pickett after your tests.

## 2015-08-11 NOTE — Addendum Note (Signed)
Addended by: Fara Chute on: 08/11/2015 09:06 PM   Modules accepted: Orders

## 2015-08-12 ENCOUNTER — Encounter: Payer: Self-pay | Admitting: Physician Assistant

## 2015-08-13 ENCOUNTER — Encounter: Payer: Self-pay | Admitting: Physician Assistant

## 2015-08-18 ENCOUNTER — Other Ambulatory Visit: Payer: Self-pay | Admitting: Family Medicine

## 2015-08-19 ENCOUNTER — Encounter: Payer: Self-pay | Admitting: Family Medicine

## 2015-08-19 DIAGNOSIS — E119 Type 2 diabetes mellitus without complications: Secondary | ICD-10-CM | POA: Diagnosis not present

## 2015-08-19 DIAGNOSIS — H35033 Hypertensive retinopathy, bilateral: Secondary | ICD-10-CM | POA: Diagnosis not present

## 2015-08-19 DIAGNOSIS — H401131 Primary open-angle glaucoma, bilateral, mild stage: Secondary | ICD-10-CM | POA: Diagnosis not present

## 2015-08-20 ENCOUNTER — Telehealth (HOSPITAL_COMMUNITY): Payer: Self-pay

## 2015-08-20 NOTE — Telephone Encounter (Signed)
Encounter complete. 

## 2015-08-24 ENCOUNTER — Telehealth (HOSPITAL_COMMUNITY): Payer: Self-pay

## 2015-08-24 NOTE — Telephone Encounter (Signed)
Encounter complete. 

## 2015-08-25 ENCOUNTER — Ambulatory Visit (HOSPITAL_COMMUNITY)
Admission: RE | Admit: 2015-08-25 | Discharge: 2015-08-25 | Disposition: A | Discharge status: Home / Self Care | Payer: Medicare Other | Source: Ambulatory Visit | Attending: Internal Medicine | Admitting: Internal Medicine

## 2015-08-25 DIAGNOSIS — E118 Type 2 diabetes mellitus with unspecified complications: Secondary | ICD-10-CM | POA: Insufficient documentation

## 2015-08-25 DIAGNOSIS — R0602 Shortness of breath: Secondary | ICD-10-CM | POA: Diagnosis not present

## 2015-08-25 DIAGNOSIS — N182 Chronic kidney disease, stage 2 (mild): Secondary | ICD-10-CM | POA: Diagnosis not present

## 2015-08-25 DIAGNOSIS — Z87891 Personal history of nicotine dependence: Secondary | ICD-10-CM | POA: Insufficient documentation

## 2015-08-25 DIAGNOSIS — I129 Hypertensive chronic kidney disease with stage 1 through stage 4 chronic kidney disease, or unspecified chronic kidney disease: Secondary | ICD-10-CM | POA: Diagnosis not present

## 2015-08-25 DIAGNOSIS — I10 Essential (primary) hypertension: Secondary | ICD-10-CM | POA: Diagnosis not present

## 2015-08-25 DIAGNOSIS — R0609 Other forms of dyspnea: Secondary | ICD-10-CM | POA: Insufficient documentation

## 2015-08-25 LAB — MYOCARDIAL PERFUSION IMAGING
CHL CUP NUCLEAR SRS: 2
CHL CUP NUCLEAR SSS: 4
CSEPED: 10 min
CSEPHR: 86 %
Estimated workload: 10.1 METS
Exercise duration (sec): 0 s
LV dias vol: 108 mL
LV sys vol: 47 mL
MPHR: 151 {beats}/min
Peak HR: 131 {beats}/min
RPE: 17
Rest HR: 58 {beats}/min
SDS: 2
TID: 0.88

## 2015-08-25 MED ORDER — TECHNETIUM TC 99M SESTAMIBI GENERIC - CARDIOLITE
10.5000 | Freq: Once | INTRAVENOUS | Status: AC | PRN
  Administered 2015-08-25: 10.5 via INTRAVENOUS

## 2015-08-25 MED ORDER — TECHNETIUM TC 99M SESTAMIBI GENERIC - CARDIOLITE
31.2000 | Freq: Once | INTRAVENOUS | Status: AC | PRN
  Administered 2015-08-25: 31.2 via INTRAVENOUS

## 2015-08-28 ENCOUNTER — Other Ambulatory Visit: Payer: Self-pay | Admitting: Family Medicine

## 2015-08-31 ENCOUNTER — Ambulatory Visit (HOSPITAL_COMMUNITY): Payer: Medicare Other | Attending: Cardiology

## 2015-08-31 ENCOUNTER — Other Ambulatory Visit: Payer: Self-pay

## 2015-08-31 DIAGNOSIS — E785 Hyperlipidemia, unspecified: Secondary | ICD-10-CM | POA: Insufficient documentation

## 2015-08-31 DIAGNOSIS — I517 Cardiomegaly: Secondary | ICD-10-CM | POA: Insufficient documentation

## 2015-08-31 DIAGNOSIS — I10 Essential (primary) hypertension: Secondary | ICD-10-CM | POA: Insufficient documentation

## 2015-08-31 DIAGNOSIS — I340 Nonrheumatic mitral (valve) insufficiency: Secondary | ICD-10-CM | POA: Diagnosis not present

## 2015-08-31 DIAGNOSIS — I5189 Other ill-defined heart diseases: Secondary | ICD-10-CM | POA: Insufficient documentation

## 2015-08-31 DIAGNOSIS — R0602 Shortness of breath: Secondary | ICD-10-CM | POA: Diagnosis not present

## 2015-08-31 DIAGNOSIS — R0600 Dyspnea, unspecified: Secondary | ICD-10-CM | POA: Diagnosis present

## 2015-08-31 DIAGNOSIS — E119 Type 2 diabetes mellitus without complications: Secondary | ICD-10-CM | POA: Insufficient documentation

## 2015-08-31 DIAGNOSIS — I272 Other secondary pulmonary hypertension: Secondary | ICD-10-CM | POA: Diagnosis not present

## 2015-09-06 ENCOUNTER — Telehealth: Payer: Self-pay | Admitting: *Deleted

## 2015-09-06 MED ORDER — CHLORTHALIDONE 25 MG PO TABS: 12.5000 mg | ORAL_TABLET | Freq: Every day | ORAL | Status: DC

## 2015-09-06 NOTE — Telephone Encounter (Signed)
-----   Message from Pixie Casino, MD sent at 09/02/2015  7:20 PM EST ----- Echo shows moderate diastolic dysfunction and elevated pulmonary pressures - likely explains shortness of breath. Would probably benefit from addition of chlorthalidone 12.5 mg daily. Please Rx for this. Try to schedule a follow-up with Erasmo Downer in early January and I'm scheduled to see him at the end of January.  Dr. Lemmie Evens

## 2015-09-06 NOTE — Telephone Encounter (Signed)
Spoke with pt, Aware of dr hilty's recommendations.  New script sent to the pharmacy and Follow up scheduled with pharm md

## 2015-09-14 ENCOUNTER — Encounter: Payer: Self-pay | Admitting: Physician Assistant

## 2015-09-14 ENCOUNTER — Ambulatory Visit (INDEPENDENT_AMBULATORY_CARE_PROVIDER_SITE_OTHER): Payer: Medicare Other | Admitting: Physician Assistant

## 2015-09-14 VITALS — BP 194/87 | HR 60 | Temp 98.0°F | Resp 16 | Ht 70.0 in | Wt 170.0 lb

## 2015-09-14 DIAGNOSIS — I5189 Other ill-defined heart diseases: Secondary | ICD-10-CM

## 2015-09-14 DIAGNOSIS — I519 Heart disease, unspecified: Secondary | ICD-10-CM | POA: Diagnosis not present

## 2015-09-14 DIAGNOSIS — E1165 Type 2 diabetes mellitus with hyperglycemia: Secondary | ICD-10-CM

## 2015-09-14 DIAGNOSIS — I10 Essential (primary) hypertension: Secondary | ICD-10-CM | POA: Diagnosis not present

## 2015-09-14 DIAGNOSIS — E119 Type 2 diabetes mellitus without complications: Secondary | ICD-10-CM | POA: Diagnosis not present

## 2015-09-14 DIAGNOSIS — IMO0001 Reserved for inherently not codable concepts without codable children: Secondary | ICD-10-CM

## 2015-09-14 LAB — BASIC METABOLIC PANEL
BUN: 32 mg/dL — ABNORMAL HIGH (ref 7–25)
CALCIUM: 10.4 mg/dL — AB (ref 8.6–10.3)
CO2: 30 mmol/L (ref 20–31)
CREATININE: 1.76 mg/dL — AB (ref 0.70–1.25)
Chloride: 97 mmol/L — ABNORMAL LOW (ref 98–110)
Glucose, Bld: 164 mg/dL — ABNORMAL HIGH (ref 65–99)
Potassium: 3.4 mmol/L — ABNORMAL LOW (ref 3.5–5.3)
Sodium: 139 mmol/L (ref 135–146)

## 2015-09-14 NOTE — Progress Notes (Signed)
Patient ID: Bryan Knight, male    DOB: [birth year], 69 y.o.   MRN: [id number]  PCP: Takeshi Teasdale, PA-C  Subjective:   Chief Complaint  Patient presents with  . Follow-up  . Hypertension  . Diabetes    HPI Presents for evaluation of renal function after re-initiation of metformin at 500 mg BID.  Since his last visit he has seen Dr. Debara Pickett. ECHO revealed diastolic dysfunction and elevated pulmonary pressures. Chlorthalidone was added. He will follow-up with the clinical practicing pharmacist in early January, and then with Dr. Debara Pickett later in the month.  He is tolerating the addition of chlorthalidone without difficulty.    Review of Systems No CP, SOB, HA, dizziness. No muscle aches. He was having RIGHT hand pain that prevented making a fist. That has resolved, but he now has pain in the LEFT knee. He is not interested in evaluation of that-thought it might be medication related.    Patient Active Problem List   Diagnosis Date Noted  . Diastolic dysfunction 123456  . Microalbuminuria 04/02/2015  . Depression 04/02/2015  . Foreign body in esophagus 10/24/2013  . B12 deficiency 01/11/2012  . Hypertension 10/20/2011  . Diabetes mellitus type II, controlled ([place]) 10/20/2011  . Hypothyroid 10/20/2011  . Rosacea 10/20/2011     Prior to Admission medications   Medication Sig Start Date End Date Taking? Authorizing Provider  amLODipine (NORVASC) 10 MG tablet TAKE 1 TABLET (10 MG TOTAL) BY MOUTH DAILY 08/19/15  Yes [name], MD  aspirin 81 MG tablet Take 81 mg by mouth daily.    Yes Historical Provider, MD  atorvastatin (LIPITOR) 20 MG tablet Take 1 tablet (20 mg total) by mouth daily. 08/11/15  Yes Avri Paiva, PA-C  chlorthalidone (HYGROTON) 25 MG tablet Take 0.5 tablets (12.5 mg total) by mouth daily. 09/06/15  Yes Pixie Casino, MD  DULoxetine (CYMBALTA) 30 MG capsule Take 1 capsule (30 mg total) by mouth daily. 04/02/15  Yes Sigourney Portillo, PA-C  glucose  blood (ONETOUCH VERIO) test strip Test blood sugar once daily. Dx: E11.65 08/30/15  Yes Doratha Mcswain, PA-C  latanoprost (XALATAN) 0.005 % ophthalmic solution Place 1 drop into both eyes at bedtime.   Yes Historical Provider, MD  levothyroxine (SYNTHROID, LEVOTHROID) 75 MCG tablet Take 1 tablet (75 mcg total) by mouth daily. 05/06/15  Yes Jaykwon Morones, PA-C  lisinopril-hydrochlorothiazide (PRINZIDE,ZESTORETIC) 20-12.5 MG per tablet Take 1 tablet by mouth daily. 02/11/15  Yes [name], MD  metFORMIN (GLUCOPHAGE) 500 MG tablet Take 1 tablet (500 mg total) by mouth 2 (two) times daily with a meal. 08/11/15  Yes Runell Kovich, PA-C  metoprolol succinate (TOPROL XL) 100 MG 24 hr tablet Take 1 tablet (100 mg total) by mouth daily. Take with or immediately following a meal. 08/10/15  Yes Pritesh Sobecki, PA-C  ONETOUCH DELICA LANCETS FINE MISC USE TO TEST ONCE DAILY AS DIRECTED 05/21/15  Yes Kayzlee Wirtanen, PA-C  sitaGLIPtin (JANUVIA) 100 MG tablet Take 1 tablet (100 mg total) by mouth daily. 04/06/15  Yes Senora Lacson, PA-C  montelukast (SINGULAIR) 10 MG tablet Take 10 mg by mouth at bedtime. 06/12/15   Historical Provider, MD     Allergies  Allergen Reactions  . Citalopram   . Tussionex Pennkinetic Er [Hydrocod Polst-Cpm Polst Er] Other (See Comments)    Foggy headed  . Wellbutrin [Bupropion Hcl] Other (See Comments)    Sluggish        Objective:  Physical Exam  Constitutional: He is oriented to person,  place, and time. Vital signs are normal. He appears well-developed and well-nourished. He is active and cooperative. No distress.  BP 171/77 mmHg  Pulse 64  Temp(Src) 98 F (36.7 C)  Resp 16  Ht 5\' 10"  (1.778 m)  Wt 170 lb (77.111 kg)  BMI 24.39 kg/m2 Repeated BP was higher  HENT:  Head: Normocephalic and atraumatic.  Right Ear: Hearing normal.  Left Ear: Hearing normal.  Eyes: Conjunctivae are normal. No scleral icterus.  Neck: Normal range of motion. Neck supple. No  thyromegaly present.  Cardiovascular: Normal rate, regular rhythm and normal heart sounds.   Pulses:      Radial pulses are 2+ on the right side, and 2+ on the left side.  Pulmonary/Chest: Effort normal and breath sounds normal.  Lymphadenopathy:       Head (right side): No tonsillar, no preauricular, no posterior auricular and no occipital adenopathy present.       Head (left side): No tonsillar, no preauricular, no posterior auricular and no occipital adenopathy present.    He has no cervical adenopathy.       Right: No supraclavicular adenopathy present.       Left: No supraclavicular adenopathy present.  Neurological: He is alert and oriented to person, place, and time. No sensory deficit.  Skin: Skin is warm, dry and intact. No rash noted. No cyanosis or erythema. Nails show no clubbing.  Psychiatric: He has a normal mood and affect. His speech is normal and behavior is normal.           Assessment & Plan:   1. Diastolic dysfunction 2. Essential hypertension Follow-up with cardiology as advised.   3. Uncontrolled type 2 diabetes mellitus without complication, without long-term current use of insulin ([place]) Await BMET. If renal function remains stable, will increase metformin dose to 100 mg BID and continue to monitor. - Basic metabolic panel   Fara Chute, PA-C Physician Assistant-Certified [facility]

## 2015-09-14 NOTE — Patient Instructions (Signed)
I will contact you with your lab results as soon as they are available.   If you have not heard from me in 2 weeks, please contact me.  The fastest way to get your results is to register for My Chart (see the instructions on the last page of this printout).   

## 2015-09-15 NOTE — Progress Notes (Signed)
I don't think we communicated that to him. He should stop the HCTZ while on chlorthalidone. The plan will be to uptitrate that as tolerated. If you want to Rx for lisinopril without HCTZ that would be great. Thanks. I think he has a follow-up with either myself of Erasmo Downer (our pharmacist who manages HTN) in January.  Dr. Lemmie Evens

## 2015-09-16 ENCOUNTER — Other Ambulatory Visit: Payer: Self-pay | Admitting: Physician Assistant

## 2015-09-16 MED ORDER — LISINOPRIL 20 MG PO TABS: 20.0000 mg | ORAL_TABLET | Freq: Every day | ORAL | Status: DC

## 2015-09-16 NOTE — Addendum Note (Signed)
Addended by: Fara Chute on: 09/16/2015 07:57 AM   Modules accepted: Orders, Medications

## 2015-09-17 ENCOUNTER — Encounter: Payer: Self-pay | Admitting: Physician Assistant

## 2015-09-27 DIAGNOSIS — E1122 Type 2 diabetes mellitus with diabetic chronic kidney disease: Secondary | ICD-10-CM | POA: Diagnosis not present

## 2015-09-27 DIAGNOSIS — N183 Chronic kidney disease, stage 3 (moderate): Secondary | ICD-10-CM | POA: Diagnosis not present

## 2015-09-27 DIAGNOSIS — I129 Hypertensive chronic kidney disease with stage 1 through stage 4 chronic kidney disease, or unspecified chronic kidney disease: Secondary | ICD-10-CM | POA: Diagnosis not present

## 2015-09-27 DIAGNOSIS — R809 Proteinuria, unspecified: Secondary | ICD-10-CM | POA: Diagnosis not present

## 2015-09-28 ENCOUNTER — Ambulatory Visit (INDEPENDENT_AMBULATORY_CARE_PROVIDER_SITE_OTHER): Payer: Medicare Other | Admitting: Pharmacist Clinician (PhC)/ Clinical Pharmacy Specialist

## 2015-09-28 ENCOUNTER — Other Ambulatory Visit: Payer: Self-pay | Admitting: Nephrology

## 2015-09-28 ENCOUNTER — Encounter: Payer: Self-pay | Admitting: Pharmacist Clinician (PhC)/ Clinical Pharmacy Specialist

## 2015-09-28 VITALS — BP 180/92 | HR 72 | Ht 70.0 in | Wt 175.3 lb

## 2015-09-28 DIAGNOSIS — I10 Essential (primary) hypertension: Secondary | ICD-10-CM

## 2015-09-28 DIAGNOSIS — N183 Chronic kidney disease, stage 3 unspecified: Secondary | ICD-10-CM

## 2015-09-28 NOTE — Patient Instructions (Signed)
Return for a a follow up appointment in 1 month  Your blood pressure today is 180/92  (goal is < 150/90)  Check your blood pressure at home daily and keep record of the readings.  Take your BP meds as follows:  Increase chlorthalidone to 1 tablet each morning (25 mg)  AM: lisinopril and chlorthalidone  PM: amlodipine and metoprolol  Bring all of your meds, your BP cuff and your record of home blood pressures to your next appointment.  Exercise as you're able, try to walk approximately 30 minutes per day.  Keep salt intake to a minimum, especially watch canned and prepared boxed foods.  Eat more fresh fruits and vegetables and fewer canned items.  Avoid eating in fast food restaurants.    HOW TO TAKE YOUR BLOOD PRESSURE: . Rest 5 minutes before taking your blood pressure. .  Don't smoke or drink caffeinated beverages for at least 30 minutes before. . Take your blood pressure before (not after) you eat. . Sit comfortably with your back supported and both feet on the floor (don't cross your legs). . Elevate your arm to heart level on a table or a desk. . Use the proper sized cuff. It should fit smoothly and snugly around your bare upper arm. There should be enough room to slip a fingertip under the cuff. The bottom edge of the cuff should be 1 inch above the crease of the elbow. . Ideally, take 3 measurements at one sitting and record the average.

## 2015-09-28 NOTE — Progress Notes (Signed)
09/28/2015 Bryan Knight 1946/09/01 [id number]   HPI:  Bryan Knight is a 70 y.o. male patient of Dr Debara Pickett with a PMH below who presents today for hypertension clinic evaluation.   He states his BP history goes back more than 40 years with problems regulating it for much of that time.  He cannot recall all of the different medications he's tried in the past other than clonidine patche.  He states compliance with his medications and does check home BP readings regularly.  He saw Dr. Mercy Moore at [facility] yesterday and was advised to not change up any of his medications without contacting them first.  His most recent BMET from Dec 27 shows a Scr of 1.76 with a CrCl calculated at 44.6.  He believes Dr. Mercy Moore repeated the blood work yesterday.  Cardiac Hx:DM, hyperlipidemia  Family Hx: mother had hx of hypertension, lived to 8, one son recently diagnosed at [facility] [address]  Social Hx: quit smoking in 1969 (day he was discharged from the [facility]), no alcohol; drinks 3 or more 16 oz diet pepsi per day  Diet: eats most meals at home, wide variety of meats, vegetables, no specific limits or restrictions; does not add salt  Exercise: walks 1 + miles per day as weather permits  Home BP readings: mostly [id number] systolic, 123456 diastolic; home cuff is 80+ years old  Current antihypertensive medications: chlorthalidone 12.5 mg, amlodipine 10 mg , lisinopril 20 mg, metoprolol succ 100 mg  Intolerances:  Wt Readings from Last 3 Encounters:  09/28/15 175 lb 4.8 oz (79.516 kg)  09/14/15 170 lb (77.111 kg)  08/25/15 175 lb (79.379 kg)   BP Readings from Last 3 Encounters:  09/28/15 180/92  09/14/15 194/87  08/11/15 158/96   Pulse Readings from Last 3 Encounters:  09/28/15 72  09/14/15 60  08/11/15 68    Current Outpatient Prescriptions  Medication Sig Dispense Refill  . amLODipine (NORVASC) 10 MG tablet TAKE 1 TABLET (10 MG TOTAL) BY MOUTH DAILY 90 tablet 0  . aspirin 81 MG tablet  Take 81 mg by mouth daily.     Marland Kitchen atorvastatin (LIPITOR) 20 MG tablet Take 1 tablet (20 mg total) by mouth daily. 90 tablet 3  . chlorthalidone (HYGROTON) 25 MG tablet Take 0.5 tablets (12.5 mg total) by mouth daily. 15 tablet 6  . DULoxetine (CYMBALTA) 30 MG capsule Take 1 capsule (30 mg total) by mouth daily. 7 capsule 0  . glucose blood (ONETOUCH VERIO) test strip Test blood sugar once daily. Dx: E11.65 100 each 3  . latanoprost (XALATAN) 0.005 % ophthalmic solution Place 1 drop into both eyes at bedtime.    Marland Kitchen levothyroxine (SYNTHROID, LEVOTHROID) 75 MCG tablet Take 1 tablet (75 mcg total) by mouth daily. 90 tablet 3  . lisinopril (PRINIVIL,ZESTRIL) 20 MG tablet Take 1 tablet (20 mg total) by mouth daily. 90 tablet 3  . metFORMIN (GLUCOPHAGE) 500 MG tablet Take 1 tablet (500 mg total) by mouth 2 (two) times daily with a meal. 180 tablet 3  . metoprolol succinate (TOPROL XL) 100 MG 24 hr tablet Take 1 tablet (100 mg total) by mouth daily. Take with or immediately following a meal. 90 tablet 3  . montelukast (SINGULAIR) 10 MG tablet Take 10 mg by mouth at bedtime.  3  . ONETOUCH DELICA LANCETS 99991111 MISC USE TO TEST ONCE DAILY AS DIRECTED**MAX DAYS SUPPLY 90** 100 each 0  . sitaGLIPtin (JANUVIA) 100 MG tablet Take 1 tablet (100 mg  total) by mouth daily. 90 tablet 3   No current facility-administered medications for this visit.    Allergies  Allergen Reactions  . Citalopram   . Tussionex Pennkinetic Er [Hydrocod Polst-Cpm Polst Er] Other (See Comments)    Foggy headed  . Wellbutrin [Bupropion Hcl] Other (See Comments)    Sluggish     Past Medical History  Diagnosis Date  . Hypertension   . Hearing loss   . Depression   . Diabetes mellitus   . Rosacea   . Thyroid disease   . Hyperlipidemia   . Cataract   . Glaucoma     Blood pressure 180/92, pulse 72, height 5\' 10"  (1.778 m), weight 175 lb 4.8 oz (79.516 kg).    Tommy Medal PharmD CPP [facility]

## 2015-09-28 NOTE — Assessment & Plan Note (Addendum)
BP remains elevated at 180/92 today.  I would like to increase his chlorthalidone to 25 mg daily, as well as move the amlodipine and metoprolol to evenings, leaving the lisinopril in the mornings.  I will contact Dr. Mercy Moore for his most current SCr.  Once I get that information we will make the dosage adjustment.  I will also review with Dr. Mercy Moore as to if he would prefer to follow with the patient or have [place] continue to monitor.   Received phone message from [place] at Dr. Etheleen Nicks office.  They would prefer to increase the lisinopril to 20 mg bid and add clonidine 0.1 mg up to q6h prn BP [id number] systolic.  This was reviewed with patient by phone.  Advised that he check his BP twice daily for several days, to get a feel for how much clonidine he may need.  Reviewed side effects.

## 2015-09-29 ENCOUNTER — Telehealth: Payer: Self-pay | Admitting: Internal Medicine

## 2015-09-29 MED ORDER — LISINOPRIL 20 MG PO TABS: 20.0000 mg | ORAL_TABLET | Freq: Two times a day (BID) | ORAL | Status: DC

## 2015-09-29 MED ORDER — CLONIDINE HCL 0.1 MG PO TABS: ORAL_TABLET | ORAL | Status: DC

## 2015-09-29 NOTE — Telephone Encounter (Signed)
Ron is calling because there is a medication drug inter action between Clonidine and Metoprolol .Wanted to make sure that you are aware of the interaction. Please call if need too .   Thanks

## 2015-09-29 NOTE — Telephone Encounter (Signed)
I'm aware of this .Marland Kitchen It's okay to take both.  Dr. Lemmie Evens

## 2015-09-29 NOTE — Telephone Encounter (Signed)
Returned call to Ron with [facility] Dr.Hilty aware.Advised to continue both.

## 2015-09-29 NOTE — Telephone Encounter (Signed)
Returned call to Ron with [facility] in [employer].He stated he wanted to let Dr.Hilty know there is a interaction with Clonidine and Metoprolol.Message sent to Dr.Hilty.

## 2015-10-01 ENCOUNTER — Ambulatory Visit
Admission: RE | Admit: 2015-10-01 | Discharge: 2015-10-01 | Disposition: A | Discharge status: Home / Self Care | Payer: Medicare Other | Source: Ambulatory Visit | Attending: Nephrology | Admitting: Nephrology

## 2015-10-01 DIAGNOSIS — N183 Chronic kidney disease, stage 3 unspecified: Secondary | ICD-10-CM

## 2015-10-06 ENCOUNTER — Encounter: Payer: Self-pay | Admitting: Podiatry

## 2015-10-06 ENCOUNTER — Ambulatory Visit: Payer: Medicare Other | Admitting: Podiatry

## 2015-10-06 ENCOUNTER — Ambulatory Visit (INDEPENDENT_AMBULATORY_CARE_PROVIDER_SITE_OTHER): Payer: Medicare Other | Admitting: Podiatry

## 2015-10-06 DIAGNOSIS — B351 Tinea unguium: Secondary | ICD-10-CM | POA: Diagnosis not present

## 2015-10-06 DIAGNOSIS — M79673 Pain in unspecified foot: Secondary | ICD-10-CM | POA: Diagnosis not present

## 2015-10-06 NOTE — Progress Notes (Signed)
Patient ID: Bryan Knight, male   DOB: 1946-01-31, 70 y.o.   MRN: [id number]  Complaint:  Visit Type: Patient returns to my office for continued preventative foot care services. Complaint: Patient states" my nails have grown long and thick and become painful to walk and wear shoes" Patient has been diagnosed with DM with no complications. He presents for preventative foot care services. No changes to ROS  Podiatric Exam: Vascular: dorsalis pedis and posterior tibial pulses are palpable bilateral. Capillary return is immediate. Temperature gradient is WNL. Skin turgor WNL  Sensorium: Normal Semmes Weinstein monofilament test. Normal tactile sensation bilaterally. Nail Exam: Pt has thick disfigured discolored nails with subungual debris noted bilateral entire nail hallux through fifth toenails Ulcer Exam: There is no evidence of ulcer or pre-ulcerative changes or infection. Orthopedic Exam: Muscle tone and strength are WNL. No limitations in general ROM. No crepitus or effusions noted. Foot type and digits show no abnormalities. Bony prominences are unremarkable. Skin: No Porokeratosis. No infection or ulcers  Diagnosis:  Tinea unguium, Pain in right toe, pain in left toes  Treatment & Plan Procedures and Treatment: Consent by patient was obtained for treatment procedures. The patient understood the discussion of treatment and procedures well. All questions were answered thoroughly reviewed. Debridement of mycotic and hypertrophic toenails, 1 through 5 bilateral and clearing of subungual debris. No ulceration, no infection noted.  Return Visit-Office Procedure: Patient instructed to return to the office for a follow up visit 3 months for continued evaluation and treatment.

## 2015-10-08 DIAGNOSIS — N183 Chronic kidney disease, stage 3 (moderate): Secondary | ICD-10-CM | POA: Diagnosis not present

## 2015-10-15 ENCOUNTER — Ambulatory Visit: Payer: Medicare Other | Admitting: Internal Medicine

## 2015-10-19 ENCOUNTER — Ambulatory Visit (INDEPENDENT_AMBULATORY_CARE_PROVIDER_SITE_OTHER): Payer: Medicare Other | Admitting: Physician Assistant

## 2015-10-19 ENCOUNTER — Ambulatory Visit (INDEPENDENT_AMBULATORY_CARE_PROVIDER_SITE_OTHER): Payer: Medicare Other | Admitting: Internal Medicine

## 2015-10-19 ENCOUNTER — Encounter: Payer: Self-pay | Admitting: Internal Medicine

## 2015-10-19 ENCOUNTER — Encounter: Payer: Self-pay | Admitting: Physician Assistant

## 2015-10-19 VITALS — BP 172/102 | HR 72 | Ht 70.0 in | Wt 172.6 lb

## 2015-10-19 VITALS — BP 163/74 | HR 66 | Temp 97.4°F | Resp 16 | Ht 68.5 in | Wt 169.2 lb

## 2015-10-19 DIAGNOSIS — I519 Heart disease, unspecified: Secondary | ICD-10-CM | POA: Diagnosis not present

## 2015-10-19 DIAGNOSIS — M25562 Pain in left knee: Secondary | ICD-10-CM | POA: Diagnosis not present

## 2015-10-19 DIAGNOSIS — I5189 Other ill-defined heart diseases: Secondary | ICD-10-CM

## 2015-10-19 DIAGNOSIS — I10 Essential (primary) hypertension: Secondary | ICD-10-CM

## 2015-10-19 MED ORDER — CLONIDINE HCL 0.1 MG PO TABS: 0.1000 mg | ORAL_TABLET | Freq: Every evening | ORAL | Status: DC

## 2015-10-19 NOTE — Progress Notes (Signed)
Subjective:    Patient ID: Bryan Knight, male    DOB: 07/06/1946, 70 y.o.   MRN: [id number]  Chief Complaint  Patient presents with  . Knee Pain    LEFT x 1 month    HPI Patient presents today with left knee pain x 2 week. Left knee pain noted during last visit, 09/14/15, evaluation deferred. No acute trauma noted. Pain is an on/off pain located on the medial portion of the left knee, lasting 15-20 minutes. No notable factors associated before onset. No aggravating factors after pain onset. No relief with 2 Tylenol prn. Rates as 6-7/10. Denies erythema, swelling, ecchymosis, bony deformities, decrease in ROM, morning stiffness, stiffness after periods of rest, relief with walking, tenderness to palpation, antalgic gait, numbness, tingling or weakness.    Review of Systems  Constitutional: Negative for fever, chills and diaphoresis.  Respiratory: Negative for cough, chest tightness and shortness of breath.   Cardiovascular: Negative for chest pain, palpitations and leg swelling.       No claudication  Genitourinary: Negative for urgency, frequency and difficulty urinating.  Musculoskeletal: Positive for arthralgias (left knee). Negative for myalgias, joint swelling and gait problem.       Denies bony deformities, decreased ROM, morning stiffness, stiffness after periods of rest, relief with walking, tenderness to palpation, antalgic gait.   Skin: Negative for color change, pallor, rash and wound.       No erythema, ecchymosis, or swelling  Neurological: Negative for weakness and numbness.   Allergies  Allergen Reactions  . Citalopram   . Tussionex Pennkinetic Er [Hydrocod Polst-Cpm Polst Er] Other (See Comments)    Foggy headed  . Wellbutrin [Bupropion Hcl] Other (See Comments)    Sluggish     Prior to Admission medications   Medication Sig Start Date End Date Taking? Authorizing Provider  amLODipine (NORVASC) 10 MG tablet TAKE 1 TABLET (10 MG TOTAL) BY MOUTH DAILY 08/19/15  Yes  [name], MD  aspirin 81 MG tablet Take 81 mg by mouth daily.    Yes Historical Provider, MD  atorvastatin (LIPITOR) 20 MG tablet Take 1 tablet (20 mg total) by mouth daily. 08/11/15  Yes Chelle Jeffery, PA-C  chlorthalidone (HYGROTON) 25 MG tablet Take 0.5 tablets (12.5 mg total) by mouth daily. 09/06/15  Yes Pixie Casino, MD  DULoxetine (CYMBALTA) 30 MG capsule Take 1 capsule (30 mg total) by mouth daily. 04/02/15  Yes Chelle Jeffery, PA-C  latanoprost (XALATAN) 0.005 % ophthalmic solution Place 1 drop into both eyes at bedtime.   Yes Historical Provider, MD  levothyroxine (SYNTHROID, LEVOTHROID) 75 MCG tablet Take 1 tablet (75 mcg total) by mouth daily. 05/06/15  Yes Chelle Jeffery, PA-C  metFORMIN (GLUCOPHAGE) 500 MG tablet Take 1 tablet (500 mg total) by mouth 2 (two) times daily with a meal. 08/11/15  Yes Chelle Jeffery, PA-C  metoprolol succinate (TOPROL XL) 100 MG 24 hr tablet Take 1 tablet (100 mg total) by mouth daily. Take with or immediately following a meal. 08/10/15  Yes Chelle Jeffery, PA-C  montelukast (SINGULAIR) 10 MG tablet Take 10 mg by mouth at bedtime. 06/12/15  Yes Historical Provider, MD  sitaGLIPtin (JANUVIA) 100 MG tablet Take 1 tablet (100 mg total) by mouth daily. 04/06/15  Yes Chelle Jeffery, PA-C  cloNIDine (CATAPRES) 0.1 MG tablet Take 1 tablet (0.1 mg total) by mouth every evening. 10/19/15   Pixie Casino, MD  glucose blood (ONETOUCH VERIO) test strip Test blood sugar once daily. Dx: E11.65 08/30/15  Chelle Jeffery, PA-C  lisinopril (PRINIVIL,ZESTRIL) 20 MG tablet Take 1 tablet (20 mg total) by mouth 2 (two) times daily. 09/29/15   Pixie Casino, MD  lisinopril (PRINIVIL,ZESTRIL) 40 MG tablet Take 40 mg by mouth daily. 09/30/15   Historical Provider, MD  Jonetta Speak LANCETS 99991111 MISC USE TO TEST ONCE DAILY AS DIRECTED**MAX DAYS SUPPLY 90** 09/16/15   Harrison Mons, PA-C   Patient Active Problem List   Diagnosis Date Noted  . Knee pain, left 10/19/2015    . Diastolic dysfunction 123456  . Microalbuminuria 04/02/2015  . Depression 04/02/2015  . Foreign body in esophagus 10/24/2013  . B12 deficiency 01/11/2012  . Hypertension 10/20/2011  . Diabetes mellitus type II, controlled ([place]) 10/20/2011  . Hypothyroid 10/20/2011  . Rosacea 10/20/2011      Objective:   Physical Exam  Constitutional: He appears well-developed and well-nourished. No distress.  BP 163/74 mmHg  Pulse 66  Temp(Src) 97.4 F (36.3 C) (Oral)  Resp 16  Ht 5' 8.5" (1.74 m)  Wt 169 lb 3.2 oz (76.749 kg)  BMI 25.35 kg/m2  SpO2 98%   Musculoskeletal:       Right hip: Normal.       Left hip: Normal.       Right knee: Normal. He exhibits normal range of motion, no swelling, no effusion, no ecchymosis, no deformity, no laceration, no erythema, normal alignment, no LCL laxity, normal patellar mobility and no MCL laxity. No tenderness found.       Left knee: He exhibits abnormal meniscus (medial). He exhibits normal range of motion, no swelling, no effusion (negative bulge and ballot), no ecchymosis, no deformity, no laceration, no erythema, normal alignment, no LCL laxity, normal patellar mobility, no bony tenderness and no MCL laxity. No tenderness found. No medial joint line, no lateral joint line, no MCL, no LCL and no patellar tendon tenderness noted.       Right ankle: Normal. Achilles tendon normal.       Left ankle: Normal. Achilles tendon normal.       Right upper leg: Normal.       Left upper leg: Normal.       Right lower leg: Normal.       Left lower leg: Normal.       Right foot: Normal.       Left foot: Normal.  McMurray test positive for medial meniscus on left leg, illicited with pain.  Anterior drawer test negative b/l Posterior drawer test negative b/l.  Evidence of bony deposits on head of tibia b/l, present since childhood.   Neurological: He is alert. He has normal reflexes. Gait normal.  Reflex Scores:      Patellar reflexes are 2+ on the right  side and 2+ on the left side.      Achilles reflexes are 2+ on the right side and 2+ on the left side. No antalgic gait.   Skin: Skin is warm and dry. No bruising and no ecchymosis noted. No cyanosis.  Cap refill <3 seconds  Psychiatric: He has a normal mood and affect. His speech is normal.  Vitals reviewed.      Assessment & Plan:  1. Knee pain, left -Left medial meniscus tear as evidenced by physical exam. Spoke with patient about options, including pain medication, referral to a orthopedist, or to do nothing.   No Follow-up on file.

## 2015-10-19 NOTE — Patient Instructions (Signed)
You may take acetaminophen (Tylenol) if needed. Let me know if you'd like to see an orthopedic specialist.  Meniscus Tear A meniscus tear is a knee injury in which a piece of the meniscus is torn. The meniscus is a thick, rubbery, wedge-shaped cartilage in the knee. Two menisci are located in each knee. They sit between the upper bone (femur) and lower bone (tibia) that make up the knee joint. Each meniscus acts as a shock absorber for the knee. A torn meniscus is one of the most common types of knee injuries. This injury can range from mild to severe. Surgery may be needed for a severe tear. CAUSES This injury may be caused by any squatting, twisting, or pivoting movement. Sports-related injuries are the most common cause. These often occur from:  Running and stopping suddenly.  Changing direction.  Being tackled or knocked off your feet. As people get older, their meniscus gets thinner and weaker. In these people, tears can happen more easily, such as from climbing stairs.  RISK FACTORS This injury is more likely to happen to:  People who play contact sports.  Males.  People who are 24-39 years of age. SYMPTOMS  Symptoms of this injury include:  Knee pain, especially at the side of the knee joint. You may feel pain when the injury occurs, or you may only hear a pop and feel pain later.  A feeling that your knee is clicking, catching, locking, or giving way.  Not being able to fully bend or extend your knee.  Bruising or swelling in your knee. DIAGNOSIS  This injury may be diagnosed based on your symptoms and a physical exam. The physical exam may include:  Moving your knee in different ways.  Feeling for tenderness.  Listening for a clicking sound.  Checking if your knee locks or catches. You may also have tests, such as:  X-rays.  MRI.  A procedure to look inside your knee with a narrow surgical telescope (arthroscopy). You may be referred to a knee specialist  (orthopedic surgeon). TREATMENT  Treatment for this injury depends on the severity of the tear. Treatment for a mild tear may include:  Rest.  Medicine to reduce pain and swelling. This is usually a nonsteroidal anti-inflammatory drug (NSAID).  A knee brace or an elastic sleeve or wrap.  Using crutches or a walker to keep weight off your knee and to help you walk.  Exercises to strengthen your knee (physical therapy). You may need surgery if you have a severe tear or if other treatments are not working.  HOME CARE INSTRUCTIONS Managing Pain and Swelling  Take over-the-counter and prescription medicines only as told by your health care provider.  If directed, apply ice to the injured area:  Put ice in a plastic bag.  Place a towel between your skin and the bag.  Leave the ice on for 20 minutes, 2-3 times per day.  Raise (elevate) the injured area above the level of your heart while you are sitting or lying down. Activity  Do not use the injured limb to support your body weight until your health care provider says that you can. Use crutches or a walker as told by your health care provider.  Return to your normal activities as told by your health care provider. Ask your health care provider what activities are safe for you.  Perform range-of-motion exercises only as told by your health care provider.  Begin doing exercises to strengthen your knee and leg muscles only  as told by your health care provider. After you recover, your health care provider may recommend these exercises to help prevent another injury. General Instructions  Use a knee brace or elastic wrap as told by your health care provider.  Keep all follow-up visits as told by your health care provider. This is important. SEEK MEDICAL CARE IF:  You have a fever.  Your knee becomes red, tender, or swollen.  Your pain medicine is not helping.  Your symptoms get worse or do not improve after 2 weeks of home  care.   This information is not intended to replace advice given to you by your health care provider. Make sure you discuss any questions you have with your health care provider.   Document Released: 11/25/2002 Document Revised: 05/26/2015 Document Reviewed: 12/28/2014 Elsevier Interactive Patient Education [employer].

## 2015-10-19 NOTE — Progress Notes (Signed)
OFFICE NOTE  Chief Complaint:  Uncontrolled hypertension  Primary Care Physician: JEFFERY,CHELLE, PA-C  HPI:  Bryan Knight is a pleasant 70 year old male who is coming referred to me for evaluation of uncontrolled hypertension. He says he's had high blood pressure for over 40 years and has had difficulty controlling it at times. He's been on a number of different medications however recently his blood pressures been difficult to control. In addition he was noted to have some chronic kidney disease with a creatinine of 1.6. Nephrology but cannot get an appointment for 6 months. Currently he is on a regimen of amlodipine 10 mg daily, lisinopril HCTZ 20/12.5 mg and metoprolol succinate 100 mg daily (which was just started by his PCP. He did bring a list of blood pressure readings at home which indicate recent blood pressures in the 170-200/90-100 range. He denies any symptoms with this. Currently his blood pressure is 158/96. He has had some shortness of breath with exertion but no chest pain. He's never had an ischemia workup. He has had 2 renal ultrasounds, last in 2012 which showed normal renal size. These were not renal artery Dopplers and therefore, I cannot comment on whether he could have renal artery stenosis.  Mr. Schuenke returns today for follow-up. Blood pressure still remains poorly controlled. He brought a list of medications and says that the only thing that seems to reduce the blood pressure acutely is clonidine. He was prescribed this by his nephrologist to use as needed for blood pressures over 180. In general his blood pressures are almost over 180 most of time. He reports his shortness of breath is improved somewhat. His echo showed diastolic dysfunction but normal systolic function. Stress testing was negative. He had renal Dopplers which showed normal renal size and parenchyma however renal blood flow was not assessed. I do not however suspect he has renal artery stenosis. He's had  long-standing hypertension which has been difficult to control. He says many years ago a Catapres patch was helpful but he had horrible dry mouth and was taken off of it.  PMHx:  Past Medical History  Diagnosis Date  . Hypertension   . Hearing loss   . Depression   . Diabetes mellitus   . Rosacea   . Thyroid disease   . Hyperlipidemia   . Cataract   . Glaucoma     Past Surgical History  Procedure Laterality Date  . Eye surgery    . Tonsillectomy      over 50 yrs ago    FAMHx:  Family History  Problem Relation Age of Onset  . Hypertension Mother   . Liver cancer Mother 22    SOCHx:   reports that he quit smoking about 48 years ago. His smoking use included Cigarettes. He does not have any smokeless tobacco history on file. He reports that he does not drink alcohol or use illicit drugs.  ALLERGIES:  Allergies  Allergen Reactions  . Citalopram   . Tussionex Pennkinetic Er [Hydrocod Polst-Cpm Polst Er] Other (See Comments)    Foggy headed  . Wellbutrin [Bupropion Hcl] Other (See Comments)    Sluggish     ROS: A comprehensive review of systems was negative.  HOME MEDS: Current Outpatient Prescriptions  Medication Sig Dispense Refill  . amLODipine (NORVASC) 10 MG tablet TAKE 1 TABLET (10 MG TOTAL) BY MOUTH DAILY 90 tablet 0  . aspirin 81 MG tablet Take 81 mg by mouth daily.     Marland Kitchen atorvastatin (LIPITOR)  20 MG tablet Take 1 tablet (20 mg total) by mouth daily. 90 tablet 3  . chlorthalidone (HYGROTON) 25 MG tablet Take 0.5 tablets (12.5 mg total) by mouth daily. 15 tablet 6  . cloNIDine (CATAPRES) 0.1 MG tablet Take 1 tablet (0.1 mg total) by mouth every evening. 30 tablet 11  . DULoxetine (CYMBALTA) 30 MG capsule Take 1 capsule (30 mg total) by mouth daily. 7 capsule 0  . glucose blood (ONETOUCH VERIO) test strip Test blood sugar once daily. Dx: E11.65 100 each 3  . latanoprost (XALATAN) 0.005 % ophthalmic solution Place 1 drop into both eyes at bedtime.    Marland Kitchen  levothyroxine (SYNTHROID, LEVOTHROID) 75 MCG tablet Take 1 tablet (75 mcg total) by mouth daily. 90 tablet 3  . lisinopril (PRINIVIL,ZESTRIL) 20 MG tablet Take 1 tablet (20 mg total) by mouth 2 (two) times daily. 180 tablet 1  . lisinopril (PRINIVIL,ZESTRIL) 40 MG tablet Take 40 mg by mouth daily.  6  . metFORMIN (GLUCOPHAGE) 500 MG tablet Take 1 tablet (500 mg total) by mouth 2 (two) times daily with a meal. 180 tablet 3  . metoprolol succinate (TOPROL XL) 100 MG 24 hr tablet Take 1 tablet (100 mg total) by mouth daily. Take with or immediately following a meal. 90 tablet 3  . montelukast (SINGULAIR) 10 MG tablet Take 10 mg by mouth at bedtime.  3  . ONETOUCH DELICA LANCETS 99991111 MISC USE TO TEST ONCE DAILY AS DIRECTED**MAX DAYS SUPPLY 90** 100 each 0  . sitaGLIPtin (JANUVIA) 100 MG tablet Take 1 tablet (100 mg total) by mouth daily. 90 tablet 3   No current facility-administered medications for this visit.    LABS/IMAGING: No results found for this or any previous visit (from the past 48 hour(s)). No results found.  WEIGHTS: Wt Readings from Last 3 Encounters:  10/19/15 172 lb 9.6 oz (78.291 kg)  10/19/15 169 lb 3.2 oz (76.749 kg)  09/28/15 175 lb 4.8 oz (79.516 kg)    VITALS: BP 172/102 mmHg  Pulse 72  Ht 5\' 10"  (1.778 m)  Wt 172 lb 9.6 oz (78.291 kg)  BMI 24.77 kg/m2  EXAM: Deferred  EKG: Deferred  ASSESSMENT: 1. Uncontrolled hypertension 2. Low risk nuclear stress test-echo with mild diastolic dysfunction 3. CKD 2  PLAN: 1.   Mr. Kubicek has uncontrolled hypertension. Blood pressure continues to be 0000000 systolic. He had good benefit from clonidine which is prescribed by his nephrologist Dr. Mercy Moore. Dr. Mercy Moore indicated he wished to adjust his blood pressure medications. I would suggest that Mr. Canedo try to take his clonidine on a nightly basis and record his blood pressures that Dr. Mercy Moore could review with her upcoming visit. I will defer further adjustment of  his medications to his nephrologist.   Pixie Casino, MD, [facility] Attending Cardiologist [place] 10/19/2015, 6:15 PM

## 2015-10-19 NOTE — Progress Notes (Signed)
Patient ID: Bryan Knight, male    DOB: 03/03/1946, 70 y.o.   MRN: [id number]  PCP: Aadan Chenier, PA-C  Subjective:   Chief Complaint  Patient presents with  . Knee Pain    LEFT x 1 month    HPI Presents for evaluation of left knee pain x 2 weeks.   Left knee pain noted during last visit, 09/14/15, evaluation deferred. No acute trauma noted. Pain is an on/off pain located on the medial portion of the left knee, lasting 15-20 minutes. No notable factors associated before onset. No aggravating factors after pain onset. No relief with 2 Tylenol prn. Rates as 6-7/10. Denies erythema, swelling, ecchymosis, bony deformities, decrease in ROM, morning stiffness, stiffness after periods of rest, relief with walking, tenderness to palpation, antalgic gait, numbness, tingling or weakness.   Review of Systems Constitutional: Negative for fever, chills and diaphoresis.  Respiratory: Negative for cough, chest tightness and shortness of breath.  Cardiovascular: Negative for chest pain, palpitations and leg swelling.   No claudication  Genitourinary: Negative for urgency, frequency and difficulty urinating.  Musculoskeletal: Positive for arthralgias (left knee). Negative for myalgias, joint swelling and gait problem.   Denies bony deformities, decreased ROM, morning stiffness, stiffness after periods of rest, relief with walking, tenderness to palpation, antalgic gait.  Skin: Negative for color change, pallor, rash and wound.   No erythema, ecchymosis, or swelling  Neurological: Negative for weakness and numbness.     Patient Active Problem List   Diagnosis Date Noted  . Diastolic dysfunction 123456  . Microalbuminuria 04/02/2015  . Depression 04/02/2015  . Foreign body in esophagus 10/24/2013  . B12 deficiency 01/11/2012  . Hypertension 10/20/2011  . Diabetes mellitus type II, controlled ([place]) 10/20/2011  . Hypothyroid 10/20/2011  . Rosacea 10/20/2011      Prior to Admission medications   Medication Sig Start Date End Date Taking? Authorizing Provider  amLODipine (NORVASC) 10 MG tablet TAKE 1 TABLET (10 MG TOTAL) BY MOUTH DAILY 08/19/15  Yes [name], MD  aspirin 81 MG tablet Take 81 mg by mouth daily.    Yes Historical Provider, MD  atorvastatin (LIPITOR) 20 MG tablet Take 1 tablet (20 mg total) by mouth daily. 08/11/15  Yes Marivel Mcclarty, PA-C  chlorthalidone (HYGROTON) 25 MG tablet Take 0.5 tablets (12.5 mg total) by mouth daily. 09/06/15  Yes Pixie Casino, MD  cloNIDine (CATAPRES) 0.1 MG tablet Take 1 tablet by mouth up to every 6 hours as needed for systolic BP > 99991111. [id number]  Yes Pixie Casino, MD  DULoxetine (CYMBALTA) 30 MG capsule Take 1 capsule (30 mg total) by mouth daily. 04/02/15  Yes Summar Mcglothlin, PA-C  latanoprost (XALATAN) 0.005 % ophthalmic solution Place 1 drop into both eyes at bedtime.   Yes Historical Provider, MD  levothyroxine (SYNTHROID, LEVOTHROID) 75 MCG tablet Take 1 tablet (75 mcg total) by mouth daily. 05/06/15  Yes Zharia Conrow, PA-C  lisinopril (PRINIVIL,ZESTRIL) 20 MG tablet Take 1 tablet (20 mg total) by mouth 2 (two) times daily. 09/29/15  Yes Pixie Casino, MD  metFORMIN (GLUCOPHAGE) 500 MG tablet Take 1 tablet (500 mg total) by mouth 2 (two) times daily with a meal. 08/11/15  Yes Quinlan Mcfall, PA-C  metoprolol succinate (TOPROL XL) 100 MG 24 hr tablet Take 1 tablet (100 mg total) by mouth daily. Take with or immediately following a meal. 08/10/15  Yes Safiatou Islam, PA-C  montelukast (SINGULAIR) 10 MG tablet Take 10 mg by mouth at bedtime. 06/12/15  Yes Historical Provider, MD  sitaGLIPtin (JANUVIA) 100 MG tablet Take 1 tablet (100 mg total) by mouth daily. 04/06/15  Yes Jersey Ravenscroft, PA-C  glucose blood (ONETOUCH VERIO) test strip Test blood sugar once daily. Dx: E11.65 08/30/15   Harrison Mons, PA-C  lisinopril (PRINIVIL,ZESTRIL) 40 MG tablet Take 40 mg by mouth daily. 09/30/15    Historical Provider, MD  Jonetta Speak LANCETS [id number] MISC USE TO TEST ONCE DAILY AS DIRECTED**MAX DAYS SUPPLY 90** 09/16/15   Ellenor Wisniewski, PA-C     Allergies  Allergen Reactions  . Citalopram   . Tussionex Pennkinetic Er [Hydrocod Polst-Cpm Polst Er] Other (See Comments)    Foggy headed  . Wellbutrin [Bupropion Hcl] Other (See Comments)    Sluggish        Objective:  Physical Exam  Constitutional: He is oriented to person, place, and time. He appears well-developed and well-nourished. He is active and cooperative. No distress.  BP 163/74 mmHg  Pulse 66  Temp(Src) 97.4 F (36.3 C) (Oral)  Resp 16  Ht 5' 8.5" (1.74 m)  Wt 169 lb 3.2 oz (76.749 kg)  BMI 25.35 kg/m2  SpO2 98%   Eyes: Conjunctivae are normal.  Pulmonary/Chest: Effort normal.  Musculoskeletal:       Right knee: Normal.       Left knee: He exhibits abnormal meniscus (pain medial LEFT knee with McMurray's). He exhibits normal range of motion, no swelling, no effusion, no ecchymosis, no deformity, no laceration, no erythema, normal alignment, no LCL laxity, normal patellar mobility, no bony tenderness and no MCL laxity. No tenderness found.  Neurological: He is alert and oriented to person, place, and time.  Psychiatric: He has a normal mood and affect. His speech is normal and behavior is normal.           Assessment & Plan:   1. Knee pain, left Suspect medial meniscal tear. Treatment options are limited due to diabetes and chronic renal failure. He's having only brief pain now, and it's tolerable, not limiting his daily activities. If that changes, he'll let me know and I'll refer for orthopedic evaluation, likely to include MRI.   Fara Chute, PA-C Physician Assistant-Certified [facility]

## 2015-10-19 NOTE — Patient Instructions (Signed)
Dr Debara Pickett has recommended making the following medication changes: TAKE Clonidine daily at night  Your physician recommends that you schedule a follow-up appointment in 6 months. You will receive a reminder letter in the mail two months in advance. If you don't receive a letter, please call our office to schedule the follow-up appointment.  If you need a refill on your cardiac medications before your next appointment, please call your pharmacy.

## 2015-10-21 ENCOUNTER — Encounter: Payer: Self-pay | Admitting: Physician Assistant

## 2015-10-21 DIAGNOSIS — M25562 Pain in left knee: Secondary | ICD-10-CM

## 2015-10-25 DIAGNOSIS — S83242A Other tear of medial meniscus, current injury, left knee, initial encounter: Secondary | ICD-10-CM | POA: Diagnosis not present

## 2015-10-29 DIAGNOSIS — M25562 Pain in left knee: Secondary | ICD-10-CM | POA: Diagnosis not present

## 2015-11-02 DIAGNOSIS — M25562 Pain in left knee: Secondary | ICD-10-CM | POA: Diagnosis not present

## 2015-11-03 ENCOUNTER — Other Ambulatory Visit: Payer: Self-pay | Admitting: Physician Assistant

## 2015-11-10 DIAGNOSIS — M23262 Derangement of other lateral meniscus due to old tear or injury, left knee: Secondary | ICD-10-CM | POA: Diagnosis not present

## 2015-11-10 DIAGNOSIS — M23232 Derangement of other medial meniscus due to old tear or injury, left knee: Secondary | ICD-10-CM | POA: Diagnosis not present

## 2015-11-10 DIAGNOSIS — G8918 Other acute postprocedural pain: Secondary | ICD-10-CM | POA: Diagnosis not present

## 2015-11-10 DIAGNOSIS — S83282A Other tear of lateral meniscus, current injury, left knee, initial encounter: Secondary | ICD-10-CM | POA: Diagnosis not present

## 2015-11-10 DIAGNOSIS — M1712 Unilateral primary osteoarthritis, left knee: Secondary | ICD-10-CM | POA: Diagnosis not present

## 2015-11-10 DIAGNOSIS — S83242A Other tear of medial meniscus, current injury, left knee, initial encounter: Secondary | ICD-10-CM | POA: Diagnosis not present

## 2015-11-10 HISTORY — PX: KNEE ARTHROSCOPY WITH MEDIAL MENISECTOMY: SHX5651

## 2015-11-13 ENCOUNTER — Other Ambulatory Visit: Payer: Self-pay | Admitting: Family Medicine

## 2015-11-16 ENCOUNTER — Encounter: Payer: Self-pay | Admitting: Physician Assistant

## 2015-11-16 ENCOUNTER — Ambulatory Visit (INDEPENDENT_AMBULATORY_CARE_PROVIDER_SITE_OTHER): Payer: Medicare Other | Admitting: Physician Assistant

## 2015-11-16 VITALS — BP 160/82 | HR 68 | Temp 97.3°F | Resp 16 | Ht 69.0 in | Wt 167.0 lb

## 2015-11-16 DIAGNOSIS — E119 Type 2 diabetes mellitus without complications: Secondary | ICD-10-CM

## 2015-11-16 DIAGNOSIS — I10 Essential (primary) hypertension: Secondary | ICD-10-CM | POA: Diagnosis not present

## 2015-11-16 DIAGNOSIS — R490 Dysphonia: Secondary | ICD-10-CM | POA: Diagnosis not present

## 2015-11-16 NOTE — Patient Instructions (Addendum)
Because you received labwork today, you will receive an invoice from [employer]. Please contact [facility] at [phone] with questions or concerns regarding your invoice. Our billing staff will not be able to assist you with those questions.  You will be contacted with the lab results as soon as they are available. The fastest way to get your results is to activate your My Chart account. Instructions are located on the last page of this paperwork. If you have not heard from [place] regarding the results in 2 weeks, please contact this office.  Try going back to the clonidine dosing the way Dr. Mercy Moore ordered.  Try ACT toothpaste/mouthwash/lozenges for dry mouth.

## 2015-11-16 NOTE — Progress Notes (Signed)
Patient ID: Bryan Knight, male    DOB: 06-02-1946, 70 y.o.   MRN: [id number]  PCP: Saman Umstead, PA-C  Subjective:   Chief Complaint  Patient presents with  . Follow-up  . Hypertension    HPI Presents for evaluation of uncontrolled HTN.   Has seen multiple doctors and specialists, undergone multiple tests, tried a wide variety of medications with no results. Had a two year history where his BP was under control but then the medications stopped working. Doesn't remember which medications those were. His BP today was 151/76. He keeps a log book of his BP, pulse, and sugars and has provided documentation from Jan 10 to Feb 28. His highest is 188/108, his regular pressure is most often 150-160/80-90s.Marland Kitchen His blood sugars are usually 140-170. His pulse ranges from 58-72bpm.  Saw nephrologist early in February who suggested to take clonidine when systolic was over 99991111, was scheduled to return on 2/23 but couldn't due to knee surgery. Is scheduled to return 3/17. Saw his cardiologist who suggested taking the clonidine every day. Is currently taking amlodipine 10mg  qd, Clonidine 0.1mg  qd, chlorthalidone 12.5 qd, and metoprolol 100mg  BID. Is not following any specific diet, but does report not adding any salt to his food. Denies headaches, vision changes, or chest pain.   Had LEFT knee meniscus surgery 2/22. Reports elevated blood sugars the few days following his surgery but have mostly returned to normal. Started physical therapy yesterday and pain started up. Isn't hurting currently. Follow up appointment with ortho in 2 days.   Also complains of a hoarse voice x2-3 days. Not associated with congestion, rhinorhea, sore throat, or cough. Has never had hoarse voice before. Only new medication has been clonidine. He smoked about a half pack/day for 4 years but quit in 1969. Hs voice is driving his wife "crazy."   He also had a question about his diabetes medication. He was switched to [place] with  his metformin recently and feels like his blood sugars have been higher ever since.    Review of Systems Constitutional: Negative for fever, chills, fatigue and unexpected weight change.  HENT: Positive for voice change (Hoarse x2-3 days). Negative for congestion, hearing loss, rhinorrhea and sore throat.  Eyes: Negative for photophobia.  Respiratory: Negative for cough and shortness of breath.  Cardiovascular: Negative for chest pain and leg swelling.  Gastrointestinal: Negative for nausea, vomiting, abdominal pain, diarrhea and constipation.  Endocrine: Negative for cold intolerance.  Genitourinary: Negative for dysuria and difficulty urinating.  Musculoskeletal: Positive for joint swelling (Left knee) and arthralgias (Left knee).  Neurological: Negative for dizziness, light-headedness, numbness and headaches.     Patient Active Problem List   Diagnosis Date Noted  . Knee pain, left 10/19/2015  . Diastolic dysfunction 123456  . Microalbuminuria 04/02/2015  . Depression 04/02/2015  . Foreign body in esophagus 10/24/2013  . B12 deficiency 01/11/2012  . Hypertension 10/20/2011  . Diabetes mellitus type II, controlled ([place]) 10/20/2011  . Hypothyroid 10/20/2011  . Rosacea 10/20/2011     Prior to Admission medications   Medication Sig Start Date End Date Taking? Authorizing Provider  amLODipine (NORVASC) 10 MG tablet TAKE 1 TABLET (10 MG TOTAL) BY MOUTH DAILY 08/19/15  Yes [name], MD  aspirin 81 MG tablet Take 81 mg by mouth daily.    Yes Historical Provider, MD  atorvastatin (LIPITOR) 20 MG tablet Take 1 tablet (20 mg total) by mouth daily. 08/11/15  Yes Young Mulvey, PA-C  chlorthalidone (HYGROTON) 25 MG tablet  Take 0.5 tablets (12.5 mg total) by mouth daily. 09/06/15  Yes Pixie Casino, MD  cloNIDine (CATAPRES) 0.1 MG tablet Take 1 tablet (0.1 mg total) by mouth every evening. 10/19/15  Yes Pixie Casino, MD  DULoxetine (CYMBALTA) 30 MG capsule TAKE 1 CAPSULE  DAILY 11/03/15  Yes [name], MD  glucose blood (ONETOUCH VERIO) test strip Test blood sugar once daily. Dx: E11.65 08/30/15  Yes Taela Charbonneau, PA-C  latanoprost (XALATAN) 0.005 % ophthalmic solution Place 1 drop into both eyes at bedtime.   Yes Historical Provider, MD  levothyroxine (SYNTHROID, LEVOTHROID) 75 MCG tablet Take 1 tablet (75 mcg total) by mouth daily. 05/06/15  Yes Levis Nazir, PA-C  lisinopril (PRINIVIL,ZESTRIL) 40 MG tablet Take 40 mg by mouth daily. 09/30/15  Yes Historical Provider, MD  metFORMIN (GLUCOPHAGE) 500 MG tablet Take 1 tablet (500 mg total) by mouth 2 (two) times daily with a meal. 08/11/15  Yes Ruie Sendejo, PA-C  metoprolol succinate (TOPROL XL) 100 MG 24 hr tablet Take 1 tablet (100 mg total) by mouth daily. Take with or immediately following a meal. 08/10/15  Yes Jadavion Spoelstra, PA-C  montelukast (SINGULAIR) 10 MG tablet Take 10 mg by mouth at bedtime. 06/12/15  Yes Historical Provider, MD  Jonetta Speak LANCETS 99991111 MISC USE TO TEST ONCE DAILY AS DIRECTED**MAX DAYS SUPPLY 90** 09/16/15  Yes Jackquelyn Sundberg, PA-C  sitaGLIPtin (JANUVIA) 100 MG tablet Take 1 tablet (100 mg total) by mouth daily. 04/06/15  Yes Harrison Mons, PA-C     Allergies  Allergen Reactions  . Citalopram     Ineffective  . Tussionex Pennkinetic Er [Hydrocod Polst-Cpm Polst Er] Other (See Comments)    Foggy headed  . Wellbutrin [Bupropion Hcl] Other (See Comments)    Sluggish        Objective:  Physical Exam  Constitutional: He is oriented to person, place, and time. He appears well-developed and well-nourished. He is active and cooperative. No distress.  BP 160/82 mmHg  Pulse 68  Temp(Src) 97.3 F (36.3 C)  Resp 16  Ht 5\' 9"  (1.753 m)  Wt 167 lb (75.751 kg)  BMI 24.65 kg/m2  HENT:  Head: Normocephalic and atraumatic.  Right Ear: Hearing, tympanic membrane, external ear and ear canal normal.  Left Ear: Hearing, tympanic membrane, external ear and ear canal normal.    Nose: Rhinorrhea present. No mucosal edema.  Mouth/Throat: Uvula is midline, oropharynx is clear and moist and mucous membranes are normal. No uvula swelling.  Eyes: Conjunctivae are normal. No scleral icterus.  Neck: Normal range of motion, full passive range of motion without pain and phonation normal. Neck supple. No thyromegaly present.  I don not appreciate a voice change, though the patient states that he is hoarse.  Cardiovascular: Normal rate, regular rhythm and normal heart sounds.   Pulses:      Radial pulses are 2+ on the right side, and 2+ on the left side.  Pulmonary/Chest: Effort normal and breath sounds normal.  Lymphadenopathy:       Head (right side): No tonsillar, no preauricular, no posterior auricular and no occipital adenopathy present.       Head (left side): No tonsillar, no preauricular, no posterior auricular and no occipital adenopathy present.    He has no cervical adenopathy.       Right: No supraclavicular adenopathy present.       Left: No supraclavicular adenopathy present.  Neurological: He is alert and oriented to person, place, and time. No sensory deficit.  Skin: Skin is warm, dry and intact. No rash noted. No cyanosis or erythema. Nails show no clubbing.  Psychiatric: He has a normal mood and affect. His speech is normal and behavior is normal.           Assessment & Plan:   1. Essential hypertension Continue follow-up with nephrology. - CBC with Differential/Platelet  2. Hoarseness of voice The only change is the increase in the clonidine from PRN when SBP >180 to daily. Recommend reducing back to using it only when SBP>180. If symptoms persist, consider ENT referral, which he is very opposed to.  3. Controlled type 2 diabetes mellitus without complication, without long-term current use of insulin ([place]) It does not make sense that the glucose readings would RISE with the addition of Januvia. Await A1C. Metformin use is limited, given his renal  function, but lack of diabetes control is also a problem from a renal standpoint. - Comprehensive metabolic panel - Hemoglobin A1c   Fara Chute, PA-C Physician Assistant-Certified [facility] [place]

## 2015-11-16 NOTE — Progress Notes (Signed)
Subjective:    Patient ID: Bryan Knight, male    DOB: 04-16-1946, 70 y.o.   MRN: [id number]  Chief Complaint  Patient presents with  . Follow-up  . Hypertension   HPI  Bryan Knight is a 41 year old caucasian male who presents today with a 45 year history of uncontroled HTN. Has seen multiple doctors and specialists, undergone multiple tests, tried a wide variety of medications with no results. Had a two year history where his BP was under control but then the medications stopped working. Doesn't remember which medications those were. His BP today was 151/76. He keeps a log book of his BP, pulse, and sugars and has provided documentation from Jan 10 to Feb 28.  His highest is 188/108, his regular pressure is most often 150-160/80-90s.Marland Kitchen His blood sugars are usually 140-170. His pulse ranges from 58-72bpm.  Saw nephrologist early in February who suggested to take clonidine when systolic was over 99991111, was scheduled to return on 2/23 but couldn't due to knee surgery. Is scheduled to return 3/17. Saw his cardiologist who suggested taking the clonidine every day. Is currently taking amlodipine 10mg  qd, Clonidine .1mg  qd, chlorthalidone 12.5 qd, and metoprolol 100mg  BID. Is not following any specific die, but does report not adding any salt to his food. Denies headaches, vision changes, or chest pain.   Had an operation on L knee meniscus 2/22. Reports elevated blood sugars the few days following his surgery but have mostly returned to normal.Started physical therapy yesterday and pain started up. Isn't hurting currently. Follow up appointment with ortho on Thursday.   Also complains of a hoarse voice x2-3 days. Not associated with congestion, rhinorhea, sore throat, or cough. Has never had hoarse voice before. Only new medication has been clonidine. He smoked about a half pack/day for 4 years but quit in 1969.  Hs voice is driving his wife crazy.   He also had a question about his diabetes medication. He  was switched to [place] with his metformin recently and feels like his blood sugars have been higher ever since.   PMH, FH, and SH were all reviewed with patient and updated as needed.  Allergies  Allergen Reactions  . Citalopram   . Tussionex Pennkinetic Er [Hydrocod Polst-Cpm Polst Er] Other (See Comments)    Foggy headed  . Wellbutrin [Bupropion Hcl] Other (See Comments)    Sluggish    Prior to Admission medications   Medication Sig Start Date End Date Taking? Authorizing Provider  amLODipine (NORVASC) 10 MG tablet TAKE 1 TABLET (10 MG TOTAL) BY MOUTH DAILY 08/19/15  Yes [name], MD  aspirin 81 MG tablet Take 81 mg by mouth daily.    Yes Historical Provider, MD  atorvastatin (LIPITOR) 20 MG tablet Take 1 tablet (20 mg total) by mouth daily. 08/11/15  Yes Chelle Jeffery, PA-C  chlorthalidone (HYGROTON) 25 MG tablet Take 0.5 tablets (12.5 mg total) by mouth daily. 09/06/15  Yes Pixie Casino, MD  cloNIDine (CATAPRES) 0.1 MG tablet Take 1 tablet (0.1 mg total) by mouth every evening. 10/19/15  Yes Pixie Casino, MD  DULoxetine (CYMBALTA) 30 MG capsule TAKE 1 CAPSULE DAILY 11/03/15  Yes [name], MD  glucose blood (ONETOUCH VERIO) test strip Test blood sugar once daily. Dx: E11.65 08/30/15  Yes Chelle Jeffery, PA-C  latanoprost (XALATAN) 0.005 % ophthalmic solution Place 1 drop into both eyes at bedtime.   Yes Historical Provider, MD  levothyroxine (SYNTHROID, LEVOTHROID) 75 MCG tablet Take 1  tablet (75 mcg total) by mouth daily. 05/06/15  Yes Chelle Jeffery, PA-C  lisinopril (PRINIVIL,ZESTRIL) 40 MG tablet Take 40 mg by mouth daily. 09/30/15  Yes Historical Provider, MD  metFORMIN (GLUCOPHAGE) 500 MG tablet Take 1 tablet (500 mg total) by mouth 2 (two) times daily with a meal. 08/11/15  Yes Chelle Jeffery, PA-C  metoprolol succinate (TOPROL XL) 100 MG 24 hr tablet Take 1 tablet (100 mg total) by mouth daily. Take with or immediately following a meal. 08/10/15  Yes Chelle  Jeffery, PA-C  montelukast (SINGULAIR) 10 MG tablet Take 10 mg by mouth at bedtime. 06/12/15  Yes Historical Provider, MD  Jonetta Speak LANCETS 99991111 MISC USE TO TEST ONCE DAILY AS DIRECTED**MAX DAYS SUPPLY 90** 09/16/15  Yes Chelle Jeffery, PA-C  sitaGLIPtin (JANUVIA) 100 MG tablet Take 1 tablet (100 mg total) by mouth daily. 04/06/15  Yes Harrison Mons, PA-C     Review of Systems  Constitutional: Negative for fever, chills, fatigue and unexpected weight change.  HENT: Positive for voice change (Hoarse x2-3 days). Negative for congestion, hearing loss, rhinorrhea and sore throat.   Eyes: Negative for photophobia.  Respiratory: Negative for cough and shortness of breath.   Cardiovascular: Negative for chest pain and leg swelling.  Gastrointestinal: Negative for nausea, vomiting, abdominal pain, diarrhea and constipation.  Endocrine: Negative for cold intolerance.  Genitourinary: Negative for dysuria and difficulty urinating.  Musculoskeletal: Positive for joint swelling (Left knee) and arthralgias (Left knee).  Neurological: Negative for dizziness, light-headedness, numbness and headaches.      Objective:   Physical Exam  Constitutional: He is oriented to person, place, and time. He appears well-developed and well-nourished. No distress.  Blood pressure 160/82, pulse 68, temperature 97.3 F (36.3 C), resp. rate 16, height 5\' 9"  (1.753 m), weight 167 lb (75.751 kg).   HENT:  Head: Normocephalic and atraumatic.  Right Ear: Tympanic membrane, external ear and ear canal normal.  Left Ear: Tympanic membrane, external ear and ear canal normal.  Nose: Rhinorrhea present.  Mouth/Throat: Oropharynx is clear and moist. No oropharyngeal exudate.  Eyes: Conjunctivae and EOM are normal. Pupils are equal, round, and reactive to light. No scleral icterus.  Neck: Normal range of motion. Neck supple. No thyromegaly present.  Cardiovascular: Normal rate, regular rhythm, S1 normal, S2 normal and  intact distal pulses.  Exam reveals no gallop and no friction rub.   No murmur heard. Pulses:      Radial pulses are 2+ on the right side, and 2+ on the left side.       Dorsalis pedis pulses are 2+ on the right side, and 2+ on the left side.  Pulmonary/Chest: Effort normal. He has no wheezes. He has no rales.  Lymphadenopathy:       Head (right side): No submental, no submandibular, no tonsillar, no preauricular, no posterior auricular and no occipital adenopathy present.       Head (left side): No submental, no submandibular, no tonsillar, no preauricular, no posterior auricular and no occipital adenopathy present.       Right: No supraclavicular adenopathy present.       Left: No supraclavicular adenopathy present.  Neurological: He is alert and oriented to person, place, and time. He has normal strength and normal reflexes. No sensory deficit.  Reflex Scores:      Bicep reflexes are 2+ on the right side and 2+ on the left side.      Patellar reflexes are 2+ on the right side and 2+ on  the left side.      Achilles reflexes are 2+ on the right side and 2+ on the left side. Skin: Skin is warm and dry.  Psychiatric: He has a normal mood and affect. His behavior is normal. Thought content normal.  Hoarse voice      Assessment & Plan:  1. Essential hypertension - CBC with Differential/Platelet  2. Hoarseness of voice  3. Controlled type 2 diabetes mellitus without complication, without long-term current use of insulin ([facility]) - Comprehensive metabolic panel - Hemoglobin A1c  Will check basic lab work to monitor his HTN, DM and the effectiveness of the medications. Encouraged patient to continue with lifestyle modifications. Will see again in 3 months for re-evaluation.   [place]

## 2015-11-17 ENCOUNTER — Other Ambulatory Visit: Payer: Self-pay | Admitting: Family Medicine

## 2015-11-17 LAB — CBC WITH DIFFERENTIAL/PLATELET
BASOS ABS: 0 10*3/uL (ref 0.0–0.1)
Basophils Relative: 0 % (ref 0–1)
EOS ABS: 0.2 10*3/uL (ref 0.0–0.7)
EOS PCT: 1 % (ref 0–5)
HCT: 46.4 % (ref 39.0–52.0)
Hemoglobin: 16.5 g/dL (ref 13.0–17.0)
LYMPHS ABS: 1.9 10*3/uL (ref 0.7–4.0)
Lymphocytes Relative: 12 % (ref 12–46)
MCH: 29.5 pg (ref 26.0–34.0)
MCHC: 35.6 g/dL (ref 30.0–36.0)
MCV: 82.9 fL (ref 78.0–100.0)
MONO ABS: 1.8 10*3/uL — AB (ref 0.1–1.0)
MPV: 9.8 fL (ref 8.6–12.4)
Monocytes Relative: 11 % (ref 3–12)
Neutro Abs: 12.2 10*3/uL — ABNORMAL HIGH (ref 1.7–7.7)
Neutrophils Relative %: 76 % (ref 43–77)
PLATELETS: 378 10*3/uL (ref 150–400)
RBC: 5.6 MIL/uL (ref 4.22–5.81)
RDW: 14 % (ref 11.5–15.5)
WBC: 16 10*3/uL — AB (ref 4.0–10.5)

## 2015-11-17 LAB — COMPREHENSIVE METABOLIC PANEL
ALT: 29 U/L (ref 9–46)
AST: 20 U/L (ref 10–35)
Albumin: 4.4 g/dL (ref 3.6–5.1)
Alkaline Phosphatase: 62 U/L (ref 40–115)
BUN: 44 mg/dL — ABNORMAL HIGH (ref 7–25)
CO2: 22 mmol/L (ref 20–31)
Calcium: 9.8 mg/dL (ref 8.6–10.3)
Chloride: 93 mmol/L — ABNORMAL LOW (ref 98–110)
Creat: 2 mg/dL — ABNORMAL HIGH (ref 0.70–1.18)
Glucose, Bld: 173 mg/dL — ABNORMAL HIGH (ref 65–99)
POTASSIUM: 3.4 mmol/L — AB (ref 3.5–5.3)
Sodium: 137 mmol/L (ref 135–146)
Total Bilirubin: 0.7 mg/dL (ref 0.2–1.2)
Total Protein: 7.3 g/dL (ref 6.1–8.1)

## 2015-11-17 LAB — HEMOGLOBIN A1C
HEMOGLOBIN A1C: 7.6 % — AB (ref <5.7)
MEAN PLASMA GLUCOSE: 171 mg/dL — AB (ref <117)

## 2015-11-18 DIAGNOSIS — S83242D Other tear of medial meniscus, current injury, left knee, subsequent encounter: Secondary | ICD-10-CM | POA: Diagnosis not present

## 2015-11-18 DIAGNOSIS — S83282D Other tear of lateral meniscus, current injury, left knee, subsequent encounter: Secondary | ICD-10-CM | POA: Diagnosis not present

## 2015-11-19 ENCOUNTER — Encounter: Payer: Self-pay | Admitting: Physician Assistant

## 2015-11-21 ENCOUNTER — Encounter: Payer: Self-pay | Admitting: Physician Assistant

## 2015-11-29 DIAGNOSIS — S83282D Other tear of lateral meniscus, current injury, left knee, subsequent encounter: Secondary | ICD-10-CM | POA: Diagnosis not present

## 2015-11-29 DIAGNOSIS — S83242D Other tear of medial meniscus, current injury, left knee, subsequent encounter: Secondary | ICD-10-CM | POA: Diagnosis not present

## 2015-12-03 DIAGNOSIS — E1122 Type 2 diabetes mellitus with diabetic chronic kidney disease: Secondary | ICD-10-CM | POA: Diagnosis not present

## 2015-12-03 DIAGNOSIS — R809 Proteinuria, unspecified: Secondary | ICD-10-CM | POA: Diagnosis not present

## 2015-12-03 DIAGNOSIS — N183 Chronic kidney disease, stage 3 (moderate): Secondary | ICD-10-CM | POA: Diagnosis not present

## 2015-12-03 DIAGNOSIS — I129 Hypertensive chronic kidney disease with stage 1 through stage 4 chronic kidney disease, or unspecified chronic kidney disease: Secondary | ICD-10-CM | POA: Diagnosis not present

## 2015-12-09 DIAGNOSIS — I129 Hypertensive chronic kidney disease with stage 1 through stage 4 chronic kidney disease, or unspecified chronic kidney disease: Secondary | ICD-10-CM | POA: Diagnosis not present

## 2015-12-16 ENCOUNTER — Other Ambulatory Visit: Payer: Self-pay | Admitting: Physician Assistant

## 2015-12-20 DIAGNOSIS — S83282D Other tear of lateral meniscus, current injury, left knee, subsequent encounter: Secondary | ICD-10-CM | POA: Diagnosis not present

## 2015-12-20 DIAGNOSIS — S83241D Other tear of medial meniscus, current injury, right knee, subsequent encounter: Secondary | ICD-10-CM | POA: Diagnosis not present

## 2015-12-20 DIAGNOSIS — M25562 Pain in left knee: Secondary | ICD-10-CM | POA: Diagnosis not present

## 2015-12-28 ENCOUNTER — Other Ambulatory Visit: Payer: Self-pay | Admitting: Physician Assistant

## 2015-12-29 ENCOUNTER — Ambulatory Visit (INDEPENDENT_AMBULATORY_CARE_PROVIDER_SITE_OTHER): Payer: Medicare Other | Admitting: Podiatry

## 2015-12-29 ENCOUNTER — Encounter: Payer: Self-pay | Admitting: Podiatry

## 2015-12-29 DIAGNOSIS — M79673 Pain in unspecified foot: Secondary | ICD-10-CM

## 2015-12-29 DIAGNOSIS — B351 Tinea unguium: Secondary | ICD-10-CM | POA: Diagnosis not present

## 2015-12-29 NOTE — Progress Notes (Signed)
Patient ID: Bryan Knight, male   DOB: 05/11/1946, 70 y.o.   MRN: [id number]  Complaint:  Visit Type: Patient returns to my office for continued preventative foot care services. Complaint: Patient states" my nails have grown long and thick and become painful to walk and wear shoes" Patient has been diagnosed with DM with no complications. He presents for preventative foot care services. No changes to ROS  Podiatric Exam: Vascular: dorsalis pedis and posterior tibial pulses are palpable bilateral. Capillary return is immediate. Temperature gradient is WNL. Skin turgor WNL  Sensorium: Normal Semmes Weinstein monofilament test. Normal tactile sensation bilaterally. Nail Exam: Pt has thick disfigured discolored nails with subungual debris noted bilateral entire nail hallux through fifth toenails Ulcer Exam: There is no evidence of ulcer or pre-ulcerative changes or infection. Orthopedic Exam: Muscle tone and strength are WNL. No limitations in general ROM. No crepitus or effusions noted. Foot type and digits show no abnormalities. Bony prominences are unremarkable. Skin: No Porokeratosis. No infection or ulcers  Diagnosis:  Tinea unguium, Pain in right toe, pain in left toes  Treatment & Plan Procedures and Treatment: Consent by patient was obtained for treatment procedures. The patient understood the discussion of treatment and procedures well. All questions were answered thoroughly reviewed. Debridement of mycotic and hypertrophic toenails, 1 through 5 bilateral and clearing of subungual debris. No ulceration, no infection noted.  Return Visit-Office Procedure: Patient instructed to return to the office for a follow up visit 3 months for continued evaluation and treatment.   Coretta Leisey DPM 

## 2015-12-30 DIAGNOSIS — S83242D Other tear of medial meniscus, current injury, left knee, subsequent encounter: Secondary | ICD-10-CM | POA: Diagnosis not present

## 2015-12-30 DIAGNOSIS — S83282D Other tear of lateral meniscus, current injury, left knee, subsequent encounter: Secondary | ICD-10-CM | POA: Diagnosis not present

## 2016-01-20 DIAGNOSIS — N183 Chronic kidney disease, stage 3 (moderate): Secondary | ICD-10-CM | POA: Diagnosis not present

## 2016-01-20 DIAGNOSIS — I129 Hypertensive chronic kidney disease with stage 1 through stage 4 chronic kidney disease, or unspecified chronic kidney disease: Secondary | ICD-10-CM | POA: Diagnosis not present

## 2016-01-20 DIAGNOSIS — R809 Proteinuria, unspecified: Secondary | ICD-10-CM | POA: Diagnosis not present

## 2016-01-20 DIAGNOSIS — E1122 Type 2 diabetes mellitus with diabetic chronic kidney disease: Secondary | ICD-10-CM | POA: Diagnosis not present

## 2016-02-04 ENCOUNTER — Other Ambulatory Visit: Payer: Self-pay

## 2016-02-04 MED ORDER — DULOXETINE HCL 30 MG PO CPEP: 30.0000 mg | ORAL_CAPSULE | Freq: Every day | ORAL | Status: DC

## 2016-02-15 ENCOUNTER — Encounter: Payer: Self-pay | Admitting: Physician Assistant

## 2016-02-15 ENCOUNTER — Ambulatory Visit (INDEPENDENT_AMBULATORY_CARE_PROVIDER_SITE_OTHER): Payer: Medicare Other | Admitting: Physician Assistant

## 2016-02-15 VITALS — BP 128/80 | HR 69 | Temp 97.9°F | Resp 16 | Ht 68.5 in | Wt 173.6 lb

## 2016-02-15 DIAGNOSIS — Z1211 Encounter for screening for malignant neoplasm of colon: Secondary | ICD-10-CM | POA: Diagnosis not present

## 2016-02-15 DIAGNOSIS — E119 Type 2 diabetes mellitus without complications: Secondary | ICD-10-CM

## 2016-02-15 DIAGNOSIS — R609 Edema, unspecified: Secondary | ICD-10-CM | POA: Diagnosis not present

## 2016-02-15 DIAGNOSIS — I10 Essential (primary) hypertension: Secondary | ICD-10-CM | POA: Diagnosis not present

## 2016-02-15 DIAGNOSIS — R600 Localized edema: Secondary | ICD-10-CM

## 2016-02-15 DIAGNOSIS — Z23 Encounter for immunization: Secondary | ICD-10-CM

## 2016-02-15 LAB — COMPREHENSIVE METABOLIC PANEL
ALK PHOS: 69 U/L (ref 40–115)
ALT: 15 U/L (ref 9–46)
AST: 15 U/L (ref 10–35)
Albumin: 4.5 g/dL (ref 3.6–5.1)
BUN: 22 mg/dL (ref 7–25)
CALCIUM: 9.3 mg/dL (ref 8.6–10.3)
CHLORIDE: 98 mmol/L (ref 98–110)
CO2: 29 mmol/L (ref 20–31)
Creat: 1.57 mg/dL — ABNORMAL HIGH (ref 0.70–1.18)
Glucose, Bld: 197 mg/dL — ABNORMAL HIGH (ref 65–99)
POTASSIUM: 3.4 mmol/L — AB (ref 3.5–5.3)
Sodium: 137 mmol/L (ref 135–146)
TOTAL PROTEIN: 7 g/dL (ref 6.1–8.1)
Total Bilirubin: 0.6 mg/dL (ref 0.2–1.2)

## 2016-02-15 LAB — HEMOGLOBIN A1C
Hgb A1c MFr Bld: 7.6 % — ABNORMAL HIGH (ref <5.7)
Mean Plasma Glucose: 171 mg/dL

## 2016-02-15 NOTE — Progress Notes (Signed)
Patient ID: Bryan Knight, male    DOB: Dec 24, 1945, 70 y.o.   MRN: [id number]  PCP: Kieu Quiggle, PA-C  Subjective:   Chief Complaint  Patient presents with  . Follow-up  . Diabetes  . feet swelling    pt "unsure as to when started"    HPI Presents for evaluation of diabetes and HTN. Swelling started in the legs yesterday.  "it feels really tight". It began in the left leg last night and this morning he noticed it in his right a little after waking. Denies pain, chest pain, shortness of breath, dyspnea, change in diet, bowel or bladder habits.  He had knee surgery recently and continues to have pain. He is getting ready to have injections and follows up with ortho next week.  Nephrologist recently adjusted clonidine, and patient states no adverse affects noted except dry mouth, which is tolerable.    Review of Systems  Constitutional: Negative.   HENT: Negative.   Eyes: Negative.   Respiratory: Negative for cough, choking, chest tightness, shortness of breath and wheezing.   Cardiovascular: Positive for leg swelling. Negative for chest pain and palpitations.  Gastrointestinal: Negative for nausea, vomiting, diarrhea and constipation.  Endocrine: Negative.   Genitourinary: Negative for dysuria, urgency, frequency and hematuria.  Musculoskeletal: Positive for arthralgias (knee). Negative for myalgias, back pain, gait problem and neck pain.  Skin: Negative.   Allergic/Immunologic: Negative.   Neurological: Negative for dizziness, weakness, light-headedness and headaches.  Hematological: Negative for adenopathy. Does not bruise/bleed easily.  Psychiatric/Behavioral: Negative for suicidal ideas, sleep disturbance, self-injury, dysphoric mood and decreased concentration. The patient is not nervous/anxious.        Patient Active Problem List   Diagnosis Date Noted  . Knee pain, left 10/19/2015  . Diastolic dysfunction 123456  . Microalbuminuria 04/02/2015  .  Depression 04/02/2015  . Foreign body in esophagus 10/24/2013  . B12 deficiency 01/11/2012  . Hypertension 10/20/2011  . Diabetes mellitus type II, controlled ([place]) 10/20/2011  . Hypothyroid 10/20/2011  . Rosacea 10/20/2011     Prior to Admission medications   Medication Sig Start Date End Date Taking? Authorizing Provider  amLODipine (NORVASC) 10 MG tablet TAKE 1 TABLET (10 MG TOTAL) BY MOUTH DAILY 11/16/15  Yes Mahki Spikes, PA-C  aspirin 81 MG tablet Take 81 mg by mouth daily.    Yes Historical Provider, MD  atorvastatin (LIPITOR) 20 MG tablet Take 1 tablet (20 mg total) by mouth daily. 08/11/15  Yes Einar Nolasco, PA-C  cloNIDine (CATAPRES) 0.1 MG tablet Take 1 tablet (0.1 mg total) by mouth every evening. 10/19/15  Yes Pixie Casino, MD  DULoxetine (CYMBALTA) 30 MG capsule Take 1 capsule (30 mg total) by mouth daily. 02/04/16  Yes Yandel Zeiner, PA-C  glucose blood (ONETOUCH VERIO) test strip Test blood sugar once daily. Dx: E11.65 08/30/15  Yes Maye Parkinson, PA-C  latanoprost (XALATAN) 0.005 % ophthalmic solution Place 1 drop into both eyes at bedtime.   Yes Historical Provider, MD  levothyroxine (SYNTHROID, LEVOTHROID) 75 MCG tablet Take 1 tablet (75 mcg total) by mouth daily. 05/06/15  Yes Belva Koziel, PA-C  lisinopril (PRINIVIL,ZESTRIL) 40 MG tablet Take 40 mg by mouth daily. 09/30/15  Yes Historical Provider, MD  metFORMIN (GLUCOPHAGE) 500 MG tablet Take 1 tablet (500 mg total) by mouth 2 (two) times daily with a meal. 08/11/15  Yes Leveta Wahab, PA-C  metoprolol succinate (TOPROL XL) 100 MG 24 hr tablet Take 1 tablet (100 mg total) by mouth daily. Take with  or immediately following a meal. 08/10/15  Yes Daishaun Ayre, PA-C  montelukast (SINGULAIR) 10 MG tablet TAKE ONE TABLET BY MOUTH NIGHTLY AT BEDTIME 12/19/15  Yes Nadja Lina, PA-C  ONETOUCH DELICA LANCETS 99991111 MISC USE TO TEST ONCE DAILY AS DIRECTED**MAX DAYS SUPPLY 90** 12/30/15  Yes Briggette Najarian, PA-C  sitaGLIPtin  (JANUVIA) 100 MG tablet Take 1 tablet (100 mg total) by mouth daily. 04/06/15  Yes Caddie Randle, PA-C  chlorthalidone (HYGROTON) 50 MG tablet Take 50 mg by mouth daily. 12/03/15   Historical Provider, MD      Allergies  Allergen Reactions  . Citalopram     Ineffective  . Tussionex Pennkinetic Er [Hydrocod Polst-Cpm Polst Er] Other (See Comments)    Foggy headed  . Wellbutrin [Bupropion Hcl] Other (See Comments)    Sluggish        Objective:  Physical Exam  Constitutional: He is oriented to person, place, and time. He appears well-developed and well-nourished. He is active and cooperative. No distress.  BP 128/80 mmHg  Pulse 69  Temp(Src) 97.9 F (36.6 C) (Oral)  Resp 16  Ht 5' 8.5" (1.74 m)  Wt 173 lb 9.6 oz (78.744 kg)  BMI 26.01 kg/m2  SpO2 97%  HENT:  Head: Normocephalic and atraumatic.  Right Ear: Hearing normal.  Left Ear: Hearing normal.  Eyes: Conjunctivae are normal. No scleral icterus.  Neck: Normal range of motion. Neck supple. No thyromegaly present.  Cardiovascular: Normal rate, regular rhythm and normal heart sounds.   Pulses:      Radial pulses are 2+ on the right side, and 2+ on the left side.  1-2+ pitting edema of both lower extremities  Pulmonary/Chest: Effort normal and breath sounds normal.  Lymphadenopathy:       Head (right side): No tonsillar, no preauricular, no posterior auricular and no occipital adenopathy present.       Head (left side): No tonsillar, no preauricular, no posterior auricular and no occipital adenopathy present.    He has no cervical adenopathy.       Right: No supraclavicular adenopathy present.       Left: No supraclavicular adenopathy present.  Neurological: He is alert and oriented to person, place, and time. No sensory deficit.  Skin: Skin is warm, dry and intact. No rash noted. No cyanosis or erythema. Nails show no clubbing.  Psychiatric: He has a normal mood and affect. His speech is normal and behavior is normal.            Assessment & Plan:   1. Essential hypertension Controlled. Continue current treatment. - Comprehensive metabolic panel  2. Controlled type 2 diabetes mellitus without complication, without long-term current use of insulin ([place]) Await A1C. Healthy eating. Regular exercise as permitted by his knee pain. - HM Diabetes Foot Exam - Hemoglobin A1c - Comprehensive metabolic panel  3. Bilateral leg edema Await lab results. Suspect that he's recently consumed more salt. Increase fluids. Elevate legs. No evidence of CHF. - Comprehensive metabolic panel  4. Need for pneumococcal vaccination - Pneumococcal polysaccharide vaccine 23-valent greater than or equal to 2yo subcutaneous/IM  5. Screening for colon cancer Refuses screening again.   Fara Chute, PA-C Physician Assistant-Certified [facility]

## 2016-02-15 NOTE — Progress Notes (Signed)
Subjective:    Patient ID: Bryan Knight, male    DOB: [birth year], 70 y.o.   MRN: [id number]   Chief Complaint  Patient presents with  . Follow-up  . Diabetes  . feet swelling    pt "unsure as to when started"   HPI  Patient presents today for 3 month diabetes/hypertension management with complaints of edema in his lower extremities bilaterally, "it feels really tight".  It began in the left leg last night and this morning he noticed it in his right a little after waking.  Denies pain, chest pain, shortness of breath, dyspnea, change in diet, bowel or bladder habits.   Nephrologist recently adjusted clonidine, and patient states no adverse affects noted except dry mouth, which is tolerable.  Patient Active Problem List   Diagnosis Date Noted  . Knee pain, left 10/19/2015  . Diastolic dysfunction 123456  . Microalbuminuria 04/02/2015  . Depression 04/02/2015  . B12 deficiency 01/11/2012  . Hypertension 10/20/2011  . Diabetes mellitus type II, controlled ([place]) 10/20/2011  . Hypothyroid 10/20/2011  . Rosacea 10/20/2011   Current Outpatient Prescriptions on File Prior to Visit  Medication Sig Dispense Refill  . amLODipine (NORVASC) 10 MG tablet TAKE 1 TABLET (10 MG TOTAL) BY MOUTH DAILY 90 tablet 1  . aspirin 81 MG tablet Take 81 mg by mouth daily.     Marland Kitchen atorvastatin (LIPITOR) 20 MG tablet Take 1 tablet (20 mg total) by mouth daily. 90 tablet 3  . cloNIDine (CATAPRES) 0.1 MG tablet Take 1 tablet (0.1 mg total) by mouth every evening. 30 tablet 11  . DULoxetine (CYMBALTA) 30 MG capsule Take 1 capsule (30 mg total) by mouth daily. 90 capsule 0  . glucose blood (ONETOUCH VERIO) test strip Test blood sugar once daily. Dx: E11.65 100 each 3  . latanoprost (XALATAN) 0.005 % ophthalmic solution Place 1 drop into both eyes at bedtime.    Marland Kitchen levothyroxine (SYNTHROID, LEVOTHROID) 75 MCG tablet Take 1 tablet (75 mcg total) by mouth daily. 90 tablet 3  . lisinopril (PRINIVIL,ZESTRIL) 40  MG tablet Take 40 mg by mouth daily.  6  . metFORMIN (GLUCOPHAGE) 500 MG tablet Take 1 tablet (500 mg total) by mouth 2 (two) times daily with a meal. 180 tablet 3  . metoprolol succinate (TOPROL XL) 100 MG 24 hr tablet Take 1 tablet (100 mg total) by mouth daily. Take with or immediately following a meal. 90 tablet 3  . montelukast (SINGULAIR) 10 MG tablet TAKE ONE TABLET BY MOUTH NIGHTLY AT BEDTIME 90 tablet 0  . ONETOUCH DELICA LANCETS 99991111 MISC USE TO TEST ONCE DAILY AS DIRECTED**MAX DAYS SUPPLY 90** 100 each 0  . sitaGLIPtin (JANUVIA) 100 MG tablet Take 1 tablet (100 mg total) by mouth daily. 90 tablet 3   No current facility-administered medications on file prior to visit.   Allergies  Allergen Reactions  . Citalopram     Ineffective  . Tussionex Pennkinetic Er [Hydrocod Polst-Cpm Polst Er] Other (See Comments)    Foggy headed  . Wellbutrin [Bupropion Hcl] Other (See Comments)    Sluggish     Review of Systems  Constitutional: Negative for activity change, appetite change and fatigue.  HENT: Negative.   Respiratory: Negative for cough, chest tightness and shortness of breath.   Cardiovascular: Positive for leg swelling. Negative for chest pain.  Genitourinary: Negative for urgency, flank pain and difficulty urinating.  Neurological: Negative for weakness and light-headedness.       Objective:  Physical Exam  Constitutional: He is oriented to person, place, and time. He appears well-developed.  Cardiovascular: Normal rate, regular rhythm, normal heart sounds and intact distal pulses.  Exam reveals no gallop and no friction rub.   No murmur heard. Pulmonary/Chest: Effort normal and breath sounds normal.  Musculoskeletal: He exhibits edema.       Right ankle: He exhibits swelling.       Left ankle: He exhibits swelling.  2+ pitting edema bilaterally  Neurological: He is alert and oriented to person, place, and time.   Diabetic Foot Exam - Simple   Simple Foot Form    Diabetic Foot exam was performed with the following findings:  Yes 02/15/2016 11:19 AM  Visual Inspection  Sensation Testing  Pulse Check  Comments  Pedal pulses felt on foot exam         Assessment & Plan:  Next visit scheduled is annual physical exam and should be in 3-4 months. 1. Essential hypertension - CMP pending - Stable, indicated by patients 30 day diary providing 2xday BP readings -well controlled with medical management -patient followed by Nephrology  2. Controlled type 2 diabetes mellitus without complication, without long-term current use of insulin ([facility]) - Stable, well controlled with current regimen, indicated by patients 30 day diary providing daily morning glucose readings, under 200  - Diabetes Foot Exam performed, negative for abnormal findings related to diabetes - Hemoglobin A1c collected, results pending - CMP pending -if labs confirm diabetes well controlled will continue on current regimen, otherwise consider additional control medication.  3. Bilateral leg edema - CMP pending -Patient counseled to drinking lots of water and relax with his feet up to reduce swelling.  Avoid salt, alcohol and ensure medication is taken as prescribed.  If edema worsens or becomes painful contact office.     4. Need for pneumococcal vaccination -pneumovax 23 given  5. Screening for colon cancer -refer to gastroenterology for colonoscopy.    Malena Timpone P. Genevie Elman, PA-S

## 2016-02-15 NOTE — Patient Instructions (Addendum)
This afternoon, make sure you get plenty of water, and elevate your feet to help reduce the swelling.    IF you received an x-ray today, you will receive an invoice from [facility]. Please contact [facility] at [phone] with questions or concerns regarding your invoice.   IF you received labwork today, you will receive an invoice from [employer]. Please contact [facility] at [phone] with questions or concerns regarding your invoice.   Our billing staff will not be able to assist you with questions regarding bills from these companies.  You will be contacted with the lab results as soon as they are available. The fastest way to get your results is to activate your My Chart account. Instructions are located on the last page of this paperwork. If you have not heard from [place] regarding the results in 2 weeks, please contact this office.

## 2016-02-24 DIAGNOSIS — M1711 Unilateral primary osteoarthritis, right knee: Secondary | ICD-10-CM | POA: Diagnosis not present

## 2016-03-02 DIAGNOSIS — M1712 Unilateral primary osteoarthritis, left knee: Secondary | ICD-10-CM | POA: Diagnosis not present

## 2016-03-09 DIAGNOSIS — M1712 Unilateral primary osteoarthritis, left knee: Secondary | ICD-10-CM | POA: Diagnosis not present

## 2016-03-15 ENCOUNTER — Other Ambulatory Visit: Payer: Self-pay | Admitting: Physician Assistant

## 2016-03-16 NOTE — Telephone Encounter (Signed)
Bryan Knight, you just saw pt for check up, but this med not discussed. OK to RF?

## 2016-03-17 NOTE — Telephone Encounter (Signed)
Meds ordered this encounter  Medications  . montelukast (SINGULAIR) 10 MG tablet    Sig: TAKE ONE TABLET BY MOUTH NIGHTLY AT BEDTIME    Dispense:  90 tablet    Refill:  3

## 2016-03-23 ENCOUNTER — Encounter: Payer: Self-pay | Admitting: Podiatry

## 2016-03-23 ENCOUNTER — Ambulatory Visit (INDEPENDENT_AMBULATORY_CARE_PROVIDER_SITE_OTHER): Payer: Medicare Other | Admitting: Podiatry

## 2016-03-23 DIAGNOSIS — B351 Tinea unguium: Secondary | ICD-10-CM | POA: Diagnosis not present

## 2016-03-23 DIAGNOSIS — M79673 Pain in unspecified foot: Secondary | ICD-10-CM

## 2016-03-23 NOTE — Progress Notes (Signed)
Patient ID: Bryan Knight, male   DOB: 11/02/1945, 70 y.o.   MRN: [id number]  Complaint:  Visit Type: Patient returns to my office for continued preventative foot care services. Complaint: Patient states" my nails have grown long and thick and become painful to walk and wear shoes" Patient has been diagnosed with DM with no complications. He presents for preventative foot care services. No changes to ROS  Podiatric Exam: Vascular: dorsalis pedis and posterior tibial pulses are palpable bilateral. Capillary return is immediate. Temperature gradient is WNL. Skin turgor WNL  Sensorium: Normal Semmes Weinstein monofilament test. Normal tactile sensation bilaterally. Nail Exam: Pt has thick disfigured discolored nails with subungual debris noted bilateral entire nail hallux through fifth toenails Ulcer Exam: There is no evidence of ulcer or pre-ulcerative changes or infection. Orthopedic Exam: Muscle tone and strength are WNL. No limitations in general ROM. No crepitus or effusions noted. Foot type and digits show no abnormalities. Bony prominences are unremarkable. Skin: No Porokeratosis. No infection or ulcers  Diagnosis:  Tinea unguium, Pain in right toe, pain in left toes  Treatment & Plan Procedures and Treatment: Consent by patient was obtained for treatment procedures. The patient understood the discussion of treatment and procedures well. All questions were answered thoroughly reviewed. Debridement of mycotic and hypertrophic toenails, 1 through 5 bilateral and clearing of subungual debris. No ulceration, no infection noted.  Return Visit-Office Procedure: Patient instructed to return to the office for a follow up visit 3 months for continued evaluation and treatment.   Nahmir Zeidman DPM 

## 2016-03-27 DIAGNOSIS — R809 Proteinuria, unspecified: Secondary | ICD-10-CM | POA: Diagnosis not present

## 2016-03-27 DIAGNOSIS — N183 Chronic kidney disease, stage 3 (moderate): Secondary | ICD-10-CM | POA: Diagnosis not present

## 2016-03-27 DIAGNOSIS — E1122 Type 2 diabetes mellitus with diabetic chronic kidney disease: Secondary | ICD-10-CM | POA: Diagnosis not present

## 2016-03-27 DIAGNOSIS — I129 Hypertensive chronic kidney disease with stage 1 through stage 4 chronic kidney disease, or unspecified chronic kidney disease: Secondary | ICD-10-CM | POA: Diagnosis not present

## 2016-03-30 ENCOUNTER — Ambulatory Visit: Payer: Medicare Other | Admitting: Podiatry

## 2016-04-05 ENCOUNTER — Encounter: Payer: Self-pay | Admitting: Physician Assistant

## 2016-04-05 DIAGNOSIS — N183 Chronic kidney disease, stage 3 unspecified: Secondary | ICD-10-CM | POA: Insufficient documentation

## 2016-04-05 DIAGNOSIS — E1122 Type 2 diabetes mellitus with diabetic chronic kidney disease: Secondary | ICD-10-CM | POA: Insufficient documentation

## 2016-04-07 ENCOUNTER — Other Ambulatory Visit: Payer: Self-pay | Admitting: Physician Assistant

## 2016-04-17 DIAGNOSIS — N183 Chronic kidney disease, stage 3 (moderate): Secondary | ICD-10-CM | POA: Diagnosis not present

## 2016-04-21 ENCOUNTER — Other Ambulatory Visit: Payer: Self-pay | Admitting: Physician Assistant

## 2016-04-21 ENCOUNTER — Ambulatory Visit (INDEPENDENT_AMBULATORY_CARE_PROVIDER_SITE_OTHER): Payer: Medicare Other | Admitting: Internal Medicine

## 2016-04-21 VITALS — BP 118/82 | HR 68 | Ht 69.0 in | Wt 162.0 lb

## 2016-04-21 DIAGNOSIS — I519 Heart disease, unspecified: Secondary | ICD-10-CM | POA: Diagnosis not present

## 2016-04-21 DIAGNOSIS — R0602 Shortness of breath: Secondary | ICD-10-CM | POA: Insufficient documentation

## 2016-04-21 DIAGNOSIS — I5189 Other ill-defined heart diseases: Secondary | ICD-10-CM

## 2016-04-21 DIAGNOSIS — E118 Type 2 diabetes mellitus with unspecified complications: Secondary | ICD-10-CM

## 2016-04-21 DIAGNOSIS — I10 Essential (primary) hypertension: Secondary | ICD-10-CM

## 2016-04-21 DIAGNOSIS — N183 Chronic kidney disease, stage 3 (moderate): Secondary | ICD-10-CM

## 2016-04-21 DIAGNOSIS — E1122 Type 2 diabetes mellitus with diabetic chronic kidney disease: Secondary | ICD-10-CM

## 2016-04-21 NOTE — Progress Notes (Signed)
OFFICE NOTE  Chief Complaint:  Uncontrolled hypertension  Primary Care Physician: Harrison Mons, PA-C  HPI:  Bryan Knight is a pleasant 70 year old male who is coming referred to me for evaluation of uncontrolled hypertension. He says he's had high blood pressure for over 40 years and has had difficulty controlling it at times. He's been on a number of different medications however recently his blood pressures been difficult to control. In addition he was noted to have some chronic kidney disease with a creatinine of 1.6. Nephrology but cannot get an appointment for 6 months. Currently he is on a regimen of amlodipine 10 mg daily, lisinopril HCTZ 20/12.5 mg and metoprolol succinate 100 mg daily (which was just started by his PCP. He did bring a list of blood pressure readings at home which indicate recent blood pressures in the 170-200/90-100 range. He denies any symptoms with this. Currently his blood pressure is 158/96. He has had some shortness of breath with exertion but no chest pain. He's never had an ischemia workup. He has had 2 renal ultrasounds, last in 2012 which showed normal renal size. These were not renal artery Dopplers and therefore, I cannot comment on whether he could have renal artery stenosis.  Bryan Knight returns today for follow-up. Blood pressure still remains poorly controlled. He brought a list of medications and says that the only thing that seems to reduce the blood pressure acutely is clonidine. He was prescribed this by his nephrologist to use as needed for blood pressures over 180. In general his blood pressures are almost over 180 most of time. He reports his shortness of breath is improved somewhat. His echo showed diastolic dysfunction but normal systolic function. Stress testing was negative. He had renal Dopplers which showed normal renal size and parenchyma however renal blood flow was not assessed. I do not however suspect he has renal artery stenosis. He's had  long-standing hypertension which has been difficult to control. He says many years ago a Catapres patch was helpful but he had horrible dry mouth and was taken off of it.  04/21/16  Bryan Knight was seen today back in follow-up. Blood pressure is at goal today at 118/82. He had recent adjustment of his medicines, in fact he says that Dr. Mercy Moore decreased his lisinopril to 20 mg twice a day and stopped his clonidine. He brought in a list of blood pressures and they were fairly elevated. Apparently they've been at goal at his office and was at goal here, which makes me believe that his home blood pressure cuff may be inaccurate. I would like to set him up for a blood pressure check with our hypertension pharmacist and I've advised him to bring his blood pressure cuff to check for its calibration against our equipment.   PMHx:  Past Medical History:  Diagnosis Date  . Cataract   . Depression   . Diabetes mellitus   . Glaucoma   . Hearing loss   . Hyperlipidemia   . Hypertension   . Rosacea   . Thyroid disease     Past Surgical History:  Procedure Laterality Date  . EYE SURGERY    . KNEE ARTHROSCOPY WITH MEDIAL MENISECTOMY Left 11/10/15  . TONSILLECTOMY     over 50 yrs ago    FAMHx:  Family History  Problem Relation Age of Onset  . Hypertension Mother   . Liver cancer Mother 77  . Hypertension Son     SOCHx:   reports that he  quit smoking about 48 years ago. His smoking use included Cigarettes. He has a 2.00 pack-year smoking history. He has never used smokeless tobacco. He reports that he does not drink alcohol or use drugs.  ALLERGIES:  Allergies  Allergen Reactions  . Citalopram     Ineffective  . Tussionex Pennkinetic Er [Hydrocod Polst-Cpm Polst Er] Other (See Comments)    Foggy headed  . Wellbutrin [Bupropion Hcl] Other (See Comments)    Sluggish     ROS: Pertinent items noted in HPI and remainder of comprehensive ROS otherwise negative.  HOME MEDS: Current  Outpatient Prescriptions  Medication Sig Dispense Refill  . amLODipine (NORVASC) 10 MG tablet TAKE 1 TABLET (10 MG TOTAL) BY MOUTH DAILY 90 tablet 1  . aspirin 81 MG tablet Take 81 mg by mouth daily.     Marland Kitchen atorvastatin (LIPITOR) 20 MG tablet Take 1 tablet (20 mg total) by mouth daily. 90 tablet 3  . chlorthalidone (HYGROTON) 50 MG tablet Take 50 mg by mouth daily.  6  . DULoxetine (CYMBALTA) 30 MG capsule Take 1 capsule (30 mg total) by mouth daily. 90 capsule 0  . glucose blood (ONETOUCH VERIO) test strip Test blood sugar once daily. Dx: E11.65 100 each 3  . JANUVIA 100 MG tablet TAKE 1 TABLET DAILY 90 tablet 1  . latanoprost (XALATAN) 0.005 % ophthalmic solution Place 1 drop into both eyes at bedtime.    Marland Kitchen levothyroxine (SYNTHROID, LEVOTHROID) 75 MCG tablet Take 1 tablet (75 mcg total) by mouth daily. 90 tablet 3  . lisinopril (PRINIVIL,ZESTRIL) 20 MG tablet Take 20 mg by mouth 2 (two) times daily.    . metFORMIN (GLUCOPHAGE) 500 MG tablet Take 1 tablet (500 mg total) by mouth 2 (two) times daily with a meal. 180 tablet 3  . metoprolol succinate (TOPROL XL) 100 MG 24 hr tablet Take 1 tablet (100 mg total) by mouth daily. Take with or immediately following a meal. 90 tablet 3  . montelukast (SINGULAIR) 10 MG tablet TAKE ONE TABLET BY MOUTH NIGHTLY AT BEDTIME 90 tablet 3  . ONETOUCH DELICA LANCETS 99991111 MISC USE TO TEST ONCE DAILY AS DIRECTED**MAX DAYS SUPPLY 90** 100 each 0  . hydrALAZINE (APRESOLINE) 50 MG tablet Take 1 tablet by mouth 2 (two) times daily.     No current facility-administered medications for this visit.     LABS/IMAGING: No results found for this or any previous visit (from the past 48 hour(s)). No results found.  WEIGHTS: Wt Readings from Last 3 Encounters:  04/21/16 162 lb (73.5 kg)  02/15/16 173 lb 9.6 oz (78.7 kg)  11/16/15 167 lb (75.8 kg)    VITALS: BP 118/82 (BP Location: Left Arm, Patient Position: Sitting, Cuff Size: Normal)   Pulse 68   Ht 5\' 9"  (1.753 m)    Wt 162 lb (73.5 kg)   BMI 23.92 kg/m   EXAM: General appearance: alert and no distress Lungs: clear to auscultation bilaterally Heart: regular rate and rhythm, S1, S2 normal, no murmur, click, rub or gallop Extremities: extremities normal, atraumatic, no cyanosis or edema Neurologic: Grossly normal  EKG: Normal sinus rhythm at 69, left anterior fascicular block, moderate voltage criteria for LVH  ASSESSMENT: 1. Hypertension - now at goal 2. Low risk nuclear stress test-echo with mild diastolic dysfunction ([id number]) 3. CKD 2 4. DM2 5. Dyslipidemia  PLAN: 1.   Bryan Knight seems to have better control over his hypertension based on our office visits and his visits to his nephrologist, however  he reports blood pressures which are recorded at home to be 20-30 mmHg higher. I will arrange for a follow-up blood pressure check and he is encouraged to bring his blood pressure cuff for validation as it may be giving erroneous number. Plan to continue his current medications at this time. I'm happy see him back annually for follow-up or sooner as necessary.  Pixie Casino, MD, [facility] Attending Cardiologist [place] [facility] 04/21/2016, 9:12 AM

## 2016-04-21 NOTE — Patient Instructions (Signed)
Your physician recommends that you schedule a follow-up appointment with clinical pharmacist for a blood pressure check in a few weeks -- please bring your BP cuff and BP readings to this appointment   Your physician wants you to follow-up in: 1 year with Dr. Debara Pickett. You will receive a reminder letter in the mail two months in advance. If you don't receive a letter, please call our office to schedule the follow-up appointment.

## 2016-05-01 DIAGNOSIS — N183 Chronic kidney disease, stage 3 (moderate): Secondary | ICD-10-CM | POA: Diagnosis not present

## 2016-05-05 ENCOUNTER — Other Ambulatory Visit: Payer: Self-pay | Admitting: Physician Assistant

## 2016-05-05 DIAGNOSIS — E039 Hypothyroidism, unspecified: Secondary | ICD-10-CM

## 2016-05-08 ENCOUNTER — Encounter: Payer: Self-pay | Admitting: Pharmacist

## 2016-05-08 ENCOUNTER — Ambulatory Visit (INDEPENDENT_AMBULATORY_CARE_PROVIDER_SITE_OTHER): Payer: Medicare Other | Admitting: Pharmacist

## 2016-05-08 VITALS — BP 166/91 | HR 98 | Ht 69.0 in | Wt 164.2 lb

## 2016-05-08 DIAGNOSIS — I10 Essential (primary) hypertension: Secondary | ICD-10-CM

## 2016-05-08 NOTE — Progress Notes (Signed)
Patient ID: Bryan Knight                 DOB: 06-08-1946                      MRN: [id number]     HPI: Bryan Knight is a 70 y.o. male patient of Dr. Debara Pickett with PMH below who presents today for hypertension evaluation.  He was recently seen by primary care who discontinued his clonidine and changed his lisinopril because his blood pressures were normal-low in their office. At his last visit with Dr. Debara Pickett his pressure also measured with normal limits. However, his pressures at home have been running in the 160s-180s per his home cuff. He brought his home cuff and logs with him today to be calibrated.   He denies symptoms of high or low blood pressure.   Cardiac Hx: Htn, DM, CKD, microalbuminuria, diastolic dysfunction  Current HTN meds:  Amlodipine 10mg  qhs Chlorthalidone 50mg  qam Hydralazine 50mg  BID Lisinopril 20mg  BID Toprol XL 100mg  Qam  Previously tried:  Clonidine - stopped because pressures normalized  BP goal: <150/90  Family History: mother had hypertension but passed away from liver cancer. Father passed away from old age at 34.   Social History: no smokeless tobacco. Quit smoking in 1959 when he finished with the [employer]. He drinks about 4 beers per year.   Diet: He admits to too much sugar and sodium. He never adds salt to his food. He also quit coffee the day he got out of the [employer]. He endorses 4 - 16 oz bottles of diet pepsi.   Exercise: no exercise. He previously has walked 10 miles daily at the recommendation of his providers.   Home BP readings:  Has an omron home arm cuff, which he states he cannot recall how old it is. He believes it is rather old though.  His home pressures run 160s-180s/80s-90s   Wt Readings from Last 3 Encounters:  04/21/16 162 lb (73.5 kg)  02/15/16 173 lb 9.6 oz (78.7 kg)  11/16/15 167 lb (75.8 kg)   BP Readings from Last 3 Encounters:  04/21/16 118/82  02/15/16 128/80  11/16/15 (!) 160/82   Pulse Readings from Last 3 Encounters:    04/21/16 68  02/15/16 69  11/16/15 68    Renal function: CrCl cannot be calculated (Unknown ideal weight.).  Past Medical History:  Diagnosis Date  . Cataract   . Depression   . Diabetes mellitus   . Glaucoma   . Hearing loss   . Hyperlipidemia   . Hypertension   . Rosacea   . Thyroid disease     Current Outpatient Prescriptions on File Prior to Visit  Medication Sig Dispense Refill  . amLODipine (NORVASC) 10 MG tablet TAKE 1 TABLET (10 MG TOTAL) BY MOUTH DAILY 90 tablet 1  . aspirin 81 MG tablet Take 81 mg by mouth daily.     Marland Kitchen atorvastatin (LIPITOR) 20 MG tablet Take 1 tablet (20 mg total) by mouth daily. 90 tablet 3  . chlorthalidone (HYGROTON) 50 MG tablet Take 50 mg by mouth daily.  6  . DULoxetine (CYMBALTA) 30 MG capsule Take 1 capsule (30 mg total) by mouth daily. 90 capsule 0  . glucose blood (ONETOUCH VERIO) test strip Test blood sugar once daily. Dx: E11.65 100 each 3  . hydrALAZINE (APRESOLINE) 50 MG tablet Take 1 tablet by mouth 2 (two) times daily.    Marland Kitchen JANUVIA 100 MG tablet  TAKE 1 TABLET DAILY 90 tablet 1  . latanoprost (XALATAN) 0.005 % ophthalmic solution Place 1 drop into both eyes at bedtime.    Marland Kitchen levothyroxine (SYNTHROID, LEVOTHROID) 75 MCG tablet Take 1 tablet (75 mcg total) by mouth daily. 90 tablet 3  . lisinopril (PRINIVIL,ZESTRIL) 20 MG tablet Take 20 mg by mouth 2 (two) times daily.    . metFORMIN (GLUCOPHAGE) 500 MG tablet Take 1 tablet (500 mg total) by mouth 2 (two) times daily with a meal. 180 tablet 3  . metoprolol succinate (TOPROL XL) 100 MG 24 hr tablet Take 1 tablet (100 mg total) by mouth daily. Take with or immediately following a meal. 90 tablet 3  . montelukast (SINGULAIR) 10 MG tablet TAKE ONE TABLET BY MOUTH NIGHTLY AT BEDTIME 90 tablet 3  . ONETOUCH DELICA LANCETS 99991111 MISC USE TO TEST ONCE DAILY AS DIRECTED**MAX DAYS SUPPLY 90** 100 each 0   No current facility-administered medications on file prior to visit.     Allergies   Allergen Reactions  . Citalopram     Ineffective  . Tussionex Pennkinetic Er [Hydrocod Polst-Cpm Polst Er] Other (See Comments)    Foggy headed  . Wellbutrin [Bupropion Hcl] Other (See Comments)    Sluggish     There were no vitals taken for this visit.   Assessment/Plan: Hypertension: His pressure is at goal today upon manual pressure check. It appears his cuff is measuring a significant amount higher than the manual reading. His pressures at his last several doctors visits have been at goal. Have recommended that he purchase a new cuff. He will follow-up as directed with Dr. Debara Pickett and his primary care and hypertension clinic as needed.   Thank you, Lelan Pons. Patterson Hammersmith, [facility]  05/08/2016 10:29 AM

## 2016-05-08 NOTE — Patient Instructions (Addendum)
Return for a a follow up appointment with Dr. Debara Pickett as scheduled  Check your blood pressure at home daily (if able) and keep record of the readings.  Take your BP meds as follows: Continue same medications as prescribed.   Purchase new blood pressure cuff if you can.   Bring all of your meds, your BP cuff and your record of home blood pressures to your next appointment.  Exercise as you're able, try to walk approximately 30 minutes per day.  Keep salt intake to a minimum, especially watch canned and prepared boxed foods.  Eat more fresh fruits and vegetables and fewer canned items.  Avoid eating in fast food restaurants.    HOW TO TAKE YOUR BLOOD PRESSURE: . Rest 5 minutes before taking your blood pressure. .  Don't smoke or drink caffeinated beverages for at least 30 minutes before. . Take your blood pressure before (not after) you eat. . Sit comfortably with your back supported and both feet on the floor (don't cross your legs). . Elevate your arm to heart level on a table or a desk. . Use the proper sized cuff. It should fit smoothly and snugly around your bare upper arm. There should be enough room to slip a fingertip under the cuff. The bottom edge of the cuff should be 1 inch above the crease of the elbow. . Ideally, take 3 measurements at one sitting and record the average.

## 2016-05-10 ENCOUNTER — Other Ambulatory Visit: Payer: Self-pay | Admitting: Physician Assistant

## 2016-05-26 IMAGING — NM NM MISC PROCEDURE
6 series · 36 of 36 positions shown · non-contrast
Comparison: none

[Series 1: wbr_r-proj_st wbr rest · 6.40mm/px · 6 of 64 frames shown]
[frame 6/64]
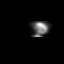
[frame 16/64]
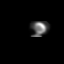
[frame 27/64]
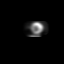
[frame 38/64]
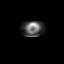
[frame 48/64]
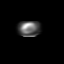
[frame 59/64]
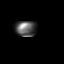

[Series 1: wbr rest · 6.40mm/px · 6 of 64 frames shown]
[frame 6/64]
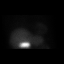
[frame 16/64]
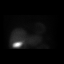
[frame 27/64]
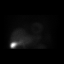
[frame 38/64]
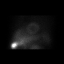
[frame 48/64]
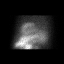
[frame 59/64]
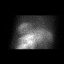

[Series 2: wbr stress-gsp · 6.40mm/px · 6 of 512 frames shown]
[frame 43/512]
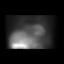
[frame 128/512]
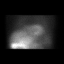
[frame 214/512]
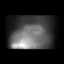
[frame 299/512]
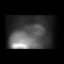
[frame 384/512]
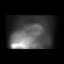
[frame 470/512]
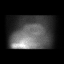

[Series 2: wbr_s-proj_st wbr stress-gsp · 6.40mm/px · 6 of 512 frames shown]
[frame 43/512]
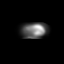
[frame 128/512]
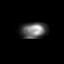
[frame 214/512]
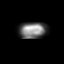
[frame 299/512]
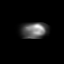
[frame 384/512]
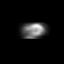
[frame 470/512]
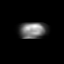

[Series 3: wbr_s-proj_st wbr stress-sum-em · 6.40mm/px · 6 of 64 frames shown]
[frame 6/64]
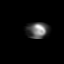
[frame 16/64]
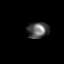
[frame 27/64]
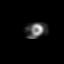
[frame 38/64]
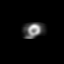
[frame 48/64]
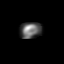
[frame 59/64]
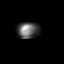

[Series 3: wbr stress-sum-em · 6.40mm/px · 6 of 64 frames shown]
[frame 6/64]
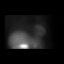
[frame 16/64]
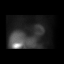
[frame 27/64]
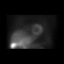
[frame 38/64]
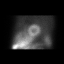
[frame 48/64]
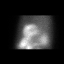
[frame 59/64]
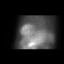

[36 of 36 positions shown; findings below may reference images not displayed]

Canned report from images found in remote index.

Refer to host system for actual result text.

## 2016-05-29 DIAGNOSIS — E1122 Type 2 diabetes mellitus with diabetic chronic kidney disease: Secondary | ICD-10-CM | POA: Diagnosis not present

## 2016-05-29 DIAGNOSIS — N183 Chronic kidney disease, stage 3 (moderate): Secondary | ICD-10-CM | POA: Diagnosis not present

## 2016-05-29 DIAGNOSIS — R809 Proteinuria, unspecified: Secondary | ICD-10-CM | POA: Diagnosis not present

## 2016-05-29 DIAGNOSIS — I129 Hypertensive chronic kidney disease with stage 1 through stage 4 chronic kidney disease, or unspecified chronic kidney disease: Secondary | ICD-10-CM | POA: Diagnosis not present

## 2016-06-02 ENCOUNTER — Ambulatory Visit (INDEPENDENT_AMBULATORY_CARE_PROVIDER_SITE_OTHER): Payer: Medicare Other | Admitting: Physician Assistant

## 2016-06-02 VITALS — BP 140/76 | HR 67 | Temp 97.5°F | Resp 18 | Ht 69.0 in | Wt 166.0 lb

## 2016-06-02 DIAGNOSIS — I10 Essential (primary) hypertension: Secondary | ICD-10-CM | POA: Diagnosis not present

## 2016-06-02 DIAGNOSIS — M79662 Pain in left lower leg: Secondary | ICD-10-CM

## 2016-06-02 DIAGNOSIS — T148XXA Other injury of unspecified body region, initial encounter: Secondary | ICD-10-CM

## 2016-06-02 DIAGNOSIS — N183 Chronic kidney disease, stage 3 (moderate): Secondary | ICD-10-CM

## 2016-06-02 DIAGNOSIS — E1122 Type 2 diabetes mellitus with diabetic chronic kidney disease: Secondary | ICD-10-CM | POA: Diagnosis not present

## 2016-06-02 DIAGNOSIS — R809 Proteinuria, unspecified: Secondary | ICD-10-CM

## 2016-06-02 DIAGNOSIS — E118 Type 2 diabetes mellitus with unspecified complications: Secondary | ICD-10-CM

## 2016-06-02 NOTE — Progress Notes (Signed)
Patient ID: Bryan Knight, male    DOB: June 09, 1946, 70 y.o.   MRN: [id number]  PCP: Harrison Mons, PA-C  Subjective:   Chief Complaint  Patient presents with  . Leg Pain    left leg lump    HPI Presents for evaluation of a lump on the lower left leg that developed after he injured it doing some yard work 1 week ago.  He was pruning and banged it against the ladder or a branch. He sustained a laceration and several bruises. Initially he was concerned that the laceration may have become infected, but it has been improving. The areas remain sore, and the bruises, while lightening in color, are getting bigger/spreading inferiorly. His wife encouraged him to come in for evaluation.  BP continues to be elevated. Cardiologist, nephrologist and clinical pharmacist all working to get control.   Glucose also remains unchanged, running higher that desired. Treatment options are limited due to his renal function.  He was due for his annual eye exam in June, but it was rescheduled by the doctor. His eye specialist has not been available x several months, reportedly "out sick." He'd like recommendations for other practices.  Review of Systems  Constitutional: Negative for activity change, appetite change, fatigue and unexpected weight change.  HENT: Negative for congestion, dental problem, ear pain, hearing loss, mouth sores, postnasal drip, rhinorrhea, sneezing, sore throat, tinnitus and trouble swallowing.   Eyes: Negative for photophobia, pain, redness and visual disturbance.  Respiratory: Negative for cough, chest tightness and shortness of breath.   Cardiovascular: Negative for chest pain, palpitations and leg swelling.  Gastrointestinal: Negative for abdominal pain, blood in stool, constipation, diarrhea, nausea and vomiting.  Genitourinary: Negative for dysuria, frequency, hematuria and urgency.  Musculoskeletal: Negative for arthralgias, gait problem, myalgias and neck stiffness.  Skin:  Positive for wound (LEFT lower medial leg). Negative for rash.  Neurological: Negative for dizziness, speech difficulty, weakness, light-headedness, numbness and headaches.  Hematological: Negative for adenopathy.  Psychiatric/Behavioral: Negative for confusion and sleep disturbance. The patient is not nervous/anxious.        Patient Active Problem List   Diagnosis Date Noted  . Shortness of breath 04/21/2016  . CKD stage 3 due to type 2 diabetes mellitus ([place]) 04/05/2016  . Knee pain, left 10/19/2015  . Diastolic dysfunction 76/28/3151  . Microalbuminuria 04/02/2015  . Depression 04/02/2015  . B12 deficiency 01/11/2012  . Hypertension 10/20/2011  . Type 2 diabetes mellitus with unspecified complications ([place]) 76/16/0737  . Hypothyroid 10/20/2011  . Rosacea 10/20/2011     Prior to Admission medications   Medication Sig Start Date End Date Taking? Authorizing Provider  amLODipine (NORVASC) 10 MG tablet TAKE 1 TABLET (10 MG TOTAL) BY MOUTH DAILY 05/10/16  Yes Maronda Caison, PA-C  aspirin 81 MG tablet Take 81 mg by mouth daily.    Yes Historical Provider, MD  atorvastatin (LIPITOR) 20 MG tablet Take 1 tablet (20 mg total) by mouth daily. 08/11/15  Yes Herley Bernardini, PA-C  chlorthalidone (HYGROTON) 50 MG tablet Take 50 mg by mouth daily. 12/03/15  Yes Historical Provider, MD  DULoxetine (CYMBALTA) 30 MG capsule TAKE 1 CAPSULE DAILY       (DISCUSS AT NEXT CHECK UP  FOR FURTHER REFILLS) 05/09/16  Yes Arleta Ostrum, PA-C  glucose blood (ONETOUCH VERIO) test strip Test blood sugar once daily. Dx: E11.65 08/30/15  Yes Forrest Demuro, PA-C  hydrALAZINE (APRESOLINE) 50 MG tablet Take 100 mg by mouth 2 (two) times daily.  03/27/16  Yes Historical Provider, MD  JANUVIA 100 MG tablet TAKE 1 TABLET DAILY 04/07/16  Yes Tanay Massiah, PA-C  latanoprost (XALATAN) 0.005 % ophthalmic solution Place 1 drop into both eyes at bedtime.   Yes Historical Provider, MD  levothyroxine (SYNTHROID, LEVOTHROID)  75 MCG tablet TAKE 1 TABLET (75 MCG TOTAL) BY MOUTH DAILY. 05/09/16  Yes Maelani Yarbro, PA-C  lisinopril (PRINIVIL,ZESTRIL) 20 MG tablet Take 20 mg by mouth 2 (two) times daily.   Yes Historical Provider, MD  metFORMIN (GLUCOPHAGE) 500 MG tablet Take 1 tablet (500 mg total) by mouth 2 (two) times daily with a meal. 08/11/15  Yes Fayrene Towner, PA-C  metoprolol succinate (TOPROL XL) 100 MG 24 hr tablet Take 1 tablet (100 mg total) by mouth daily. Take with or immediately following a meal. 08/10/15  Yes Camya Haydon, PA-C  montelukast (SINGULAIR) 10 MG tablet TAKE ONE TABLET BY MOUTH NIGHTLY AT BEDTIME 03/17/16  Yes Abdikadir Fohl, PA-C  ONETOUCH DELICA LANCETS 19Q MISC USE TO TEST ONCE DAILY AS DIRECTED**MAX DAYS SUPPLY 90** 04/23/16  Yes Mirriam Vadala, PA-C     Allergies  Allergen Reactions  . Citalopram     Ineffective  . Tussionex Pennkinetic Er [Hydrocod Polst-Cpm Polst Er] Other (See Comments)    Foggy headed  . Wellbutrin [Bupropion Hcl] Other (See Comments)    Sluggish        Objective:  Physical Exam  Constitutional: He is oriented to person, place, and time. He appears well-developed and well-nourished. He is active and cooperative. No distress.  BP 140/76 (BP Location: Right Arm, Patient Position: Sitting, Cuff Size: Small)   Pulse 67   Temp 97.5 F (36.4 C) (Oral)   Resp 18   Ht 5\' 9"  (1.753 m)   Wt 166 lb (75.3 kg)   SpO2 99%   BMI 24.51 kg/m   HENT:  Head: Normocephalic and atraumatic.  Right Ear: Hearing normal.  Left Ear: Hearing normal.  Eyes: Conjunctivae are normal. No scleral icterus.  Neck: Normal range of motion. Neck supple. No thyromegaly present.  Cardiovascular: Normal rate, regular rhythm and normal heart sounds.   Pulses:      Radial pulses are 2+ on the right side, and 2+ on the left side.  Pulmonary/Chest: Effort normal and breath sounds normal.  Lymphadenopathy:       Head (right side): No tonsillar, no preauricular, no posterior auricular and  no occipital adenopathy present.       Head (left side): No tonsillar, no preauricular, no posterior auricular and no occipital adenopathy present.    He has no cervical adenopathy.       Right: No supraclavicular adenopathy present.       Left: No supraclavicular adenopathy present.  Neurological: He is alert and oriented to person, place, and time. No sensory deficit.  Skin: Skin is warm and dry. Ecchymosis (2 2-3 cm areas of ecchymosis, appear to be resolving. Mildly tender. Both with palpable hematoma.) and laceration noted. No rash noted. No cyanosis or erythema. Nails show no clubbing.     Psychiatric: He has a normal mood and affect. His speech is normal and behavior is normal.           Assessment & Plan:   1. Contusion Resolving contusion and laceration. Local wound care. Anticipatory guidance.  2. Type 2 diabetes mellitus with unspecified complications ([place]) Await labs. Adjust regimen as indicated by results. - Comprehensive metabolic panel - Hemoglobin A1c - Microalbumin, urine - Lipid panel  3. CKD  stage 3 due to type 2 diabetes mellitus ([place]) Await lab results. - Comprehensive metabolic panel  4. Essential hypertension Continue working with cardiology and nephrology. - Comprehensive metabolic panel  5. Microalbuminuria Await lab results. - Microalbumin, urine    Return in about 3 months (around 09/01/2016) for re-evaluation.    Fara Chute, PA-C Physician Assistant-Certified [facility]

## 2016-06-02 NOTE — Patient Instructions (Addendum)
The contusion may take weeks/months to resolve completely.  There is no evidence of infection today. If you have INCREASING pain or redness, let me know.     [facility]  [address] [place], [place] [address]  Phone: [phone]  [facility] [place], [place], [place] [address]  Phone: [phone]  [facility] [address] [place], [place] [address]  Phone: [phone]      IF you received an x-ray today, you will receive an invoice from [facility]. Please contact [facility] at [phone] with questions or concerns regarding your invoice.   IF you received labwork today, you will receive an invoice from [employer]. Please contact [facility] at [phone] with questions or concerns regarding your invoice.   Our billing staff will not be able to assist you with questions regarding bills from these companies.  You will be contacted with the lab results as soon as they are available. The fastest way to get your results is to activate your My Chart account. Instructions are located on the last page of this paperwork. If you have not heard from [place] regarding the results in 2 weeks, please contact this office.

## 2016-06-03 LAB — COMPREHENSIVE METABOLIC PANEL
ALBUMIN: 4.7 g/dL (ref 3.6–5.1)
ALK PHOS: 62 U/L (ref 40–115)
ALT: 22 U/L (ref 9–46)
AST: 18 U/L (ref 10–35)
BILIRUBIN TOTAL: 0.6 mg/dL (ref 0.2–1.2)
BUN: 27 mg/dL — ABNORMAL HIGH (ref 7–25)
CALCIUM: 10.1 mg/dL (ref 8.6–10.3)
CO2: 25 mmol/L (ref 20–31)
CREATININE: 2.04 mg/dL — AB (ref 0.70–1.18)
Chloride: 95 mmol/L — ABNORMAL LOW (ref 98–110)
Glucose, Bld: 242 mg/dL — ABNORMAL HIGH (ref 65–99)
Potassium: 3.3 mmol/L — ABNORMAL LOW (ref 3.5–5.3)
SODIUM: 137 mmol/L (ref 135–146)
TOTAL PROTEIN: 7.3 g/dL (ref 6.1–8.1)

## 2016-06-03 LAB — LIPID PANEL
CHOL/HDL RATIO: 2.9 ratio (ref <=5.0)
Cholesterol: 153 mg/dL (ref 125–200)
HDL: 52 mg/dL (ref >=40)
LDL CALC: 45 mg/dL (ref <130)
Triglycerides: 282 mg/dL — ABNORMAL HIGH (ref <150)
VLDL: 56 mg/dL — ABNORMAL HIGH (ref <30)

## 2016-06-03 LAB — HEMOGLOBIN A1C
Hgb A1c MFr Bld: 8.2 % — ABNORMAL HIGH (ref <5.7)
MEAN PLASMA GLUCOSE: 189 mg/dL

## 2016-06-03 LAB — MICROALBUMIN, URINE: MICROALB UR: 15 mg/dL

## 2016-06-03 MED ORDER — BASAGLAR KWIKPEN 100 UNIT/ML ~~LOC~~ SOPN: 10.0000 [IU] | PEN_INJECTOR | Freq: Every day | SUBCUTANEOUS | 1 refills | Status: DC

## 2016-06-04 ENCOUNTER — Encounter: Payer: Self-pay | Admitting: Physician Assistant

## 2016-06-05 MED ORDER — PEN NEEDLES 30G X 8 MM MISC: 1.0000 | Freq: Every day | 3 refills | Status: DC

## 2016-06-05 NOTE — Telephone Encounter (Signed)
Patient notified via My Chart.  Meds ordered this encounter  Medications  . Insulin Pen Needle (PEN NEEDLES) 30G X 8 MM MISC    Sig: 1 Device by Does not apply route daily.    Dispense:  100 each    Refill:  3    Order Specific Question:   Supervising Provider    Answer:   Brigitte Pulse, EVA N [[id number]]

## 2016-06-08 ENCOUNTER — Telehealth: Payer: Self-pay

## 2016-06-08 NOTE — Telephone Encounter (Signed)
Denny Peon would like to know if she could get pt's Insulin Pen Needle (PEN NEEDLES) 30G X 8 MM MISC [[id number]] in a 29G

## 2016-06-09 ENCOUNTER — Ambulatory Visit (INDEPENDENT_AMBULATORY_CARE_PROVIDER_SITE_OTHER): Payer: Medicare Other | Admitting: Physician Assistant

## 2016-06-09 VITALS — BP 128/72 | HR 65 | Temp 98.0°F | Resp 16 | Ht 69.0 in | Wt 165.0 lb

## 2016-06-09 DIAGNOSIS — E118 Type 2 diabetes mellitus with unspecified complications: Secondary | ICD-10-CM

## 2016-06-09 MED ORDER — ALCOHOL PREP PADS: MEDICATED_PAD | 99 refills | Status: AC

## 2016-06-09 NOTE — Progress Notes (Signed)
Chief Complaint  Patient presents with  . Diabetes    instruction on insulin use    History of Present Illness: Patient presents for instruction on insulin pen use.  He has diabetes, management of which is limited by his chronic kidney disease. His diabetes control has worsened over the past several visits and I recommended that he initiate long-acting insulin.   Allergies  Allergen Reactions  . Citalopram     Ineffective  . Tussionex Pennkinetic Er [Hydrocod Polst-Cpm Polst Er] Other (See Comments)    Foggy headed  . Wellbutrin [Bupropion Hcl] Other (See Comments)    Sluggish     Prior to Admission medications   Medication Sig Start Date End Date Taking? Authorizing Provider  amLODipine (NORVASC) 10 MG tablet TAKE 1 TABLET (10 MG TOTAL) BY MOUTH DAILY 05/10/16  Yes Rondale Nies, PA-C  aspirin 81 MG tablet Take 81 mg by mouth daily.    Yes Historical Provider, MD  atorvastatin (LIPITOR) 20 MG tablet Take 1 tablet (20 mg total) by mouth daily. 08/11/15  Yes Nima Bamburg, PA-C  chlorthalidone (HYGROTON) 50 MG tablet Take 50 mg by mouth daily. 12/03/15  Yes Historical Provider, MD  DULoxetine (CYMBALTA) 30 MG capsule TAKE 1 CAPSULE DAILY       (DISCUSS AT NEXT CHECK UP  FOR FURTHER REFILLS) 05/09/16  Yes Traylen Eckels, PA-C  glucose blood (ONETOUCH VERIO) test strip Test blood sugar once daily. Dx: E11.65 08/30/15  Yes Zylon Creamer, PA-C  hydrALAZINE (APRESOLINE) 50 MG tablet Take 100 mg by mouth 2 (two) times daily.  03/27/16  Yes Historical Provider, MD  Insulin Glargine (BASAGLAR KWIKPEN) 100 UNIT/ML SOPN Inject 0.1 mLs (10 Units total) into the skin at bedtime. 06/03/16  Yes Yamel Bale, PA-C  Insulin Pen Needle (PEN NEEDLES) 30G X 8 MM MISC 1 Device by Does not apply route daily. 06/05/16  Yes Fredrica Capano, PA-C  JANUVIA 100 MG tablet TAKE 1 TABLET DAILY 04/07/16  Yes Lj Miyamoto, PA-C  latanoprost (XALATAN) 0.005 % ophthalmic solution Place 1 drop into both eyes at  bedtime.   Yes Historical Provider, MD  levothyroxine (SYNTHROID, LEVOTHROID) 75 MCG tablet TAKE 1 TABLET (75 MCG TOTAL) BY MOUTH DAILY. 05/09/16  Yes Cobie Marcoux, PA-C  lisinopril (PRINIVIL,ZESTRIL) 20 MG tablet Take 20 mg by mouth 2 (two) times daily.   Yes Historical Provider, MD  metFORMIN (GLUCOPHAGE) 500 MG tablet Take 1 tablet (500 mg total) by mouth 2 (two) times daily with a meal. 08/11/15  Yes Jazzmyne Rasnick, PA-C  metoprolol succinate (TOPROL XL) 100 MG 24 hr tablet Take 1 tablet (100 mg total) by mouth daily. Take with or immediately following a meal. 08/10/15  Yes Sailor Haughn, PA-C  montelukast (SINGULAIR) 10 MG tablet TAKE ONE TABLET BY MOUTH NIGHTLY AT BEDTIME 03/17/16  Yes Hester Joslin, PA-C  ONETOUCH DELICA LANCETS 95J MISC USE TO TEST ONCE DAILY AS DIRECTED**MAX DAYS SUPPLY 90** 04/23/16  Yes Merlen Gurry, PA-C  Alcohol Swabs (ALCOHOL PREP) PADS Use daily for insulin injection 06/09/16   Harrison Mons, PA-C    Patient Active Problem List   Diagnosis Date Noted  . Shortness of breath 04/21/2016  . CKD stage 3 due to type 2 diabetes mellitus ([place]) 04/05/2016  . Knee pain, left 10/19/2015  . Diastolic dysfunction 88/41/6606  . Microalbuminuria 04/02/2015  . Depression 04/02/2015  . B12 deficiency 01/11/2012  . Hypertension 10/20/2011  . Type 2 diabetes mellitus with unspecified complications ([place]) 30/16/0109  . Hypothyroid 10/20/2011  .  Rosacea 10/20/2011     Physical Exam BP 128/72 (BP Location: Right Arm, Cuff Size: Normal)   Pulse 65   Temp 98 F (36.7 C)   Resp 16   Ht 5\' 9"  (1.753 m)   Wt 165 lb (74.8 kg)   SpO2 98%   BMI 24.37 kg/m  WDWNWM. A&O x 3. Instructed on self-injecting. Observed patient self-inject.   ASSESSMENT & PLAN:  1. Type 2 diabetes mellitus with unspecified complications ([place]) - For home use only DME Other see comment - Alcohol Swabs (ALCOHOL PREP) PADS; Use daily for insulin injection  Dispense: 100 each; Refill:  prn   Fara Chute, PA-C Physician Assistant-Certified [facility]

## 2016-06-09 NOTE — Patient Instructions (Addendum)
     IF you received an x-ray today, you will receive an invoice from [facility]. Please contact [facility] at [phone] with questions or concerns regarding your invoice.   IF you received labwork today, you will receive an invoice from [facility]. Please contact [facility] at [phone] with questions or concerns regarding your invoice.   Our billing staff will not be able to assist you with questions regarding bills from these companies.  You will be contacted with the lab results as soon as they are available. The fastest way to get your results is to activate your My Chart account. Instructions are located on the last page of this paperwork. If you have not heard from us regarding the results in 2 weeks, please contact this office.      

## 2016-06-14 ENCOUNTER — Encounter: Payer: Self-pay | Admitting: Physician Assistant

## 2016-06-15 ENCOUNTER — Encounter: Payer: Self-pay | Admitting: Podiatry

## 2016-06-15 ENCOUNTER — Ambulatory Visit (INDEPENDENT_AMBULATORY_CARE_PROVIDER_SITE_OTHER): Payer: Medicare Other | Admitting: Podiatry

## 2016-06-15 VITALS — BP 140/69 | HR 61 | Resp 14

## 2016-06-15 DIAGNOSIS — B351 Tinea unguium: Secondary | ICD-10-CM | POA: Diagnosis not present

## 2016-06-15 DIAGNOSIS — M79673 Pain in unspecified foot: Secondary | ICD-10-CM | POA: Diagnosis not present

## 2016-06-15 NOTE — Progress Notes (Signed)
Patient ID: BERN FARE, male   DOB: 12/24/1945, 70 y.o.   MRN: [id number]  Complaint:  Visit Type: Patient returns to my office for continued preventative foot care services. Complaint: Patient states" my nails have grown long and thick and become painful to walk and wear shoes" Patient has been diagnosed with DM with no complications. He presents for preventative foot care services. No changes to ROS  Podiatric Exam: Vascular: dorsalis pedis and posterior tibial pulses are palpable bilateral. Capillary return is immediate. Temperature gradient is WNL. Skin turgor WNL  Sensorium: Normal Semmes Weinstein monofilament test. Normal tactile sensation bilaterally. Nail Exam: Pt has thick disfigured discolored nails with subungual debris noted bilateral entire nail hallux through fifth toenails Ulcer Exam: There is no evidence of ulcer or pre-ulcerative changes or infection. Orthopedic Exam: Muscle tone and strength are WNL. No limitations in general ROM. No crepitus or effusions noted. Foot type and digits show no abnormalities. Bony prominences are unremarkable. Skin: No Porokeratosis. No infection or ulcers  Diagnosis:  Tinea unguium, Pain in right toe, pain in left toes  Treatment & Plan Procedures and Treatment: Consent by patient was obtained for treatment procedures. The patient understood the discussion of treatment and procedures well. All questions were answered thoroughly reviewed. Debridement of mycotic and hypertrophic toenails, 1 through 5 bilateral and clearing of subungual debris. No ulceration, no infection noted.  Return Visit-Office Procedure: Patient instructed to return to the office for a follow up visit 3 months for continued evaluation and treatment.   Gardiner Barefoot DPM

## 2016-06-20 ENCOUNTER — Ambulatory Visit (INDEPENDENT_AMBULATORY_CARE_PROVIDER_SITE_OTHER): Payer: Medicare Other | Admitting: Physician Assistant

## 2016-06-20 ENCOUNTER — Encounter: Payer: Self-pay | Admitting: Physician Assistant

## 2016-06-20 VITALS — BP 122/70 | HR 64 | Temp 98.7°F | Resp 18 | Ht 69.0 in | Wt 164.0 lb

## 2016-06-20 DIAGNOSIS — Z136 Encounter for screening for cardiovascular disorders: Secondary | ICD-10-CM

## 2016-06-20 DIAGNOSIS — Z0000 Encounter for general adult medical examination without abnormal findings: Secondary | ICD-10-CM | POA: Diagnosis not present

## 2016-06-20 DIAGNOSIS — Z9189 Other specified personal risk factors, not elsewhere classified: Secondary | ICD-10-CM | POA: Diagnosis not present

## 2016-06-20 NOTE — Patient Instructions (Addendum)
Please put your end-of-life wishes in writing, in the form of a Living Will and [place]. Consider reading Being Mortal by Eda Keys, MD.  Plan to fast for your next visit, and we will update the labs then.    IF you received an x-ray today, you will receive an invoice from [facility]. Please contact [facility] at [phone] with questions or concerns regarding your invoice.   IF you received labwork today, you will receive an invoice from [employer]. Please contact [facility] at [phone] with questions or concerns regarding your invoice.   Our billing staff will not be able to assist you with questions regarding bills from these companies.  You will be contacted with the lab results as soon as they are available. The fastest way to get your results is to activate your My Chart account. Instructions are located on the last page of this paperwork. If you have not heard from [place] regarding the results in 2 weeks, please contact this office.

## 2016-06-20 NOTE — Progress Notes (Signed)
Presents today for [employer] Visit-Subsequent.   Date of last exam: no wellness visit since at least 10/20/2011. Last visit with me 06/14/2016.  Interpreter used for this visit? no  Patient Care Team: Harrison Mons, PA-C as PCP - General (Family Medicine) Fleet Contras, MD as Consulting Physician (Nephrology)   Other items to address today: none   Cancer Screening: Cervical: n/a Breast: n/a Colon: yes, repeat 11/9765  Prostate: uncertain   Other Screening: Last screening for diabetes: patient has diabetes Last lipid screening: patient has hyperlipidemia   ADVANCE DIRECTIVES: Discussed: yes On File: no Materials Provided: yes   Immunization status: up to date and documented.  Home Environment: lives with his wife and oldest son in a 2-story home. They live in the 1st floor.    Patient Active Problem List   Diagnosis Date Noted  . Shortness of breath 04/21/2016  . CKD stage 3 due to type 2 diabetes mellitus ([place]) 04/05/2016  . Knee pain, left 10/19/2015  . Diastolic dysfunction 34/19/3790  . Microalbuminuria 04/02/2015  . Depression 04/02/2015  . B12 deficiency 01/11/2012  . Hypertension 10/20/2011  . Type 2 diabetes mellitus with unspecified complications 24/05/7352  . Hypothyroid 10/20/2011  . Rosacea 10/20/2011     Past Medical History:  Diagnosis Date  . Cataract   . Depression   . Diabetes mellitus   . Glaucoma   . Hearing loss   . Hyperlipidemia   . Hypertension   . Rosacea   . Thyroid disease      Past Surgical History:  Procedure Laterality Date  . EYE SURGERY    . KNEE ARTHROSCOPY WITH MEDIAL MENISECTOMY Left 11/10/15  . TONSILLECTOMY     over 50 yrs ago     Family History  Problem Relation Age of Onset  . Hypertension Mother   . Liver cancer Mother 48  . Hypertension Son      Social History   Social History  . Marital status: Married    Spouse name: Arbie Cookey  . Number of children: 2  . Years of education:  79   Occupational History  . Retired    Social History Main Topics  . Smoking status: Former Smoker    Packs/day: 0.50    Years: 4.00    Types: Cigarettes    Quit date: 10/18/1967  . Smokeless tobacco: Never Used  . Alcohol use No  . Drug use: No  . Sexual activity: Yes    Birth control/ protection: None     Comment: married   Other Topics Concern  . Not on file   Social History Narrative   Epworth Sleepiness Scale = 5 (08/11/2015)      Lives with wife Arbie Cookey and oldest son Rodman Key. Have 3 grown sons, and 2 grandsons. Is a retired Passenger transport manager.     Allergies  Allergen Reactions  . Citalopram     Ineffective  . Tussionex Pennkinetic Er [Hydrocod Polst-Cpm Polst Er] Other (See Comments)    Foggy headed  . Wellbutrin [Bupropion Hcl] Other (See Comments)    Sluggish      Prior to Admission medications   Medication Sig Start Date End Date Taking? Authorizing Provider  Alcohol Swabs (ALCOHOL PREP) PADS Use daily for insulin injection 06/09/16  Yes Areana Kosanke, PA-C  amLODipine (NORVASC) 10 MG tablet TAKE 1 TABLET (10 MG TOTAL) BY MOUTH DAILY 05/10/16  Yes Alivea Gladson, PA-C  aspirin 81 MG tablet Take 81 mg by mouth daily.    Yes Historical Provider,  MD  atorvastatin (LIPITOR) 20 MG tablet Take 1 tablet (20 mg total) by mouth daily. 08/11/15  Yes Rosezetta Balderston, PA-C  chlorthalidone (HYGROTON) 50 MG tablet Take 50 mg by mouth daily. 12/03/15  Yes Historical Provider, MD  DULoxetine (CYMBALTA) 30 MG capsule TAKE 1 CAPSULE DAILY       (DISCUSS AT NEXT CHECK UP  FOR FURTHER REFILLS) 05/09/16  Yes Gagandeep Kossman, PA-C  glucose blood (ONETOUCH VERIO) test strip Test blood sugar once daily. Dx: E11.65 08/30/15  Yes Payden Bonus, PA-C  Insulin Glargine (BASAGLAR KWIKPEN) 100 UNIT/ML SOPN Inject 0.1 mLs (10 Units total) into the skin at bedtime. 06/03/16  Yes Florencio Hollibaugh, PA-C  Insulin Pen Needle (PEN NEEDLES) 30G X 8 MM MISC 1 Device by Does not apply route daily. 06/05/16  Yes Zema Lizardo, PA-C  JANUVIA 100 MG tablet TAKE 1 TABLET DAILY 04/07/16  Yes Doyle Kunath, PA-C  latanoprost (XALATAN) 0.005 % ophthalmic solution Place 1 drop into both eyes at bedtime.   Yes Historical Provider, MD  levothyroxine (SYNTHROID, LEVOTHROID) 75 MCG tablet TAKE 1 TABLET (75 MCG TOTAL) BY MOUTH DAILY. 05/09/16  Yes Ziaire Hagos, PA-C  metFORMIN (GLUCOPHAGE) 500 MG tablet Take 1 tablet (500 mg total) by mouth 2 (two) times daily with a meal. 08/11/15  Yes Shala Baumbach, PA-C  metoprolol succinate (TOPROL XL) 100 MG 24 hr tablet Take 1 tablet (100 mg total) by mouth daily. Take with or immediately following a meal. 08/10/15  Yes Mackinzie Vuncannon, PA-C  montelukast (SINGULAIR) 10 MG tablet TAKE ONE TABLET BY MOUTH NIGHTLY AT BEDTIME 03/17/16  Yes Rashauna Tep, PA-C  ONETOUCH DELICA LANCETS 78L MISC USE TO TEST ONCE DAILY AS DIRECTED**MAX DAYS SUPPLY 90** 04/23/16  Yes Uday Jantz, PA-C  EASY TOUCH PEN NEEDLES 29G X 12MM MISC USE AS DIRECTED. 06/08/16   Historical Provider, MD  hydrALAZINE (APRESOLINE) 100 MG tablet  05/29/16   Historical Provider, MD  lisinopril (PRINIVIL,ZESTRIL) 40 MG tablet  04/17/16   Historical Provider, MD     Depression screen [facility] 2/9 06/20/2016 06/09/2016 06/02/2016 02/15/2016 11/16/2015  Decreased Interest 0 0 0 0 0  Down, Depressed, Hopeless 0 0 0 0 0  PHQ - 2 Score 0 0 0 0 0     Fall Risk  06/20/2016 06/09/2016 06/02/2016 02/15/2016 11/16/2015  Falls in the past year? No No No No No     Functional Status Survey: Is the patient deaf or have difficulty hearing?: No Does the patient have difficulty seeing, even when wearing glasses/contacts?: No Does the patient have difficulty concentrating, remembering, or making decisions?: No Does the patient have difficulty walking or climbing stairs?: No Does the patient have difficulty dressing or bathing?: No Does the patient have difficulty doing errands alone such as visiting a doctor's office or shopping?:  No     Evaluation of Cognitive Function: Mood/affect: good, though pessimistic, appropriate affect Appearance: well-groomed Family Member/caregiver input: none    PHYSICAL EXAM: BP 122/70 (BP Location: Right Arm, Patient Position: Sitting, Cuff Size: Small)   Pulse 64   Temp 98.7 F (37.1 C) (Oral)   Resp 18   Ht 5\' 9"  (1.753 m)   Wt 164 lb (74.4 kg)   SpO2 99%   BMI 24.22 kg/m    Wt Readings from Last 3 Encounters:  06/20/16 164 lb (74.4 kg)  06/09/16 165 lb (74.8 kg)  06/02/16 166 lb (75.3 kg)       Visual Acuity Screening   Right eye Left  eye Both eyes  Without correction:     With correction: 20/15 20/20 20/20       Physical Exam  Constitutional: He is oriented to person, place, and time. He appears well-developed and well-nourished. No distress.  HENT:  Head: Normocephalic and atraumatic.  Eyes: Conjunctivae are normal. No scleral icterus.  Neck: Neck supple. No thyromegaly present.  Cardiovascular: Normal rate, regular rhythm and normal heart sounds.   Respiratory: Effort normal and breath sounds normal.  Lymphadenopathy:    He has no cervical adenopathy.  Neurological: He is alert and oriented to person, place, and time.  Skin: Skin is warm and dry.  Psychiatric: He has a normal mood and affect. His speech is normal and behavior is normal.     Education/Counseling: yes diet and exercise yes prevention of chronic diseases yes smoking/tobacco cessation yes review "Covered Medicare Preventive Services"    ASSESSMENT/PLAN:  1. Medicare annual wellness visit, subsequent Age appropriate anticipatory guidance provided.  2. Screening for AAA (abdominal aortic aneurysm) - US ABDOMINAL AORTA SCREENING AAA; Future  3. At risk for osteoporosis Unclear what coverage is for this, what specific diagnoses are needed to cover DEXA. Will look in to that.   Fara Chute, PA-C Physician Assistant-Certified [facility]

## 2016-07-04 ENCOUNTER — Ambulatory Visit
Admission: RE | Admit: 2016-07-04 | Discharge: 2016-07-04 | Disposition: A | Discharge status: Home / Self Care | Payer: Medicare Other | Source: Ambulatory Visit | Attending: Physician Assistant | Admitting: Physician Assistant

## 2016-07-04 DIAGNOSIS — I77811 Abdominal aortic ectasia: Secondary | ICD-10-CM | POA: Diagnosis not present

## 2016-07-04 DIAGNOSIS — Z136 Encounter for screening for cardiovascular disorders: Secondary | ICD-10-CM | POA: Diagnosis not present

## 2016-07-05 ENCOUNTER — Encounter: Payer: Self-pay | Admitting: Physician Assistant

## 2016-07-05 DIAGNOSIS — I77811 Abdominal aortic ectasia: Secondary | ICD-10-CM | POA: Insufficient documentation

## 2016-07-31 DIAGNOSIS — R809 Proteinuria, unspecified: Secondary | ICD-10-CM | POA: Diagnosis not present

## 2016-07-31 DIAGNOSIS — N183 Chronic kidney disease, stage 3 (moderate): Secondary | ICD-10-CM | POA: Diagnosis not present

## 2016-07-31 DIAGNOSIS — E1122 Type 2 diabetes mellitus with diabetic chronic kidney disease: Secondary | ICD-10-CM | POA: Diagnosis not present

## 2016-07-31 DIAGNOSIS — I129 Hypertensive chronic kidney disease with stage 1 through stage 4 chronic kidney disease, or unspecified chronic kidney disease: Secondary | ICD-10-CM | POA: Diagnosis not present

## 2016-08-01 ENCOUNTER — Other Ambulatory Visit: Payer: Self-pay | Admitting: Physician Assistant

## 2016-08-03 ENCOUNTER — Other Ambulatory Visit: Payer: Self-pay | Admitting: Physician Assistant

## 2016-08-03 DIAGNOSIS — I10 Essential (primary) hypertension: Secondary | ICD-10-CM

## 2016-08-03 DIAGNOSIS — I517 Cardiomegaly: Secondary | ICD-10-CM

## 2016-08-03 DIAGNOSIS — E785 Hyperlipidemia, unspecified: Secondary | ICD-10-CM

## 2016-08-03 MED ORDER — ONETOUCH DELICA LANCETS 33G MISC: 3 refills | Status: DC

## 2016-08-04 ENCOUNTER — Other Ambulatory Visit: Payer: Self-pay | Admitting: Physician Assistant

## 2016-08-04 DIAGNOSIS — E039 Hypothyroidism, unspecified: Secondary | ICD-10-CM

## 2016-08-06 ENCOUNTER — Other Ambulatory Visit: Payer: Self-pay | Admitting: Physician Assistant

## 2016-08-14 ENCOUNTER — Other Ambulatory Visit: Payer: Self-pay | Admitting: Physician Assistant

## 2016-08-15 ENCOUNTER — Telehealth: Payer: Self-pay | Admitting: Family Medicine

## 2016-08-15 ENCOUNTER — Other Ambulatory Visit: Payer: Self-pay | Admitting: Physician Assistant

## 2016-08-15 DIAGNOSIS — IMO0001 Reserved for inherently not codable concepts without codable children: Secondary | ICD-10-CM

## 2016-08-15 DIAGNOSIS — E1165 Type 2 diabetes mellitus with hyperglycemia: Principal | ICD-10-CM

## 2016-08-15 NOTE — Telephone Encounter (Signed)
Pt states that someone called him today I didn't see where anyone has called

## 2016-08-16 ENCOUNTER — Other Ambulatory Visit: Payer: Self-pay | Admitting: Physician Assistant

## 2016-08-16 NOTE — Telephone Encounter (Signed)
06/2016 last ov 05/2016 last labs

## 2016-08-17 MED ORDER — AMLODIPINE BESYLATE 10 MG PO TABS: 10.0000 mg | ORAL_TABLET | Freq: Every day | ORAL | 3 refills | Status: DC

## 2016-08-29 ENCOUNTER — Other Ambulatory Visit: Payer: Self-pay | Admitting: Physician Assistant

## 2016-09-02 ENCOUNTER — Other Ambulatory Visit: Payer: Self-pay | Admitting: Physician Assistant

## 2016-09-05 ENCOUNTER — Other Ambulatory Visit: Payer: Self-pay | Admitting: Physician Assistant

## 2016-09-05 MED ORDER — DULOXETINE HCL 30 MG PO CPEP: 30.0000 mg | ORAL_CAPSULE | Freq: Every day | ORAL | 0 refills | Status: DC

## 2016-09-05 NOTE — Telephone Encounter (Signed)
09/26/16 next appt.

## 2016-09-05 NOTE — Telephone Encounter (Signed)
Needs ov Last seen 06/2016 Last refill 07/2016 and states needs ov

## 2016-09-08 ENCOUNTER — Other Ambulatory Visit: Payer: Self-pay | Admitting: Physician Assistant

## 2016-09-08 DIAGNOSIS — E039 Hypothyroidism, unspecified: Secondary | ICD-10-CM

## 2016-09-14 ENCOUNTER — Encounter: Payer: Self-pay | Admitting: Podiatry

## 2016-09-14 ENCOUNTER — Ambulatory Visit (INDEPENDENT_AMBULATORY_CARE_PROVIDER_SITE_OTHER): Payer: Medicare Other | Admitting: Podiatry

## 2016-09-14 VITALS — Ht 69.0 in | Wt 164.0 lb

## 2016-09-14 DIAGNOSIS — E118 Type 2 diabetes mellitus with unspecified complications: Secondary | ICD-10-CM

## 2016-09-14 DIAGNOSIS — B351 Tinea unguium: Secondary | ICD-10-CM

## 2016-09-14 DIAGNOSIS — Z794 Long term (current) use of insulin: Secondary | ICD-10-CM

## 2016-09-14 NOTE — Progress Notes (Signed)
Patient ID: QUANELL LOUGHNEY, male   DOB: [birth year], 70 y.o.   MRN: [id number]  Complaint:  Visit Type: Patient returns to my office for continued preventative foot care services. Complaint: Patient states" my nails have grown long and thick and become painful to walk and wear shoes" Patient has been diagnosed with DM with no complications. He presents for preventative foot care services. No changes to ROS  Podiatric Exam: Vascular: dorsalis pedis and posterior tibial pulses are palpable bilateral. Capillary return is immediate. Temperature gradient is WNL. Skin turgor WNL  Sensorium: Normal Semmes Weinstein monofilament test. Normal tactile sensation bilaterally. Nail Exam: Pt has thick disfigured discolored nails with subungual debris noted bilateral entire nail hallux through fifth toenails Ulcer Exam: There is no evidence of ulcer or pre-ulcerative changes or infection. Orthopedic Exam: Muscle tone and strength are WNL. No limitations in general ROM. No crepitus or effusions noted. Foot type and digits show no abnormalities. Bony prominences are unremarkable. Skin: No Porokeratosis. No infection or ulcers  Diagnosis:  Tinea unguium, Pain in right toe, pain in left toes  Treatment & Plan Procedures and Treatment: Consent by patient was obtained for treatment procedures. The patient understood the discussion of treatment and procedures well. All questions were answered thoroughly reviewed. Debridement of mycotic and hypertrophic toenails, 1 through 5 bilateral and clearing of subungual debris. No ulceration, no infection noted.  Return Visit-Office Procedure: Patient instructed to return to the office for a follow up visit 3 months for continued evaluation and treatment.   Gardiner Barefoot DPM

## 2016-09-19 ENCOUNTER — Encounter: Payer: Self-pay | Admitting: Physician Assistant

## 2016-09-21 ENCOUNTER — Other Ambulatory Visit: Payer: Self-pay

## 2016-09-21 MED ORDER — EASY TOUCH PEN NEEDLES 29G X 12MM MISC: 1 refills | Status: DC

## 2016-09-26 ENCOUNTER — Ambulatory Visit (INDEPENDENT_AMBULATORY_CARE_PROVIDER_SITE_OTHER): Payer: Medicare Other | Admitting: Physician Assistant

## 2016-09-26 ENCOUNTER — Encounter: Payer: Self-pay | Admitting: Physician Assistant

## 2016-09-26 VITALS — BP 138/70 | HR 61 | Temp 97.8°F | Resp 18 | Ht 69.0 in | Wt 165.0 lb

## 2016-09-26 DIAGNOSIS — N183 Chronic kidney disease, stage 3 unspecified: Secondary | ICD-10-CM

## 2016-09-26 DIAGNOSIS — E118 Type 2 diabetes mellitus with unspecified complications: Secondary | ICD-10-CM

## 2016-09-26 DIAGNOSIS — I77811 Abdominal aortic ectasia: Secondary | ICD-10-CM

## 2016-09-26 DIAGNOSIS — E039 Hypothyroidism, unspecified: Secondary | ICD-10-CM

## 2016-09-26 DIAGNOSIS — I10 Essential (primary) hypertension: Secondary | ICD-10-CM | POA: Diagnosis not present

## 2016-09-26 DIAGNOSIS — R809 Proteinuria, unspecified: Secondary | ICD-10-CM | POA: Diagnosis not present

## 2016-09-26 DIAGNOSIS — E1122 Type 2 diabetes mellitus with diabetic chronic kidney disease: Secondary | ICD-10-CM

## 2016-09-26 DIAGNOSIS — E538 Deficiency of other specified B group vitamins: Secondary | ICD-10-CM | POA: Diagnosis not present

## 2016-09-26 NOTE — Patient Instructions (Addendum)
Please schedule an eye exam.  [facility]  [address], [place], [place] [address]  Phone: [phone]  [facility] [place], [place], [place] [address]  Phone: [phone]  [place] [address], [place], [place] [address]  Phone: [phone]      IF you received an x-ray today, you will receive an invoice from [facility]. Please contact [facility] at [phone] with questions or concerns regarding your invoice.   IF you received labwork today, you will receive an invoice from [place]. Please contact [facility] at [phone] with questions or concerns regarding your invoice.   Our billing staff will not be able to assist you with questions regarding bills from these companies.  You will be contacted with the lab results as soon as they are available. The fastest way to get your results is to activate your My Chart account. Instructions are located on the last page of this paperwork. If you have not heard from [place] regarding the results in 2 weeks, please contact this office.

## 2016-09-26 NOTE — Progress Notes (Signed)
Patient ID: Bryan Knight, male    DOB: 05-12-1946, 71 y.o.   MRN: [id number]  PCP: Harrison Mons, PA-C  Chief Complaint  Patient presents with  . Follow-up    MEDS, SUGAR LEVELS    Subjective:   Presents for evaluation of follow up of his HTN and diabetes and for medication refill.  Pt is a 71yo Caucasian male who presents for follow up of his hypertension and type 2 diabetes and for refill of his glucose test strips. His home BP readings have been running 035-597 systolic and his blood sugar values have been in the 100-120 range since the implementation of insulin (10 units) in September 2017. He has no complaints today. He denies heache, changes in vision, SOB, chest pain, abdominal pain, nausea, vomiting, tinging or numbness in his extremities, or dizziness. He denies any hypoglycemic episodes.   He also requests a refill or authorization of refill for his glucose test strips because he has been unable to get a refill from his pharmacy. However, his prescription from 08/29/16 has two refills left.   Review of Systems In addition to that mentioned in HPI above:     Patient Active Problem List   Diagnosis Date Noted  . Abdominal aortic ectasia ([place]) 07/05/2016  . Shortness of breath 04/21/2016  . CKD stage 3 due to type 2 diabetes mellitus ([place]) 04/05/2016  . Knee pain, left 10/19/2015  . Diastolic dysfunction 41/63/8453  . Microalbuminuria 04/02/2015  . Depression 04/02/2015  . B12 deficiency 01/11/2012  . Hypertension 10/20/2011  . Type 2 diabetes mellitus with complication ([place]) 64/68/0321  . Hypothyroid 10/20/2011  . Rosacea 10/20/2011     Prior to Admission medications   Medication Sig Start Date End Date Taking? Authorizing Provider  Alcohol Swabs (ALCOHOL PREP) PADS Use daily for insulin injection 06/09/16  Yes Chelle Jeffery, PA-C  amLODipine (NORVASC) 10 MG tablet Take 1 tablet (10 mg total) by mouth daily. 08/17/16  Yes Chelle Jeffery, PA-C  aspirin 81  MG tablet Take 81 mg by mouth daily.    Yes Historical Provider, MD  atorvastatin (LIPITOR) 20 MG tablet TAKE 1 TABLET (20 MG TOTAL) BY MOUTH DAILY. 08/04/16  Yes Chelle Jeffery, PA-C  chlorthalidone (HYGROTON) 50 MG tablet Take 50 mg by mouth daily. 12/03/15  Yes Historical Provider, MD  DULoxetine (CYMBALTA) 30 MG capsule FOR DIRECTIONS ON HOW TO   TAKE THIS MEDICINE, READ   THE ENCLOSED MEDICATION    INFORMATION FORM 09/06/16  Yes Chelle Jeffery, PA-C  EASY TOUCH PEN NEEDLES 29G X 12MM MISC USE AS DIRECTED with insulin pen 09/21/16  Yes Wendie Agreste, MD  hydrALAZINE (APRESOLINE) 100 MG tablet  05/29/16  Yes Historical Provider, MD  Insulin Glargine (BASAGLAR KWIKPEN) 100 UNIT/ML SOPN Inject 0.1 mLs (10 Units total) into the skin at bedtime. 06/03/16  Yes Chelle Jeffery, PA-C  JANUVIA 100 MG tablet TAKE 1 TABLET DAILY 04/07/16  Yes Chelle Jeffery, PA-C  latanoprost (XALATAN) 0.005 % ophthalmic solution Place 1 drop into both eyes at bedtime.   Yes Historical Provider, MD  levothyroxine (SYNTHROID, LEVOTHROID) 75 MCG tablet TAKE 1 TABLET (75 MCG TOTAL) BY MOUTH DAILY. 09/08/16  Yes Shawnee Knapp, MD  lisinopril (PRINIVIL,ZESTRIL) 40 MG tablet  04/17/16  Yes Historical Provider, MD  metFORMIN (GLUCOPHAGE) 500 MG tablet TAKE 1 TABLET (500 MG TOTAL) BY MOUTH 2 (TWO) TIMES DAILY WITH A MEAL. 08/16/16  Yes Chelle Jeffery, PA-C  metoprolol succinate (TOPROL-XL) 100 MG 24 hr  tablet TAKE 1 TABLET (100 MG TOTAL) BY MOUTH DAILY. TAKE WITH OR IMMEDIATELY FOLLOWING A MEAL. 08/04/16  Yes Chelle Jeffery, PA-C  montelukast (SINGULAIR) 10 MG tablet TAKE ONE TABLET BY MOUTH NIGHTLY AT BEDTIME 03/17/16  Yes Chelle Jeffery, PA-C  ONETOUCH DELICA LANCETS 13K MISC USE TO TEST ONCE DAILY AS DIRECTED**MAX DAYS SUPPLY 90** 08/03/16  Yes Chelle Jeffery, PA-C  ONETOUCH VERIO test strip TEST BLOOD SUGAR ONCE DAILY. 08/29/16  Yes Harrison Mons, PA-C     Allergies  Allergen Reactions  . Citalopram     Ineffective  . Tussionex  Pennkinetic Er [Hydrocod Polst-Cpm Polst Er] Other (See Comments)    Foggy headed  . Wellbutrin [Bupropion Hcl] Other (See Comments)    Sluggish        Objective:  Physical Exam Physical Exam  HEENT: PERRLA Pulm: Good respiratory effort. CTAB, no wheezes, rales, or rhonchi CV: RRR. No M/R/G Abd: Nontender, nondistended. + BS x 4 quadrants.         Assessment & Plan:   1. Type 2 diabetes mellitus with complication, without long-term current use of insulin (HCC) Continue medications as directed. Continue to monitor BG values at home. Glucose test strips refill should already be available. Pt advised to contact provider if he experiences further issues. - Comprehensive metabolic panel - Hemoglobin A1c - Lipid panel  2. Essential hypertension Continue medications as directed. Continue to monitor BP at home. - CBC with Differential/Platelet - Comprehensive metabolic panel - TSH  3. CKD stage 3 due to type 2 diabetes mellitus ([place]) Follow up with [facility] as directed. - Comprehensive metabolic panel - VITAMIN D 25 Hydroxy (Vit-D Deficiency, Fractures)  4. Hypothyroidism, unspecified type - TSH  5. Microalbuminuria  6. B12 deficiency - Vitamin B12  7. Abdominal aortic ectasia (HCC)   Bryan Nimrod, PA-S

## 2016-09-26 NOTE — Progress Notes (Signed)
Patient ID: Bryan Knight, male    DOB: 1945/09/24, 71 y.o.   MRN: [id number]  PCP: Harrison Mons, PA-C  Chief Complaint  Patient presents with  . Follow-up    MEDS, SUGAR LEVELS    Subjective:   Presents for evaluation of HTN and diabetes.  Home BP 193'X-902'I systolic.  Glucose 100-120's.  Tolerating insulin without difficulty. Needs test strips. He brought the label from his last fill, originally written in 2016. On 08/29/2016 I prescribed #100 with 3 refills.  No CP, SOB, HA, dizziness, nausea/vomiting or diarrhea.    Review of Systems  Constitutional: Negative for activity change, appetite change, fatigue and unexpected weight change.  HENT: Negative for congestion, dental problem, ear pain, hearing loss, mouth sores, postnasal drip, rhinorrhea, sneezing, sore throat, tinnitus and trouble swallowing.   Eyes: Negative for photophobia, pain, redness and visual disturbance.  Respiratory: Negative for cough, chest tightness and shortness of breath.   Cardiovascular: Negative for chest pain, palpitations and leg swelling.  Gastrointestinal: Negative for abdominal pain, blood in stool, constipation, diarrhea, nausea and vomiting.  Genitourinary: Negative for dysuria, frequency, hematuria and urgency.  Musculoskeletal: Negative for arthralgias, gait problem, myalgias and neck stiffness.  Skin: Negative for rash.  Neurological: Negative for dizziness, speech difficulty, weakness, light-headedness, numbness and headaches.  Hematological: Negative for adenopathy.  Psychiatric/Behavioral: Negative for confusion and sleep disturbance. The patient is not nervous/anxious.        Patient Active Problem List   Diagnosis Date Noted  . Abdominal aortic ectasia ([place]) 07/05/2016  . Shortness of breath 04/21/2016  . CKD stage 3 due to type 2 diabetes mellitus ([place]) 04/05/2016  . Knee pain, left 10/19/2015  . Diastolic dysfunction 09/73/5329  . Microalbuminuria 04/02/2015  .  Depression 04/02/2015  . B12 deficiency 01/11/2012  . Hypertension 10/20/2011  . Type 2 diabetes mellitus with unspecified complications 92/42/6834  . Hypothyroid 10/20/2011  . Rosacea 10/20/2011     Prior to Admission medications   Medication Sig Start Date End Date Taking? Authorizing Provider  Alcohol Swabs (ALCOHOL PREP) PADS Use daily for insulin injection 06/09/16  Yes Conception Doebler, PA-C  amLODipine (NORVASC) 10 MG tablet Take 1 tablet (10 mg total) by mouth daily. 08/17/16  Yes Sherel Fennell, PA-C  aspirin 81 MG tablet Take 81 mg by mouth daily.    Yes Historical Provider, MD  atorvastatin (LIPITOR) 20 MG tablet TAKE 1 TABLET (20 MG TOTAL) BY MOUTH DAILY. 08/04/16  Yes Shawnda Mauney, PA-C  chlorthalidone (HYGROTON) 50 MG tablet Take 50 mg by mouth daily. 12/03/15  Yes Historical Provider, MD  DULoxetine (CYMBALTA) 30 MG capsule FOR DIRECTIONS ON HOW TO   TAKE THIS MEDICINE, READ   THE ENCLOSED MEDICATION    INFORMATION FORM 09/06/16  Yes Devoiry Corriher, PA-C  EASY TOUCH PEN NEEDLES 29G X 12MM MISC USE AS DIRECTED with insulin pen 09/21/16  Yes Wendie Agreste, MD  hydrALAZINE (APRESOLINE) 100 MG tablet  05/29/16  Yes Historical Provider, MD  Insulin Glargine (BASAGLAR KWIKPEN) 100 UNIT/ML SOPN Inject 0.1 mLs (10 Units total) into the skin at bedtime. 06/03/16  Yes Kajah Santizo, PA-C  JANUVIA 100 MG tablet TAKE 1 TABLET DAILY 04/07/16  Yes Johnjoseph Rolfe, PA-C  latanoprost (XALATAN) 0.005 % ophthalmic solution Place 1 drop into both eyes at bedtime.   Yes Historical Provider, MD  levothyroxine (SYNTHROID, LEVOTHROID) 75 MCG tablet TAKE 1 TABLET (75 MCG TOTAL) BY MOUTH DAILY. 09/08/16  Yes Shawnee Knapp, MD  lisinopril (  PRINIVIL,ZESTRIL) 40 MG tablet  04/17/16  Yes Historical Provider, MD  metFORMIN (GLUCOPHAGE) 500 MG tablet TAKE 1 TABLET (500 MG TOTAL) BY MOUTH 2 (TWO) TIMES DAILY WITH A MEAL. 08/16/16  Yes Giselle Brutus, PA-C  metoprolol succinate (TOPROL-XL) 100 MG 24 hr tablet TAKE 1  TABLET (100 MG TOTAL) BY MOUTH DAILY. TAKE WITH OR IMMEDIATELY FOLLOWING A MEAL. 08/04/16  Yes Ramaya Guile, PA-C  montelukast (SINGULAIR) 10 MG tablet TAKE ONE TABLET BY MOUTH NIGHTLY AT BEDTIME 03/17/16  Yes Marqus Macphee, PA-C  ONETOUCH DELICA LANCETS 03T MISC USE TO TEST ONCE DAILY AS DIRECTED**MAX DAYS SUPPLY 90** 08/03/16  Yes Cass Edinger, PA-C  ONETOUCH VERIO test strip TEST BLOOD SUGAR ONCE DAILY. 08/29/16  Yes Harrison Mons, PA-C     Allergies  Allergen Reactions  . Citalopram     Ineffective  . Tussionex Pennkinetic Er [Hydrocod Polst-Cpm Polst Er] Other (See Comments)    Foggy headed  . Wellbutrin [Bupropion Hcl] Other (See Comments)    Sluggish        Objective:  Physical Exam  Constitutional: He is oriented to person, place, and time. He appears well-developed and well-nourished. He is active and cooperative. No distress.  BP 138/70 (BP Location: Right Arm, Patient Position: Sitting, Cuff Size: Small)   Pulse 61   Temp 97.8 F (36.6 C) (Oral)   Resp 18   Ht 5\' 9"  (1.753 m)   Wt 165 lb (74.8 kg)   SpO2 98%   BMI 24.37 kg/m   HENT:  Head: Normocephalic and atraumatic.  Right Ear: Hearing normal.  Left Ear: Hearing normal.  Eyes: Conjunctivae are normal. No scleral icterus.  Neck: Normal range of motion. Neck supple. No thyromegaly present.  Cardiovascular: Normal rate, regular rhythm and normal heart sounds.   Pulses:      Radial pulses are 2+ on the right side, and 2+ on the left side.  Pulmonary/Chest: Effort normal and breath sounds normal.  Lymphadenopathy:       Head (right side): No tonsillar, no preauricular, no posterior auricular and no occipital adenopathy present.       Head (left side): No tonsillar, no preauricular, no posterior auricular and no occipital adenopathy present.    He has no cervical adenopathy.       Right: No supraclavicular adenopathy present.       Left: No supraclavicular adenopathy present.  Neurological: He is alert and  oriented to person, place, and time. No sensory deficit.  Skin: Skin is warm, dry and intact. No rash noted. No cyanosis or erythema. Nails show no clubbing.  Psychiatric: He has a normal mood and affect. His speech is normal and behavior is normal.           Assessment & Plan:   1. Type 2 diabetes mellitus with complication, without long-term current use of insulin ([place]) Await labs. Adjust regimen as indicated by results. Needs eye exam. Contact information provided to schedule. - Comprehensive metabolic panel - Hemoglobin A1c - Lipid panel  2. Essential hypertension Controlled. Continue current treatment and follow-up with nephrology per their recommendations. - CBC with Differential/Platelet - Comprehensive metabolic panel - TSH  3. CKD stage 3 due to type 2 diabetes mellitus ([place]) See above. - Comprehensive metabolic panel - VITAMIN D 25 Hydroxy (Vit-D Deficiency, Fractures)  4. Hypothyroidism, unspecified type Await labs. Adjust regimen as indicated by results. - TSH  5. Microalbuminuria Continue to address HTN and diabetes.  6. B12 deficiency - Vitamin B12  7.  Abdominal aortic ectasia ([place]) Noted on screening US 06/2016. Repeat US in 5 years.   Fara Chute, PA-C Physician Assistant-Certified [facility] [place]

## 2016-09-27 LAB — CBC WITH DIFFERENTIAL/PLATELET
BASOS ABS: 0 10*3/uL (ref 0.0–0.2)
Basos: 0 %
EOS (ABSOLUTE): 0.3 10*3/uL (ref 0.0–0.4)
Eos: 3 %
Hematocrit: 41.3 % (ref 37.5–51.0)
Hemoglobin: 14.2 g/dL (ref 13.0–17.7)
Immature Grans (Abs): 0 10*3/uL (ref 0.0–0.1)
Immature Granulocytes: 0 %
LYMPHS ABS: 1.4 10*3/uL (ref 0.7–3.1)
Lymphs: 13 %
MCH: 29.2 pg (ref 26.6–33.0)
MCHC: 34.4 g/dL (ref 31.5–35.7)
MCV: 85 fL (ref 79–97)
MONOCYTES: 8 %
MONOS ABS: 0.9 10*3/uL (ref 0.1–0.9)
NEUTROS ABS: 7.9 10*3/uL — AB (ref 1.4–7.0)
Neutrophils: 76 %
Platelets: 287 10*3/uL (ref 150–379)
RBC: 4.87 x10E6/uL (ref 4.14–5.80)
RDW: 13.5 % (ref 12.3–15.4)
WBC: 10.6 10*3/uL (ref 3.4–10.8)

## 2016-09-27 LAB — LIPID PANEL
CHOL/HDL RATIO: 2.7 ratio (ref 0.0–5.0)
Cholesterol, Total: 125 mg/dL (ref 100–199)
HDL: 47 mg/dL (ref >39)
LDL Calculated: 60 mg/dL (ref 0–99)
TRIGLYCERIDES: 92 mg/dL (ref 0–149)
VLDL Cholesterol Cal: 18 mg/dL (ref 5–40)

## 2016-09-27 LAB — COMPREHENSIVE METABOLIC PANEL
ALBUMIN: 4.6 g/dL (ref 3.5–4.8)
ALK PHOS: 65 IU/L (ref 39–117)
ALT: 18 IU/L (ref 0–44)
AST: 20 IU/L (ref 0–40)
Albumin/Globulin Ratio: 2.1 (ref 1.2–2.2)
BUN / CREAT RATIO: 16 (ref 10–24)
BUN: 29 mg/dL — AB (ref 8–27)
Bilirubin Total: 0.5 mg/dL (ref 0.0–1.2)
CHLORIDE: 100 mmol/L (ref 96–106)
CO2: 24 mmol/L (ref 18–29)
CREATININE: 1.82 mg/dL — AB (ref 0.76–1.27)
Calcium: 9.6 mg/dL (ref 8.6–10.2)
GFR calc Af Amer: 43 mL/min/{1.73_m2} — ABNORMAL LOW (ref >59)
GFR calc non Af Amer: 37 mL/min/{1.73_m2} — ABNORMAL LOW (ref >59)
GLUCOSE: 121 mg/dL — AB (ref 65–99)
Globulin, Total: 2.2 g/dL (ref 1.5–4.5)
Potassium: 3.6 mmol/L (ref 3.5–5.2)
SODIUM: 144 mmol/L (ref 134–144)
Total Protein: 6.8 g/dL (ref 6.0–8.5)

## 2016-09-27 LAB — VITAMIN D 25 HYDROXY (VIT D DEFICIENCY, FRACTURES): VIT D 25 HYDROXY: 39.7 ng/mL (ref 30.0–100.0)

## 2016-09-27 LAB — TSH: TSH: 1.56 u[IU]/mL (ref 0.450–4.500)

## 2016-09-27 LAB — HEMOGLOBIN A1C
ESTIMATED AVERAGE GLUCOSE: 140 mg/dL
HEMOGLOBIN A1C: 6.5 % — AB (ref 4.8–5.6)

## 2016-09-27 LAB — VITAMIN B12: Vitamin B-12: 305 pg/mL (ref 232–1245)

## 2016-10-07 ENCOUNTER — Other Ambulatory Visit: Payer: Self-pay

## 2016-10-07 MED ORDER — INSULIN PEN NEEDLE 31G X 8 MM MISC: 1.0000 | Freq: Every day | 0 refills | Status: DC

## 2016-10-07 NOTE — Telephone Encounter (Signed)
FAX REQ [facility] - 30G PEN NEEDLES NOT AVAIL - REQ 31G - SENT

## 2016-10-13 DIAGNOSIS — I129 Hypertensive chronic kidney disease with stage 1 through stage 4 chronic kidney disease, or unspecified chronic kidney disease: Secondary | ICD-10-CM | POA: Diagnosis not present

## 2016-10-13 DIAGNOSIS — E1122 Type 2 diabetes mellitus with diabetic chronic kidney disease: Secondary | ICD-10-CM | POA: Diagnosis not present

## 2016-10-13 DIAGNOSIS — N183 Chronic kidney disease, stage 3 (moderate): Secondary | ICD-10-CM | POA: Diagnosis not present

## 2016-10-13 DIAGNOSIS — R809 Proteinuria, unspecified: Secondary | ICD-10-CM | POA: Diagnosis not present

## 2016-10-14 ENCOUNTER — Other Ambulatory Visit: Payer: Self-pay | Admitting: Family Medicine

## 2016-10-14 DIAGNOSIS — E039 Hypothyroidism, unspecified: Secondary | ICD-10-CM

## 2016-10-15 ENCOUNTER — Other Ambulatory Visit: Payer: Self-pay | Admitting: Physician Assistant

## 2016-10-21 ENCOUNTER — Other Ambulatory Visit: Payer: Self-pay | Admitting: Family Medicine

## 2016-10-21 DIAGNOSIS — E039 Hypothyroidism, unspecified: Secondary | ICD-10-CM

## 2016-11-02 ENCOUNTER — Other Ambulatory Visit: Payer: Self-pay | Admitting: Physician Assistant

## 2016-11-02 DIAGNOSIS — I10 Essential (primary) hypertension: Secondary | ICD-10-CM

## 2016-11-02 DIAGNOSIS — I517 Cardiomegaly: Secondary | ICD-10-CM

## 2016-11-02 DIAGNOSIS — E785 Hyperlipidemia, unspecified: Secondary | ICD-10-CM

## 2016-11-12 ENCOUNTER — Telehealth: Payer: Self-pay | Admitting: Physician Assistant

## 2016-11-13 MED ORDER — DULOXETINE HCL 30 MG PO CPEP: 30.0000 mg | ORAL_CAPSULE | Freq: Every day | ORAL | 3 refills | Status: DC

## 2016-11-13 NOTE — Telephone Encounter (Signed)
Meds ordered this encounter  Medications  . DULoxetine (CYMBALTA) 30 MG capsule    Sig: Take 1 capsule (30 mg total) by mouth daily.    Dispense:  90 capsule    Refill:  3    Patient aware

## 2016-11-15 ENCOUNTER — Other Ambulatory Visit: Payer: Self-pay | Admitting: Physician Assistant

## 2016-11-15 DIAGNOSIS — IMO0001 Reserved for inherently not codable concepts without codable children: Secondary | ICD-10-CM

## 2016-11-15 DIAGNOSIS — E1165 Type 2 diabetes mellitus with hyperglycemia: Principal | ICD-10-CM

## 2016-11-18 ENCOUNTER — Other Ambulatory Visit: Payer: Self-pay | Admitting: Physician Assistant

## 2016-11-18 DIAGNOSIS — E1165 Type 2 diabetes mellitus with hyperglycemia: Principal | ICD-10-CM

## 2016-11-18 DIAGNOSIS — IMO0001 Reserved for inherently not codable concepts without codable children: Secondary | ICD-10-CM

## 2016-12-07 ENCOUNTER — Ambulatory Visit (INDEPENDENT_AMBULATORY_CARE_PROVIDER_SITE_OTHER): Payer: Medicare Other | Admitting: Podiatry

## 2016-12-07 ENCOUNTER — Encounter: Payer: Self-pay | Admitting: Podiatry

## 2016-12-07 DIAGNOSIS — Z794 Long term (current) use of insulin: Secondary | ICD-10-CM

## 2016-12-07 DIAGNOSIS — E118 Type 2 diabetes mellitus with unspecified complications: Secondary | ICD-10-CM

## 2016-12-07 DIAGNOSIS — B351 Tinea unguium: Secondary | ICD-10-CM

## 2016-12-07 NOTE — Progress Notes (Signed)
Patient ID: COTE MAYABB, male   DOB: 1946-09-02, 71 y.o.   MRN: [id number]  Complaint:  Visit Type: Patient returns to my office for continued preventative foot care services. Complaint: Patient states" my nails have grown long and thick and become painful to walk and wear shoes" Patient has been diagnosed with DM with no complications. He presents for preventative foot care services. No changes to ROS  Podiatric Exam: Vascular: dorsalis pedis and posterior tibial pulses are palpable bilateral. Capillary return is immediate. Temperature gradient is WNL. Skin turgor WNL  Sensorium: Normal Semmes Weinstein monofilament test. Normal tactile sensation bilaterally. Nail Exam: Pt has thick disfigured discolored nails with subungual debris noted bilateral entire nail hallux through fifth toenails Ulcer Exam: There is no evidence of ulcer or pre-ulcerative changes or infection. Orthopedic Exam: Muscle tone and strength are WNL. No limitations in general ROM. No crepitus or effusions noted. Foot type and digits show no abnormalities. Bony prominences are unremarkable. Skin: No Porokeratosis. No infection or ulcers  Diagnosis:  Tinea unguium, Pain in right toe, pain in left toes  Treatment & Plan Procedures and Treatment: Consent by patient was obtained for treatment procedures. The patient understood the discussion of treatment and procedures well. All questions were answered thoroughly reviewed. Debridement of mycotic and hypertrophic toenails, 1 through 5 bilateral and clearing of subungual debris. No ulceration, no infection noted.  Return Visit-Office Procedure: Patient instructed to return to the office for a follow up visit 3 months for continued evaluation and treatment.   Gardiner Barefoot DPM

## 2016-12-26 ENCOUNTER — Ambulatory Visit (INDEPENDENT_AMBULATORY_CARE_PROVIDER_SITE_OTHER): Payer: Medicare Other | Admitting: Physician Assistant

## 2016-12-26 ENCOUNTER — Encounter: Payer: Self-pay | Admitting: Physician Assistant

## 2016-12-26 VITALS — BP 149/74 | HR 62 | Temp 97.3°F | Resp 18 | Ht 69.0 in | Wt 171.0 lb

## 2016-12-26 DIAGNOSIS — N183 Chronic kidney disease, stage 3 (moderate): Secondary | ICD-10-CM

## 2016-12-26 DIAGNOSIS — E039 Hypothyroidism, unspecified: Secondary | ICD-10-CM

## 2016-12-26 DIAGNOSIS — I10 Essential (primary) hypertension: Secondary | ICD-10-CM | POA: Diagnosis not present

## 2016-12-26 DIAGNOSIS — E1122 Type 2 diabetes mellitus with diabetic chronic kidney disease: Secondary | ICD-10-CM

## 2016-12-26 DIAGNOSIS — E118 Type 2 diabetes mellitus with unspecified complications: Secondary | ICD-10-CM | POA: Diagnosis not present

## 2016-12-26 NOTE — Progress Notes (Signed)
Patient ID: Bryan Knight, male    DOB: 1946-03-04, 71 y.o.   MRN: [id number]  PCP: Harrison Mons, PA-C  Chief Complaint  Patient presents with  . Follow-up    Subjective:   Presents for evaluation of diabetes, HTN.  He brings his home BP and glucose log for review. 107-156/65-92, pulse 55-76 Glucose 102-161  Refuses colon cancer screening, including Cologuard. No CP, SOB, HA, dizziness, nausea, vomiting, diarrhea. No melena or hematochezia. Generally feels discouraged.   Review of Systems Denies chest pain, shortness of breath, HA, dizziness, vision change, nausea, vomiting, diarrhea, constipation, melena, hematochezia, dysuria, increased urinary urgency or frequency, increased hunger or thirst, unintentional weight change, unexplained myalgias or arthralgias, rash.  Depression screen [facility] 2/9 12/26/2016 09/26/2016 06/20/2016 06/09/2016 06/02/2016  Decreased Interest 0 0 0 0 0  Down, Depressed, Hopeless 0 0 0 0 0  PHQ - 2 Score 0 0 0 0 0        Patient Active Problem List   Diagnosis Date Noted  . Abdominal aortic ectasia ([place]) 07/05/2016  . Shortness of breath 04/21/2016  . CKD stage 3 due to type 2 diabetes mellitus ([place]) 04/05/2016  . Knee pain, left 10/19/2015  . Diastolic dysfunction 69/62/9528  . Microalbuminuria 04/02/2015  . Depression 04/02/2015  . B12 deficiency 01/11/2012  . Hypertension 10/20/2011  . Type 2 diabetes mellitus with complication ([place]) 41/32/4401  . Hypothyroid 10/20/2011  . Rosacea 10/20/2011     Prior to Admission medications   Medication Sig Start Date End Date Taking? Authorizing Provider  Alcohol Swabs (ALCOHOL PREP) PADS Use daily for insulin injection 06/09/16  Yes Daylyn Azbill, PA-C  amLODipine (NORVASC) 10 MG tablet Take 1 tablet (10 mg total) by mouth daily. 08/17/16  Yes Alyana Kreiter, PA-C  aspirin 81 MG tablet Take 81 mg by mouth daily.    Yes Historical Provider, MD  atorvastatin (LIPITOR) 20 MG tablet TAKE 1 TABLET  (20 MG TOTAL) BY MOUTH DAILY. 11/02/16  Yes Augusten Lipkin, PA-C  chlorthalidone (HYGROTON) 50 MG tablet Take 50 mg by mouth daily. 12/03/15  Yes Historical Provider, MD  DULoxetine (CYMBALTA) 30 MG capsule Take 1 capsule (30 mg total) by mouth daily. 11/13/16  Yes Angel Weedon, PA-C  hydrALAZINE (APRESOLINE) 100 MG tablet  05/29/16  Yes Historical Provider, MD  Insulin Glargine (BASAGLAR KWIKPEN) 100 UNIT/ML SOPN Inject 0.1 mLs (10 Units total) into the skin at bedtime. 06/03/16  Yes Rynlee Lisbon, PA-C  Insulin Pen Needle (EASY TOUCH PEN NEEDLES) 31G X 8 MM MISC 1 each by Does not apply route daily. 10/07/16  Yes Margrete Delude, PA-C  JANUVIA 100 MG tablet TAKE 1 TABLET DAILY 10/15/16  Yes Caidance Sybert, PA-C  latanoprost (XALATAN) 0.005 % ophthalmic solution Place 1 drop into both eyes at bedtime.   Yes Historical Provider, MD  levothyroxine (SYNTHROID, LEVOTHROID) 75 MCG tablet TAKE 1 TABLET (75 MCG TOTAL) BY MOUTH DAILY. 10/15/16  Yes Achillies Buehl, PA-C  levothyroxine (SYNTHROID, LEVOTHROID) 75 MCG tablet TAKE 1 TABLET (75 MCG TOTAL) BY MOUTH DAILY. 10/22/16  Yes Aylanie Cubillos, PA-C  lisinopril (PRINIVIL,ZESTRIL) 40 MG tablet  04/17/16  Yes Historical Provider, MD  metFORMIN (GLUCOPHAGE) 500 MG tablet TAKE 1 TABLET (500 MG TOTAL) BY MOUTH 2 (TWO) TIMES DAILY WITH A MEAL. 11/15/16  Yes Tereasa Coop, PA-C  metFORMIN (GLUCOPHAGE) 500 MG tablet TAKE 1 TABLET (500 MG TOTAL) BY MOUTH 2 (TWO) TIMES DAILY WITH A MEAL. 11/18/16  Yes Ceairra Mccarver, PA-C  metoprolol  succinate (TOPROL-XL) 100 MG 24 hr tablet TAKE 1 TABLET (100 MG TOTAL) BY MOUTH DAILY. TAKE WITH OR IMMEDIATELY FOLLOWING A MEAL. 11/02/16  Yes Willer Osorno, PA-C  montelukast (SINGULAIR) 10 MG tablet TAKE ONE TABLET BY MOUTH NIGHTLY AT BEDTIME 03/17/16  Yes Temisha Murley, PA-C  ONETOUCH DELICA LANCETS 10G MISC USE TO TEST ONCE DAILY AS DIRECTED**MAX DAYS SUPPLY 90** 08/03/16  Yes Strider Vallance, PA-C  ONETOUCH VERIO test strip TEST BLOOD SUGAR  ONCE DAILY. 08/29/16  Yes Harrison Mons, PA-C     Allergies  Allergen Reactions  . Citalopram     Ineffective  . Tussionex Pennkinetic Er [Hydrocod Polst-Cpm Polst Er] Other (See Comments)    Foggy headed  . Wellbutrin [Bupropion Hcl] Other (See Comments)    Sluggish        Objective:  Physical Exam  Constitutional: He is oriented to person, place, and time. He appears well-developed and well-nourished. He is active and cooperative. No distress.  BP (!) 149/74   Pulse 62   Temp 97.3 F (36.3 C) (Oral)   Resp 18   Ht 5\' 9"  (1.753 m)   Wt 171 lb (77.6 kg)   SpO2 98%   BMI 25.25 kg/m   HENT:  Head: Normocephalic and atraumatic.  Right Ear: Hearing normal.  Left Ear: Hearing normal.  Eyes: Conjunctivae are normal. No scleral icterus.  Neck: Normal range of motion. Neck supple. No thyromegaly present.  Cardiovascular: Normal rate, regular rhythm and normal heart sounds.   Pulses:      Radial pulses are 2+ on the right side, and 2+ on the left side.  Pulmonary/Chest: Effort normal and breath sounds normal.  Lymphadenopathy:       Head (right side): No tonsillar, no preauricular, no posterior auricular and no occipital adenopathy present.       Head (left side): No tonsillar, no preauricular, no posterior auricular and no occipital adenopathy present.    He has no cervical adenopathy.       Right: No supraclavicular adenopathy present.       Left: No supraclavicular adenopathy present.  Neurological: He is alert and oriented to person, place, and time. No sensory deficit.  Skin: Skin is warm, dry and intact. No rash noted. No cyanosis or erythema. Nails show no clubbing.  Psychiatric: He has a normal mood and affect. His speech is normal and behavior is normal.           Assessment & Plan:   Problem List Items Addressed This Visit    Hypertension - Primary    Above goal here today, but apparent good control at home. Continue current treatment and follow-up with  Dr. Mercy Moore per his instructions.      Relevant Orders   CBC with Differential/Platelet (Completed)   Comprehensive metabolic panel (Completed)   Type 2 diabetes mellitus with complication ([place])    Await labs. Adjust regimen as indicated by results. Expect control, based on home readings.      Relevant Orders   Comprehensive metabolic panel (Completed)   Hemoglobin A1c (Completed)   Hypothyroid    Symptomatically euthyroid. Await lab results.      Relevant Orders   TSH (Completed)   T4, free (Completed)   CKD stage 3 due to type 2 diabetes mellitus ([facility])    Continue to maintain glucose and BP control, and follow-up with Dr. Mercy Moore per his instructions.      Relevant Orders   Comprehensive metabolic panel (Completed)  Return in about 3 months (around 03/27/2017) for re-evaluation of diabetes and blood pressure.   Fara Chute, PA-C [facility] [place]

## 2016-12-26 NOTE — Patient Instructions (Addendum)
Please schedule your eye exam. [facility]  [address], [place], [place] [address]  Phone: [phone]  [facility] [place], [place], [place] [address]  Phone: [phone]  [facility] [place], [place] [address]  Phone: [phone]   Please schedule a colonoscopy.  Read Being Mortal by Eda Keys. Bring me copies of your living will and health care power of attorney.    IF you received an x-ray today, you will receive an invoice from [facility]. Please contact [facility] at [phone] with questions or concerns regarding your invoice.   IF you received labwork today, you will receive an invoice from [facility]. Please contact [facility] at [phone] with questions or concerns regarding your invoice.   Our billing staff will not be able to assist you with questions regarding bills from these companies.  You will be contacted with the lab results as soon as they are available. The fastest way to get your results is to activate your My Chart account. Instructions are located on the last page of this paperwork. If you have not heard from [place] regarding the results in 2 weeks, please contact this office.

## 2016-12-27 LAB — COMPREHENSIVE METABOLIC PANEL
ALK PHOS: 69 IU/L (ref 39–117)
ALT: 21 IU/L (ref 0–44)
AST: 21 IU/L (ref 0–40)
Albumin/Globulin Ratio: 1.9 (ref 1.2–2.2)
Albumin: 4.7 g/dL (ref 3.5–4.8)
BILIRUBIN TOTAL: 0.4 mg/dL (ref 0.0–1.2)
BUN/Creatinine Ratio: 16 (ref 10–24)
BUN: 30 mg/dL — AB (ref 8–27)
CHLORIDE: 94 mmol/L — AB (ref 96–106)
CO2: 26 mmol/L (ref 18–29)
CREATININE: 1.86 mg/dL — AB (ref 0.76–1.27)
Calcium: 9.5 mg/dL (ref 8.6–10.2)
GFR calc Af Amer: 41 mL/min/{1.73_m2} — ABNORMAL LOW (ref >59)
GFR calc non Af Amer: 36 mL/min/{1.73_m2} — ABNORMAL LOW (ref >59)
GLUCOSE: 116 mg/dL — AB (ref 65–99)
Globulin, Total: 2.5 g/dL (ref 1.5–4.5)
Potassium: 3.5 mmol/L (ref 3.5–5.2)
Sodium: 139 mmol/L (ref 134–144)
Total Protein: 7.2 g/dL (ref 6.0–8.5)

## 2016-12-27 LAB — CBC WITH DIFFERENTIAL/PLATELET
Basophils Absolute: 0 10*3/uL (ref 0.0–0.2)
Basos: 0 %
EOS (ABSOLUTE): 0.3 10*3/uL (ref 0.0–0.4)
Eos: 3 %
Hematocrit: 43.5 % (ref 37.5–51.0)
Hemoglobin: 14.6 g/dL (ref 13.0–17.7)
Immature Grans (Abs): 0 10*3/uL (ref 0.0–0.1)
Immature Granulocytes: 0 %
LYMPHS ABS: 1.7 10*3/uL (ref 0.7–3.1)
LYMPHS: 16 %
MCH: 28.7 pg (ref 26.6–33.0)
MCHC: 33.6 g/dL (ref 31.5–35.7)
MCV: 86 fL (ref 79–97)
MONOCYTES: 10 %
Monocytes Absolute: 1.1 10*3/uL — ABNORMAL HIGH (ref 0.1–0.9)
NEUTROS ABS: 7.7 10*3/uL — AB (ref 1.4–7.0)
Neutrophils: 71 %
PLATELETS: 317 10*3/uL (ref 150–379)
RBC: 5.08 x10E6/uL (ref 4.14–5.80)
RDW: 13.8 % (ref 12.3–15.4)
WBC: 10.9 10*3/uL — AB (ref 3.4–10.8)

## 2016-12-27 LAB — TSH: TSH: 1.87 u[IU]/mL (ref 0.450–4.500)

## 2016-12-27 LAB — HEMOGLOBIN A1C
ESTIMATED AVERAGE GLUCOSE: 134 mg/dL
HEMOGLOBIN A1C: 6.3 % — AB (ref 4.8–5.6)

## 2016-12-27 LAB — T4, FREE: FREE T4: 1.61 ng/dL (ref 0.82–1.77)

## 2017-01-11 NOTE — Assessment & Plan Note (Signed)
Continue to maintain glucose and BP control, and follow-up with Dr. Mercy Moore per his instructions.

## 2017-01-11 NOTE — Assessment & Plan Note (Signed)
Above goal here today, but apparent good control at home. Continue current treatment and follow-up with Dr. Mercy Moore per his instructions.

## 2017-01-11 NOTE — Assessment & Plan Note (Signed)
Await labs. Adjust regimen as indicated by results. Expect control, based on home readings.

## 2017-01-11 NOTE — Assessment & Plan Note (Signed)
Symptomatically euthyroid. Await lab results.

## 2017-01-12 ENCOUNTER — Encounter: Payer: Self-pay | Admitting: Physician Assistant

## 2017-01-12 ENCOUNTER — Ambulatory Visit (INDEPENDENT_AMBULATORY_CARE_PROVIDER_SITE_OTHER): Payer: Medicare Other | Admitting: Physician Assistant

## 2017-01-12 VITALS — BP 160/74 | HR 67 | Temp 97.5°F | Resp 18 | Ht 68.03 in | Wt 167.0 lb

## 2017-01-12 DIAGNOSIS — H6121 Impacted cerumen, right ear: Secondary | ICD-10-CM

## 2017-01-12 DIAGNOSIS — E1122 Type 2 diabetes mellitus with diabetic chronic kidney disease: Secondary | ICD-10-CM | POA: Diagnosis not present

## 2017-01-12 DIAGNOSIS — R809 Proteinuria, unspecified: Secondary | ICD-10-CM | POA: Diagnosis not present

## 2017-01-12 DIAGNOSIS — H8111 Benign paroxysmal vertigo, right ear: Secondary | ICD-10-CM

## 2017-01-12 DIAGNOSIS — N183 Chronic kidney disease, stage 3 (moderate): Secondary | ICD-10-CM | POA: Diagnosis not present

## 2017-01-12 DIAGNOSIS — R112 Nausea with vomiting, unspecified: Secondary | ICD-10-CM

## 2017-01-12 DIAGNOSIS — I129 Hypertensive chronic kidney disease with stage 1 through stage 4 chronic kidney disease, or unspecified chronic kidney disease: Secondary | ICD-10-CM | POA: Diagnosis not present

## 2017-01-12 MED ORDER — MECLIZINE HCL 25 MG PO TABS
25.0000 mg | ORAL_TABLET | Freq: Three times a day (TID) | ORAL | 0 refills | Status: DC | PRN
Start: 2017-01-12 — End: 2017-04-25

## 2017-01-12 MED ORDER — ONDANSETRON 4 MG PO TBDP
4.0000 mg | ORAL_TABLET | Freq: Once | ORAL | Status: AC
  Administered 2017-01-12: 4 mg via ORAL

## 2017-01-12 NOTE — Patient Instructions (Addendum)
Benign Positional Vertigo Vertigo is the feeling that you or your surroundings are moving when they are not. Benign positional vertigo is the most common form of vertigo. The cause of this condition is not serious (is benign). This condition is triggered by certain movements and positions (is positional). This condition can be dangerous if it occurs while you are doing something that could endanger you or others, such as driving. What are the causes? In many cases, the cause of this condition is not known. It may be caused by a disturbance in an area of the inner ear that helps your brain to sense movement and balance. This disturbance can be caused by a viral infection (labyrinthitis), head injury, or repetitive motion. What increases the risk? This condition is more likely to develop in:  Women.  People who are 26 years of age or older. What are the signs or symptoms? Symptoms of this condition usually happen when you move your head or your eyes in different directions. Symptoms may start suddenly, and they usually last for less than a minute. Symptoms may include:  Loss of balance and falling.  Feeling like you are spinning or moving.  Feeling like your surroundings are spinning or moving.  Nausea and vomiting.  Blurred vision.  Dizziness.  Involuntary eye movement (nystagmus). Symptoms can be mild and cause only slight annoyance, or they can be severe and interfere with daily life. Episodes of benign positional vertigo may return (recur) over time, and they may be triggered by certain movements. Symptoms may improve over time. How is this diagnosed? This condition is usually diagnosed by medical history and a physical exam of the head, neck, and ears. You may be referred to a health care provider who specializes in ear, nose, and throat (ENT) problems (otolaryngologist) or a provider who specializes in disorders of the nervous system (neurologist). You may have additional testing,  including:  MRI.  A CT scan.  Eye movement tests. Your health care provider may ask you to change positions quickly while he or she watches you for symptoms of benign positional vertigo, such as nystagmus. Eye movement may be tested with an electronystagmogram (ENG), caloric stimulation, the Dix-Hallpike test, or the roll test.  An electroencephalogram (EEG). This records electrical activity in your brain.  Hearing tests. How is this treated? Usually, your health care provider will treat this by moving your head in specific positions to adjust your inner ear back to normal. Surgery may be needed in severe cases, but this is rare. In some cases, benign positional vertigo may resolve on its own in 2-4 weeks. Follow these instructions at home: Safety   Move slowly.Avoid sudden body or head movements.  Avoid driving.  Avoid operating heavy machinery.  Avoid doing any tasks that would be dangerous to you or others if a vertigo episode would occur.  If you have trouble walking or keeping your balance, try using a cane for stability. If you feel dizzy or unstable, sit down right away.  Return to your normal activities as told by your health care provider. Ask your health care provider what activities are safe for you. General instructions   Take over-the-counter and prescription medicines only as told by your health care provider.  Avoid certain positions or movements as told by your health care provider.  Drink enough fluid to keep your urine clear or pale yellow.  Keep all follow-up visits as told by your health care provider. This is important. Contact a health care  provider if:  You have a fever.  Your condition gets worse or you develop new symptoms.  Your family or friends notice any behavioral changes.  Your nausea or vomiting gets worse.  You have numbness or a "pins and needles" sensation. Get help right away if:  You have difficulty speaking or moving.  You are  always dizzy.  You faint.  You develop severe headaches.  You have weakness in your legs or arms.  You have changes in your hearing or vision.  You develop a stiff neck.  You develop sensitivity to light. This information is not intended to replace advice given to you by your health care provider. Make sure you discuss any questions you have with your health care provider. Document Released: 06/12/2006 Document Revised: 02/10/2016 Document Reviewed: 12/28/2014 Elsevier Interactive Patient Education  2017 [employer].    IF you received an x-ray today, you will receive an invoice from [facility]. Please contact [facility] at [phone] with questions or concerns regarding your invoice.   IF you received labwork today, you will receive an invoice from [place]. Please contact [facility] at [phone] with questions or concerns regarding your invoice.   Our billing staff will not be able to assist you with questions regarding bills from these companies.  You will be contacted with the lab results as soon as they are available. The fastest way to get your results is to activate your My Chart account. Instructions are located on the last page of this paperwork. If you have not heard from [place] regarding the results in 2 weeks, please contact this office.

## 2017-01-12 NOTE — Progress Notes (Signed)
Bryan Knight  MRN: [id number] DOB: 09/11/1946  PCP: Harrison Mons, PA-C  Chief Complaint  Patient presents with  . Nausea    X 1 day  . Dizziness    X 1 day    Subjective:  Pt presents to clinic for dizziness that started this am. He woke up as usual took his medications and then he bent over the tub to wash his hair and he got dizzy and then he vomited several times - he then felt bette r- went on with his daily activities.  After his morning appt he went home to lay down and he got dizzy and then again started to vomit.  He overall does not feel good but not sick.  He has had no recent colds or allergy symptoms.  He has noticed that the hearing in his right ear is worse that normal.  Review of Systems  Constitutional: Negative for chills and fever.  HENT: Positive for hearing loss (right side). Negative for congestion and ear pain.   Neurological: Positive for dizziness (spinning to the right). Negative for weakness, light-headedness and headaches.    Patient Active Problem List   Diagnosis Date Noted  . Abdominal aortic ectasia ([place]) 07/05/2016  . Shortness of breath 04/21/2016  . CKD stage 3 due to type 2 diabetes mellitus ([place]) 04/05/2016  . Knee pain, left 10/19/2015  . Diastolic dysfunction 42/70/6237  . Microalbuminuria 04/02/2015  . Depression 04/02/2015  . B12 deficiency 01/11/2012  . Hypertension 10/20/2011  . Type 2 diabetes mellitus with complication ([place]) 62/83/1517  . Hypothyroid 10/20/2011  . Rosacea 10/20/2011    Current Outpatient Prescriptions on File Prior to Visit  Medication Sig Dispense Refill  . Alcohol Swabs (ALCOHOL PREP) PADS Use daily for insulin injection 100 each prn  . amLODipine (NORVASC) 10 MG tablet Take 1 tablet (10 mg total) by mouth daily. 90 tablet 3  . aspirin 81 MG tablet Take 81 mg by mouth daily.     Marland Kitchen atorvastatin (LIPITOR) 20 MG tablet TAKE 1 TABLET (20 MG TOTAL) BY MOUTH DAILY. 90 tablet 0  . chlorthalidone (HYGROTON) 50 MG  tablet Take 50 mg by mouth daily.  6  . DULoxetine (CYMBALTA) 30 MG capsule Take 1 capsule (30 mg total) by mouth daily. 90 capsule 3  . hydrALAZINE (APRESOLINE) 100 MG tablet     . Insulin Glargine (BASAGLAR KWIKPEN) 100 UNIT/ML SOPN Inject 0.1 mLs (10 Units total) into the skin at bedtime. 15 mL 1  . Insulin Pen Needle (EASY TOUCH PEN NEEDLES) 31G X 8 MM MISC 1 each by Does not apply route daily. 100 each 0  . JANUVIA 100 MG tablet TAKE 1 TABLET DAILY 90 tablet 0  . latanoprost (XALATAN) 0.005 % ophthalmic solution Place 1 drop into both eyes at bedtime.    Marland Kitchen levothyroxine (SYNTHROID, LEVOTHROID) 75 MCG tablet TAKE 1 TABLET (75 MCG TOTAL) BY MOUTH DAILY. 30 tablet 5  . lisinopril (PRINIVIL,ZESTRIL) 40 MG tablet     . metFORMIN (GLUCOPHAGE) 500 MG tablet TAKE 1 TABLET (500 MG TOTAL) BY MOUTH 2 (TWO) TIMES DAILY WITH A MEAL. 180 tablet 0  . metoprolol succinate (TOPROL-XL) 100 MG 24 hr tablet TAKE 1 TABLET (100 MG TOTAL) BY MOUTH DAILY. TAKE WITH OR IMMEDIATELY FOLLOWING A MEAL. 90 tablet 0  . montelukast (SINGULAIR) 10 MG tablet TAKE ONE TABLET BY MOUTH NIGHTLY AT BEDTIME 90 tablet 3  . ONETOUCH DELICA LANCETS 61Y MISC USE TO TEST ONCE DAILY AS DIRECTED**MAX  DAYS SUPPLY 90** 100 each 0  . ONETOUCH VERIO test strip TEST BLOOD SUGAR ONCE DAILY. 100 each 3   No current facility-administered medications on file prior to visit.     Allergies  Allergen Reactions  . Citalopram     Ineffective  . Tussionex Pennkinetic Er [Hydrocod Polst-Cpm Polst Er] Other (See Comments)    Foggy headed  . Wellbutrin [Bupropion Hcl] Other (See Comments)    Sluggish     Pt patients past, family and social history were reviewed and updated.   Objective:  BP (!) 160/74 (BP Location: Right Arm, Patient Position: Sitting, Cuff Size: Small)   Pulse 67   Temp 97.5 F (36.4 C) (Oral)   Resp 18   Ht 5' 8.03" (1.728 m)   Wt 167 lb (75.8 kg)   SpO2 96%   BMI 25.37 kg/m   Physical Exam  Constitutional: He is  oriented to person, place, and time and well-developed, well-nourished, and in no distress.  HENT:  Head: Normocephalic and atraumatic.  Right Ear: External ear normal. A foreign body (cerumen) is present.  Left Ear: External ear normal. No foreign bodies.  Nose: No mucosal edema.  Mouth/Throat: Uvula is midline, oropharynx is clear and moist and mucous membranes are normal.  Cerumen removed with lavage.  Eyes: Conjunctivae are normal. Right eye exhibits nystagmus (horizontal worse on this side with gaze to the right). Left eye exhibits nystagmus (horizontal).  Dix hallpike -- with looking to the right spinning to the right - when looks to the left it stops - spinning stops when he sits up  Neck: Normal range of motion.  Pulmonary/Chest: Effort normal.  Neurological: He is alert and oriented to person, place, and time. Gait normal.  Vertigo spins to the right - after ear lavage - epley maneuvers are attempted but pt is unable to tolerate due to nausea - emesis x3 - zofran given and patient starts to feel better and vertigo resolves  Skin: Skin is warm and dry.  Psychiatric: Mood, memory, affect and judgment normal.    Assessment and Plan :  Impacted cerumen of right ear - Plan: Ear wax removal  Benign paroxysmal positional vertigo of right ear - Plan: meclizine (ANTIVERT) 25 MG tablet  Nausea and vomiting, intractability of vomiting not specified, unspecified vomiting type - Plan: ondansetron (ZOFRAN-ODT) disintegrating tablet 4 mg   Pt was unable to tolerate the Eply maneuvers - antivert was given and pt was advise he may want to start in a recliner and then slowly lay down to prevent his currently severe vertigo - he has no addition symptoms other than the resulting nausea and vomiting and otherwise feels fine.  He felt fine to drive and his questions were answered before he left the office.  Windell Hummingbird PA-C  [facility] 01/12/2017 4:22 PM

## 2017-01-17 ENCOUNTER — Other Ambulatory Visit: Payer: Self-pay | Admitting: Physician Assistant

## 2017-01-22 ENCOUNTER — Other Ambulatory Visit: Payer: Self-pay | Admitting: Physician Assistant

## 2017-01-22 DIAGNOSIS — E118 Type 2 diabetes mellitus with unspecified complications: Secondary | ICD-10-CM

## 2017-02-03 ENCOUNTER — Other Ambulatory Visit: Payer: Self-pay | Admitting: Physician Assistant

## 2017-02-03 DIAGNOSIS — E785 Hyperlipidemia, unspecified: Secondary | ICD-10-CM

## 2017-02-03 DIAGNOSIS — I10 Essential (primary) hypertension: Secondary | ICD-10-CM

## 2017-02-03 DIAGNOSIS — I517 Cardiomegaly: Secondary | ICD-10-CM

## 2017-02-13 ENCOUNTER — Other Ambulatory Visit: Payer: Self-pay | Admitting: Physician Assistant

## 2017-02-13 DIAGNOSIS — E1165 Type 2 diabetes mellitus with hyperglycemia: Principal | ICD-10-CM

## 2017-02-13 DIAGNOSIS — IMO0001 Reserved for inherently not codable concepts without codable children: Secondary | ICD-10-CM

## 2017-02-19 ENCOUNTER — Other Ambulatory Visit: Payer: Self-pay | Admitting: Physician Assistant

## 2017-02-19 DIAGNOSIS — E118 Type 2 diabetes mellitus with unspecified complications: Secondary | ICD-10-CM

## 2017-02-21 ENCOUNTER — Encounter: Payer: Self-pay | Admitting: Physician Assistant

## 2017-02-21 DIAGNOSIS — E1165 Type 2 diabetes mellitus with hyperglycemia: Principal | ICD-10-CM

## 2017-02-21 DIAGNOSIS — IMO0001 Reserved for inherently not codable concepts without codable children: Secondary | ICD-10-CM

## 2017-02-21 MED ORDER — METFORMIN HCL 500 MG PO TABS: 500.0000 mg | ORAL_TABLET | Freq: Two times a day (BID) | ORAL | 0 refills | Status: DC

## 2017-03-02 ENCOUNTER — Ambulatory Visit (INDEPENDENT_AMBULATORY_CARE_PROVIDER_SITE_OTHER): Payer: Medicare Other | Admitting: Podiatry

## 2017-03-02 ENCOUNTER — Encounter: Payer: Self-pay | Admitting: Podiatry

## 2017-03-02 DIAGNOSIS — B351 Tinea unguium: Secondary | ICD-10-CM

## 2017-03-02 DIAGNOSIS — M79676 Pain in unspecified toe(s): Secondary | ICD-10-CM

## 2017-03-02 DIAGNOSIS — Z794 Long term (current) use of insulin: Secondary | ICD-10-CM

## 2017-03-02 DIAGNOSIS — E118 Type 2 diabetes mellitus with unspecified complications: Secondary | ICD-10-CM

## 2017-03-02 NOTE — Progress Notes (Signed)
Patient ID: Bryan Knight, male   DOB: [birth year], 71 y.o.   MRN: [id number]  Complaint:  Visit Type: Patient returns to my office for continued preventative foot care services. Complaint: Patient states" my nails have grown long and thick and become painful to walk and wear shoes" Patient has been diagnosed with DM with no complications. He presents for preventative foot care services. No changes to ROS  Podiatric Exam: Vascular: dorsalis pedis and posterior tibial pulses are palpable bilateral. Capillary return is immediate. Temperature gradient is WNL. Skin turgor WNL  Sensorium: Normal Semmes Weinstein monofilament test. Normal tactile sensation bilaterally. Nail Exam: Pt has thick disfigured discolored nails with subungual debris noted bilateral entire nail hallux through fifth toenails Ulcer Exam: There is no evidence of ulcer or pre-ulcerative changes or infection. Orthopedic Exam: Muscle tone and strength are WNL. No limitations in general ROM. No crepitus or effusions noted. Foot type and digits show no abnormalities. Bony prominences are unremarkable. Skin: No Porokeratosis. No infection or ulcers  Diagnosis:  Tinea unguium, Pain in right toe, pain in left toes  Treatment & Plan Procedures and Treatment: Consent by patient was obtained for treatment procedures. The patient understood the discussion of treatment and procedures well. All questions were answered thoroughly reviewed. Debridement of mycotic and hypertrophic toenails, 1 through 5 bilateral and clearing of subungual debris. No ulceration, no infection noted.  Return Visit-Office Procedure: Patient instructed to return to the office for a follow up visit 3 months for continued evaluation and treatment.   Gardiner Barefoot DPM

## 2017-03-14 DIAGNOSIS — E119 Type 2 diabetes mellitus without complications: Secondary | ICD-10-CM | POA: Diagnosis not present

## 2017-03-14 DIAGNOSIS — H40013 Open angle with borderline findings, low risk, bilateral: Secondary | ICD-10-CM | POA: Diagnosis not present

## 2017-03-14 DIAGNOSIS — Z961 Presence of intraocular lens: Secondary | ICD-10-CM | POA: Diagnosis not present

## 2017-03-20 ENCOUNTER — Other Ambulatory Visit: Payer: Self-pay | Admitting: Physician Assistant

## 2017-03-27 ENCOUNTER — Encounter: Payer: Self-pay | Admitting: Physician Assistant

## 2017-03-27 ENCOUNTER — Ambulatory Visit (INDEPENDENT_AMBULATORY_CARE_PROVIDER_SITE_OTHER): Payer: Medicare Other | Admitting: Physician Assistant

## 2017-03-27 VITALS — BP 134/74 | HR 65 | Temp 97.9°F | Resp 18 | Ht 68.03 in | Wt 168.2 lb

## 2017-03-27 DIAGNOSIS — E039 Hypothyroidism, unspecified: Secondary | ICD-10-CM | POA: Diagnosis not present

## 2017-03-27 DIAGNOSIS — I10 Essential (primary) hypertension: Secondary | ICD-10-CM | POA: Diagnosis not present

## 2017-03-27 DIAGNOSIS — L711 Rhinophyma: Secondary | ICD-10-CM

## 2017-03-27 DIAGNOSIS — E1122 Type 2 diabetes mellitus with diabetic chronic kidney disease: Secondary | ICD-10-CM | POA: Diagnosis not present

## 2017-03-27 DIAGNOSIS — E118 Type 2 diabetes mellitus with unspecified complications: Secondary | ICD-10-CM

## 2017-03-27 DIAGNOSIS — R809 Proteinuria, unspecified: Secondary | ICD-10-CM

## 2017-03-27 DIAGNOSIS — N183 Chronic kidney disease, stage 3 unspecified: Secondary | ICD-10-CM

## 2017-03-27 NOTE — Progress Notes (Signed)
Patient ID: Bryan Knight, male    DOB: 1945-11-06, 71 y.o.   MRN: [id number]  PCP: Harrison Mons, PA-C  Chief Complaint  Patient presents with  . Hypertension    04/40/2018 149/74, Todays: 152/77  . Diabetes  . Follow-up    3 month follow up    Subjective:   Presents for evaluation of HTN and diabetes.  He has brought in his logs. BP 117-153/6984 (only three readings SBP>140). Glucose 104-140.  He is walking a couple of times/week, 1-2 miles each. Continues to enjoy junk food, mostly snacks.  Declines Td booster. Refuses to consider colonoscopy. Will think about Cologuard   Review of Systems  Constitutional: Negative for activity change, appetite change, fatigue and unexpected weight change.  HENT: Negative for congestion, dental problem, ear pain, hearing loss, mouth sores, postnasal drip, rhinorrhea, sneezing, sore throat, tinnitus and trouble swallowing.   Eyes: Negative for photophobia, pain, redness and visual disturbance.  Respiratory: Negative for cough, chest tightness and shortness of breath.   Cardiovascular: Negative for chest pain, palpitations and leg swelling.  Gastrointestinal: Negative for abdominal pain, blood in stool, constipation, diarrhea, nausea and vomiting.  Endocrine: Negative for cold intolerance, heat intolerance, polydipsia, polyphagia and polyuria.  Genitourinary: Negative for dysuria, frequency, hematuria and urgency.  Musculoskeletal: Negative for arthralgias, gait problem, myalgias and neck stiffness.  Skin: Negative for rash.  Neurological: Negative for dizziness, speech difficulty, weakness, light-headedness, numbness and headaches.  Hematological: Negative for adenopathy.  Psychiatric/Behavioral: Negative for confusion and sleep disturbance. The patient is not nervous/anxious.        Patient Active Problem List   Diagnosis Date Noted  . Rhinophyma 03/27/2017  . Abdominal aortic ectasia ([place]) 07/05/2016  . Shortness of  breath 04/21/2016  . CKD stage 3 due to type 2 diabetes mellitus ([place]) 04/05/2016  . Knee pain, left 10/19/2015  . Diastolic dysfunction 51/10/5850  . Microalbuminuria 04/02/2015  . Depression 04/02/2015  . B12 deficiency 01/11/2012  . Hypertension 10/20/2011  . Type 2 diabetes mellitus with complication ([place]) 77/82/4235  . Hypothyroid 10/20/2011  . Rosacea 10/20/2011     Prior to Admission medications   Medication Sig Start Date End Date Taking? Authorizing Provider  Alcohol Swabs (ALCOHOL PREP) PADS Use daily for insulin injection 06/09/16  Yes Danahi Reddish, PA-C  amLODipine (NORVASC) 10 MG tablet Take 1 tablet (10 mg total) by mouth daily. 08/17/16  Yes Romari Gasparro, PA-C  aspirin 81 MG tablet Take 81 mg by mouth daily.    Yes [provider]  atorvastatin (LIPITOR) 20 MG tablet TAKE 1 TABLET (20 MG TOTAL) BY MOUTH DAILY. 02/06/17  Yes Chai Routh, PA-C  chlorthalidone (HYGROTON) 50 MG tablet Take 50 mg by mouth daily. 12/03/15  Yes [provider]  DULoxetine (CYMBALTA) 30 MG capsule Take 1 capsule (30 mg total) by mouth daily. 11/13/16  Yes Linsi Humann, PA-C  hydrALAZINE (APRESOLINE) 100 MG tablet  05/29/16  Yes [provider]  Insulin Glargine (BASAGLAR KWIKPEN) 100 UNIT/ML SOPN INJECT 0.1 MLS (10 UNITS TOTAL) INTO THE SKIN AT BEDTIME. 02/19/17  Yes Cephas Revard, PA-C  Insulin Pen Needle (EASY TOUCH PEN NEEDLES) 31G X 8 MM MISC 1 each by Does not apply route daily. 10/07/16  Yes Ticara Waner, PA-C  JANUVIA 100 MG tablet TAKE 1 TABLET DAILY 01/18/17  Yes Cathe Bilger, PA-C  levothyroxine (SYNTHROID, LEVOTHROID) 75 MCG tablet TAKE 1 TABLET (75 MCG TOTAL) BY MOUTH DAILY. 10/22/16  Yes Matin Mattioli, PA-C  lisinopril (  PRINIVIL,ZESTRIL) 40 MG tablet  04/17/16  Yes [provider]  metFORMIN (GLUCOPHAGE) 500 MG tablet Take 1 tablet (500 mg total) by mouth 2 (two) times daily with a meal. 02/21/17  Yes Billyjoe Go, PA-C  metoprolol  succinate (TOPROL-XL) 100 MG 24 hr tablet TAKE 1 TABLET (100 MG TOTAL) BY MOUTH DAILY. TAKE WITH OR IMMEDIATELY FOLLOWING A MEAL. 02/06/17  Yes Arriel Victor, PA-C  montelukast (SINGULAIR) 10 MG tablet TAKE ONE TABLET BY MOUTH NIGHTLY AT BEDTIME 03/20/17  Yes Tinslee Klare, PA-C  ONETOUCH DELICA LANCETS 70J MISC USE TO TEST ONCE DAILY AS DIRECTED**MAX DAYS SUPPLY 90** 08/03/16  Yes Jerzy Roepke, PA-C  ONETOUCH VERIO test strip TEST BLOOD SUGAR ONCE DAILY. 08/29/16  Yes Avrian Delfavero, PA-C  meclizine (ANTIVERT) 25 MG tablet Take 1 tablet (25 mg total) by mouth 3 (three) times daily as needed for dizziness. Patient not taking: Reported on 03/27/2017 01/12/17   Mancel Bale, PA-C     Allergies  Allergen Reactions  . Citalopram     Ineffective  . Tussionex Pennkinetic Er [Hydrocod Polst-Cpm Polst Er] Other (See Comments)    Foggy headed  . Wellbutrin [Bupropion Hcl] Other (See Comments)    Sluggish        Objective:  Physical Exam  Constitutional: He is oriented to person, place, and time. He appears well-developed and well-nourished. He is active and cooperative. No distress.  BP 134/74 (BP Location: Left Arm, Patient Position: Sitting, Cuff Size: Normal)   Pulse 65   Temp 97.9 F (36.6 C) (Oral)   Resp 18   Ht 5' 8.03" (1.728 m)   Wt 168 lb 3.2 oz (76.3 kg)   SpO2 98%   BMI 25.55 kg/m   HENT:  Head: Normocephalic and atraumatic.  Right Ear: Hearing normal.  Left Ear: Hearing normal.  Eyes: Conjunctivae are normal. No scleral icterus.  Neck: Normal range of motion. Neck supple. No thyromegaly present.  Cardiovascular: Normal rate, regular rhythm and normal heart sounds.   Pulses:      Radial pulses are 2+ on the right side, and 2+ on the left side.  Pulmonary/Chest: Effort normal and breath sounds normal.  Lymphadenopathy:       Head (right side): No tonsillar, no preauricular, no posterior auricular and no occipital adenopathy present.       Head (left side): No  tonsillar, no preauricular, no posterior auricular and no occipital adenopathy present.    He has no cervical adenopathy.       Right: No supraclavicular adenopathy present.       Left: No supraclavicular adenopathy present.  Neurological: He is alert and oriented to person, place, and time. No sensory deficit.  Skin: Skin is warm, dry and intact. No rash noted. No cyanosis or erythema. Nails show no clubbing.  Psychiatric: He has a normal mood and affect. His speech is normal and behavior is normal.           Assessment & Plan:   Problem List Items Addressed This Visit    Hypertension    Controlled. Continue current treatment.      Relevant Orders   CBC with Differential/Platelet (Completed)   Comprehensive metabolic panel (Completed)   Lipid panel (Completed)   Type 2 diabetes mellitus with complication ([place]) - Primary    Appears well controlled. AWait labs.      Relevant Orders   Comprehensive metabolic panel (Completed)   Hemoglobin A1c (Completed)   Microalbumin, urine (Completed)   Hypothyroid  Symptomatically euthyroid. Await lab results.      Relevant Orders   TSH (Completed)   Microalbuminuria   Relevant Orders   Microalbumin, urine (Completed)   CKD stage 3 due to type 2 diabetes mellitus ([place])    COntinue to control BP and glucose. Follow-up with nephrology per Dr. Etheleen Nicks recommendations.      Relevant Orders   Comprehensive metabolic panel (Completed)   Rhinophyma       Return in about 3 months (around 06/27/2017) for [facility] Visit and blood pressure and diabetes follow-up.   Fara Chute, PA-C [facility]

## 2017-03-27 NOTE — Patient Instructions (Addendum)
     IF you received an x-ray today, you will receive an invoice from [facility]. Please contact [facility] at [phone] with questions or concerns regarding your invoice.   IF you received labwork today, you will receive an invoice from [facility]. Please contact [facility] at [phone] with questions or concerns regarding your invoice.   Our billing staff will not be able to assist you with questions regarding bills from these companies.  You will be contacted with the lab results as soon as they are available. The fastest way to get your results is to activate your My Chart account. Instructions are located on the last page of this paperwork. If you have not heard from us regarding the results in 2 weeks, please contact this office.     

## 2017-03-27 NOTE — Progress Notes (Signed)
Subjective:    Patient ID: Bryan Knight, male    DOB: 1946-05-03, 71 y.o.   MRN: [id number] PCP: Harrison Mons, PA-C   HPI Bryan Knight presents for 3 month re-evaluation of diabetes and HTN.  Brought home BP and glucose log with him today. Systolics range 030-092 (one value of 153). Diastolics range 33-00. Takes glucose reading daily before breakfast. Range 104-140, most between 110-130.  Exercise: Walks a couple times a week, 1-2 miles. Diet: "Way too much junk food." Wife cooks at home, but likes to snack between meals.  He has no concerns.  Review of Systems  Constitutional: Negative for activity change.  Respiratory: Negative for shortness of breath.   Cardiovascular: Negative for chest pain, palpitations and leg swelling.  Gastrointestinal: Negative for abdominal pain and blood in stool.  Endocrine: Negative for polyphagia and polyuria.  Genitourinary: Negative for hematuria.  Musculoskeletal: Negative for arthralgias.  Skin: Negative for color change and rash.  Neurological: Negative for dizziness and headaches.   Patient Active Problem List   Diagnosis Date Noted  . Abdominal aortic ectasia ([place]) 07/05/2016  . Shortness of breath 04/21/2016  . CKD stage 3 due to type 2 diabetes mellitus ([place]) 04/05/2016  . Knee pain, left 10/19/2015  . Diastolic dysfunction 76/22/6333  . Microalbuminuria 04/02/2015  . Depression 04/02/2015  . B12 deficiency 01/11/2012  . Hypertension 10/20/2011  . Type 2 diabetes mellitus with complication ([place]) 54/56/2563  . Hypothyroid 10/20/2011  . Rosacea 10/20/2011   Prior to Admission medications   Medication Sig Start Date End Date Taking? Authorizing Provider  Alcohol Swabs (ALCOHOL PREP) PADS Use daily for insulin injection 06/09/16  Yes Jeffery, Chelle, PA-C  amLODipine (NORVASC) 10 MG tablet Take 1 tablet (10 mg total) by mouth daily. 08/17/16  Yes Jeffery, Chelle, PA-C  aspirin 81 MG tablet Take 81 mg by mouth daily.    Yes  [provider]  atorvastatin (LIPITOR) 20 MG tablet TAKE 1 TABLET (20 MG TOTAL) BY MOUTH DAILY. 02/06/17  Yes Jeffery, Chelle, PA-C  chlorthalidone (HYGROTON) 50 MG tablet Take 50 mg by mouth daily. 12/03/15  Yes [provider]  DULoxetine (CYMBALTA) 30 MG capsule Take 1 capsule (30 mg total) by mouth daily. 11/13/16  Yes Jeffery, Chelle, PA-C  hydrALAZINE (APRESOLINE) 100 MG tablet  05/29/16  Yes [provider]  Insulin Glargine (BASAGLAR KWIKPEN) 100 UNIT/ML SOPN INJECT 0.1 MLS (10 UNITS TOTAL) INTO THE SKIN AT BEDTIME. 02/19/17  Yes Jeffery, Chelle, PA-C  Insulin Pen Needle (EASY TOUCH PEN NEEDLES) 31G X 8 MM MISC 1 each by Does not apply route daily. 10/07/16  Yes Jeffery, Chelle, PA-C  JANUVIA 100 MG tablet TAKE 1 TABLET DAILY 01/18/17  Yes Jeffery, Chelle, PA-C  levothyroxine (SYNTHROID, LEVOTHROID) 75 MCG tablet TAKE 1 TABLET (75 MCG TOTAL) BY MOUTH DAILY. 10/22/16  Yes Jeffery, Chelle, PA-C  lisinopril (PRINIVIL,ZESTRIL) 40 MG tablet  04/17/16  Yes [provider]  metFORMIN (GLUCOPHAGE) 500 MG tablet Take 1 tablet (500 mg total) by mouth 2 (two) times daily with a meal. 02/21/17  Yes Jeffery, Chelle, PA-C  metoprolol succinate (TOPROL-XL) 100 MG 24 hr tablet TAKE 1 TABLET (100 MG TOTAL) BY MOUTH DAILY. TAKE WITH OR IMMEDIATELY FOLLOWING A MEAL. 02/06/17  Yes Jeffery, Chelle, PA-C  montelukast (SINGULAIR) 10 MG tablet TAKE ONE TABLET BY MOUTH NIGHTLY AT BEDTIME 03/20/17  Yes Jeffery, Chelle, PA-C  ONETOUCH DELICA LANCETS 89H MISC USE TO TEST ONCE DAILY AS DIRECTED**MAX DAYS SUPPLY 90** 08/03/16  Yes Jeffery, Chelle, PA-C  ONETOUCH VERIO test strip TEST BLOOD SUGAR ONCE DAILY. 08/29/16  Yes Jeffery, Chelle, PA-C  latanoprost (XALATAN) 0.005 % ophthalmic solution Place 1 drop into both eyes at bedtime.    [provider]  meclizine (ANTIVERT) 25 MG tablet Take 1 tablet (25 mg total) by mouth 3 (three) times daily as needed for dizziness. Patient not taking:  Reported on 03/27/2017 01/12/17   Mancel Bale, PA-C   Allergies  Allergen Reactions  . Citalopram     Ineffective  . Tussionex Pennkinetic Er [Hydrocod Polst-Cpm Polst Er] Other (See Comments)    Foggy headed  . Wellbutrin [Bupropion Hcl] Other (See Comments)    Sluggish       Objective:   Physical Exam  Constitutional: He appears well-developed.  BP (!) 152/77 (BP Location: Right Arm, Patient Position: Sitting, Cuff Size: Normal)   Pulse 65   Temp 97.9 F (36.6 C) (Oral)   Resp 18   Ht 5' 8.03" (1.728 m)   Wt 168 lb 3.2 oz (76.3 kg)   SpO2 98%   BMI 25.55 kg/m    HENT:  Head: Normocephalic and atraumatic.  Right Ear: External ear normal.  Left Ear: External ear normal.  Nose: Nose normal.  Neck: Normal range of motion. Neck supple.  Cardiovascular: Normal rate, regular rhythm, normal heart sounds and intact distal pulses.   Pulses:      Radial pulses are 2+ on the right side, and 2+ on the left side.       Dorsalis pedis pulses are 2+ on the right side, and 2+ on the left side.       Posterior tibial pulses are 2+ on the right side, and 2+ on the left side.  Pulmonary/Chest: Effort normal and breath sounds normal.  Lymphadenopathy:    He has no cervical adenopathy.  Neurological: He is alert.  Skin: Skin is warm and dry.  Psychiatric: He has a normal mood and affect. His behavior is normal.   Diabetic Foot Exam - Simple   Simple Foot Form Diabetic Foot exam was performed with the following findings:  Yes 03/27/2017  3:43 PM  Visual Inspection No deformities, no ulcerations, no other skin breakdown bilaterally:  Yes Sensation Testing Intact to touch and monofilament testing bilaterally:  Yes Pulse Check Posterior Tibialis and Dorsalis pulse intact bilaterally:  Yes Comments       Assessment & Plan:   1. Type 2 diabetes mellitus with complication, without long-term current use of insulin ([place]) Labs pending. Expect stability based on home readings. Adjust  regimen as indicated by results. - Comprehensive metabolic panel - Hemoglobin A1c  2. Essential hypertension Above goal in office today, but good control at home. Continue current treatment. - CBC with Differential/Platelet - Comprehensive metabolic panel  3. CKD stage 3 due to type 2 diabetes mellitus ([place]) Labs pending. Continue control of HTN and DM. Follow up with Dr. Mercy Moore. - Comprehensive metabolic panel  4. Hypothyroidism, unspecified type Symptomatically euthyroid. Pending labs. - TSH  5. Microalbuminuria Labs pending. Continue monitoring HTN and DM. - Microalbumin, urine

## 2017-03-28 LAB — COMPREHENSIVE METABOLIC PANEL
A/G RATIO: 1.9 (ref 1.2–2.2)
ALT: 16 IU/L (ref 0–44)
AST: 18 IU/L (ref 0–40)
Albumin: 4.7 g/dL (ref 3.5–4.8)
Alkaline Phosphatase: 63 IU/L (ref 39–117)
BILIRUBIN TOTAL: 0.4 mg/dL (ref 0.0–1.2)
BUN/Creatinine Ratio: 18 (ref 10–24)
BUN: 32 mg/dL — AB (ref 8–27)
CALCIUM: 9.5 mg/dL (ref 8.6–10.2)
CHLORIDE: 97 mmol/L (ref 96–106)
CO2: 24 mmol/L (ref 20–29)
Creatinine, Ser: 1.8 mg/dL — ABNORMAL HIGH (ref 0.76–1.27)
GFR, EST AFRICAN AMERICAN: 43 mL/min/{1.73_m2} — AB (ref >59)
GFR, EST NON AFRICAN AMERICAN: 37 mL/min/{1.73_m2} — AB (ref >59)
GLUCOSE: 127 mg/dL — AB (ref 65–99)
Globulin, Total: 2.5 g/dL (ref 1.5–4.5)
POTASSIUM: 3.4 mmol/L — AB (ref 3.5–5.2)
Sodium: 139 mmol/L (ref 134–144)
TOTAL PROTEIN: 7.2 g/dL (ref 6.0–8.5)

## 2017-03-28 LAB — CBC WITH DIFFERENTIAL/PLATELET
BASOS ABS: 0 10*3/uL (ref 0.0–0.2)
Basos: 0 %
EOS (ABSOLUTE): 0.6 10*3/uL — AB (ref 0.0–0.4)
Eos: 5 %
Hematocrit: 42.1 % (ref 37.5–51.0)
Hemoglobin: 14.4 g/dL (ref 13.0–17.7)
IMMATURE GRANS (ABS): 0 10*3/uL (ref 0.0–0.1)
IMMATURE GRANULOCYTES: 0 %
LYMPHS: 16 %
Lymphocytes Absolute: 1.9 10*3/uL (ref 0.7–3.1)
MCH: 29.3 pg (ref 26.6–33.0)
MCHC: 34.2 g/dL (ref 31.5–35.7)
MCV: 86 fL (ref 79–97)
Monocytes Absolute: 1.3 10*3/uL — ABNORMAL HIGH (ref 0.1–0.9)
Monocytes: 11 %
NEUTROS PCT: 68 %
Neutrophils Absolute: 7.7 10*3/uL — ABNORMAL HIGH (ref 1.4–7.0)
PLATELETS: 341 10*3/uL (ref 150–379)
RBC: 4.91 x10E6/uL (ref 4.14–5.80)
RDW: 14.5 % (ref 12.3–15.4)
WBC: 11.5 10*3/uL — ABNORMAL HIGH (ref 3.4–10.8)

## 2017-03-28 LAB — LIPID PANEL
CHOLESTEROL TOTAL: 125 mg/dL (ref 100–199)
Chol/HDL Ratio: 2.7 ratio (ref 0.0–5.0)
HDL: 46 mg/dL (ref >39)
LDL CALC: 48 mg/dL (ref 0–99)
TRIGLYCERIDES: 154 mg/dL — AB (ref 0–149)
VLDL Cholesterol Cal: 31 mg/dL (ref 5–40)

## 2017-03-28 LAB — HEMOGLOBIN A1C
ESTIMATED AVERAGE GLUCOSE: 137 mg/dL
Hgb A1c MFr Bld: 6.4 % — ABNORMAL HIGH (ref 4.8–5.6)

## 2017-03-28 LAB — TSH: TSH: 2.16 u[IU]/mL (ref 0.450–4.500)

## 2017-03-28 LAB — MICROALBUMIN, URINE: MICROALBUM., U, RANDOM: 68.1 ug/mL

## 2017-03-29 NOTE — Assessment & Plan Note (Signed)
Appears well controlled. AWait labs.

## 2017-03-29 NOTE — Assessment & Plan Note (Signed)
Controlled. Continue current treatment. 

## 2017-03-29 NOTE — Assessment & Plan Note (Signed)
COntinue to control BP and glucose. Follow-up with nephrology per Dr. Etheleen Nicks recommendations.

## 2017-03-29 NOTE — Assessment & Plan Note (Signed)
Symptomatically euthyroid. Await lab results.

## 2017-04-02 DIAGNOSIS — E119 Type 2 diabetes mellitus without complications: Secondary | ICD-10-CM | POA: Diagnosis not present

## 2017-04-02 DIAGNOSIS — Z961 Presence of intraocular lens: Secondary | ICD-10-CM | POA: Diagnosis not present

## 2017-04-02 DIAGNOSIS — H40053 Ocular hypertension, bilateral: Secondary | ICD-10-CM | POA: Diagnosis not present

## 2017-04-12 DIAGNOSIS — E1122 Type 2 diabetes mellitus with diabetic chronic kidney disease: Secondary | ICD-10-CM | POA: Diagnosis not present

## 2017-04-12 DIAGNOSIS — I129 Hypertensive chronic kidney disease with stage 1 through stage 4 chronic kidney disease, or unspecified chronic kidney disease: Secondary | ICD-10-CM | POA: Diagnosis not present

## 2017-04-12 DIAGNOSIS — N183 Chronic kidney disease, stage 3 (moderate): Secondary | ICD-10-CM | POA: Diagnosis not present

## 2017-04-12 DIAGNOSIS — R809 Proteinuria, unspecified: Secondary | ICD-10-CM | POA: Diagnosis not present

## 2017-04-20 ENCOUNTER — Other Ambulatory Visit: Payer: Self-pay | Admitting: Physician Assistant

## 2017-04-21 ENCOUNTER — Other Ambulatory Visit: Payer: Self-pay | Admitting: Physician Assistant

## 2017-04-21 DIAGNOSIS — E039 Hypothyroidism, unspecified: Secondary | ICD-10-CM

## 2017-04-25 ENCOUNTER — Other Ambulatory Visit: Payer: Self-pay | Admitting: Emergency Medicine

## 2017-04-25 ENCOUNTER — Ambulatory Visit (INDEPENDENT_AMBULATORY_CARE_PROVIDER_SITE_OTHER): Payer: Medicare Other | Admitting: Internal Medicine

## 2017-04-25 ENCOUNTER — Encounter: Payer: Self-pay | Admitting: Internal Medicine

## 2017-04-25 VITALS — BP 126/88 | HR 64 | Ht 69.5 in | Wt 168.0 lb

## 2017-04-25 DIAGNOSIS — E118 Type 2 diabetes mellitus with unspecified complications: Secondary | ICD-10-CM

## 2017-04-25 DIAGNOSIS — E782 Mixed hyperlipidemia: Secondary | ICD-10-CM

## 2017-04-25 DIAGNOSIS — E1122 Type 2 diabetes mellitus with diabetic chronic kidney disease: Secondary | ICD-10-CM

## 2017-04-25 DIAGNOSIS — N183 Chronic kidney disease, stage 3 (moderate): Secondary | ICD-10-CM | POA: Diagnosis not present

## 2017-04-25 DIAGNOSIS — I10 Essential (primary) hypertension: Secondary | ICD-10-CM

## 2017-04-25 DIAGNOSIS — E1169 Type 2 diabetes mellitus with other specified complication: Secondary | ICD-10-CM | POA: Insufficient documentation

## 2017-04-25 MED ORDER — INSULIN PEN NEEDLE 31G X 8 MM MISC
1.0000 | Freq: Every day | 12 refills | Status: AC
Start: 2017-04-25 — End: ?

## 2017-04-25 NOTE — Patient Instructions (Signed)
Your physician wants you to follow-up in: ONE YEAR with Dr. Hilty. You will receive a reminder letter in the mail two months in advance. If you don't receive a letter, please call our office to schedule the follow-up appointment.  

## 2017-04-25 NOTE — Progress Notes (Signed)
OFFICE NOTE  Chief Complaint:  No complaints  Primary Care Physician: Harrison Mons, PA-C  HPI:  Bryan Knight is a pleasant 71 year old male who is coming referred to me for evaluation of uncontrolled hypertension. He says he's had high blood pressure for over 40 years and has had difficulty controlling it at times. He's been on a number of different medications however recently his blood pressures been difficult to control. In addition he was noted to have some chronic kidney disease with a creatinine of 1.6. Nephrology but cannot get an appointment for 6 months. Currently he is on a regimen of amlodipine 10 mg daily, lisinopril HCTZ 20/12.5 mg and metoprolol succinate 100 mg daily (which was just started by his PCP. He did bring a list of blood pressure readings at home which indicate recent blood pressures in the 170-200/90-100 range. He denies any symptoms with this. Currently his blood pressure is 158/96. He has had some shortness of breath with exertion but no chest pain. He's never had an ischemia workup. He has had 2 renal ultrasounds, last in 2012 which showed normal renal size. These were not renal artery Dopplers and therefore, I cannot comment on whether he could have renal artery stenosis.  Bryan Knight returns today for follow-up. Blood pressure still remains poorly controlled. He brought a list of medications and says that the only thing that seems to reduce the blood pressure acutely is clonidine. He was prescribed this by his nephrologist to use as needed for blood pressures over 180. In general his blood pressures are almost over 180 most of time. He reports his shortness of breath is improved somewhat. His echo showed diastolic dysfunction but normal systolic function. Stress testing was negative. He had renal Dopplers which showed normal renal size and parenchyma however renal blood flow was not assessed. I do not however suspect he has renal artery stenosis. He's had  long-standing hypertension which has been difficult to control. He says many years ago a Catapres patch was helpful but he had horrible dry mouth and was taken off of it.  04/21/16  Bryan Knight was seen today back in follow-up. Blood pressure is at goal today at 118/82. He had recent adjustment of his medicines, in fact he says that Dr. Mercy Moore decreased his lisinopril to 20 mg twice a day and stopped his clonidine. He brought in a list of blood pressures and they were fairly elevated. Apparently they've been at goal at his office and was at goal here, which makes me believe that his home blood pressure cuff may be inaccurate. I would like to set him up for a blood pressure check with our hypertension pharmacist and I've advised him to bring his blood pressure cuff to check for its calibration against our equipment.  04/25/2017  Bryan Knight returns today for follow-up. He is without complaints. He is disappointed that a lot of his doctors ever tired, but assured him that I would not be able to retire anytime soon. Blood pressure is now well controlled at 126/88. He is on however 5 medications for this. He reports stable chronic kidney disease which is present followed by Dr. Mercy Moore however he intends to retire. He had a low risk stress test in 2017 denies chest pain or worsening shortness of breath. His diabetes is well controlled and cholesterol has been at goal with LDL C of 42 recently.  PMHx:  Past Medical History:  Diagnosis Date  . Cataract   . Depression   .  Diabetes mellitus   . Glaucoma   . Hearing loss   . Hyperlipidemia   . Hypertension   . Rosacea   . Thyroid disease     Past Surgical History:  Procedure Laterality Date  . EYE SURGERY    . KNEE ARTHROSCOPY WITH MEDIAL MENISECTOMY Left 11/10/15  . TONSILLECTOMY     over 50 yrs ago    FAMHx:  Family History  Problem Relation Age of Onset  . Hypertension Mother   . Liver cancer Mother 32  . Hypertension Son     SOCHx:    reports that he quit smoking about 49 years ago. His smoking use included Cigarettes. He has a 2.00 pack-year smoking history. He has never used smokeless tobacco. He reports that he does not drink alcohol or use drugs.  ALLERGIES:  Allergies  Allergen Reactions  . Citalopram     Ineffective  . Tussionex Pennkinetic Er [Hydrocod Polst-Cpm Polst Er] Other (See Comments)    Foggy headed  . Wellbutrin [Bupropion Hcl] Other (See Comments)    Sluggish     ROS: Pertinent items noted in HPI and remainder of comprehensive ROS otherwise negative.  HOME MEDS: Current Outpatient Prescriptions  Medication Sig Dispense Refill  . Alcohol Swabs (ALCOHOL PREP) PADS Use daily for insulin injection 100 each prn  . amLODipine (NORVASC) 10 MG tablet Take 1 tablet (10 mg total) by mouth daily. 90 tablet 3  . aspirin 81 MG tablet Take 81 mg by mouth daily.     Marland Kitchen atorvastatin (LIPITOR) 20 MG tablet TAKE 1 TABLET (20 MG TOTAL) BY MOUTH DAILY. 90 tablet 0  . chlorthalidone (HYGROTON) 50 MG tablet Take 50 mg by mouth daily.  6  . DULoxetine (CYMBALTA) 30 MG capsule Take 1 capsule (30 mg total) by mouth daily. 90 capsule 3  . hydrALAZINE (APRESOLINE) 100 MG tablet Take 100 mg by mouth 3 (three) times daily.     . Insulin Glargine (BASAGLAR KWIKPEN) 100 UNIT/ML SOPN INJECT 0.1 MLS (10 UNITS TOTAL) INTO THE SKIN AT BEDTIME. 15 mL 0  . JANUVIA 100 MG tablet TAKE 1 TABLET DAILY 90 tablet 3  . levothyroxine (SYNTHROID, LEVOTHROID) 75 MCG tablet TAKE 1 TABLET (75 MCG TOTAL) BY MOUTH DAILY. 30 tablet 2  . lisinopril (PRINIVIL,ZESTRIL) 40 MG tablet Take 20 mg by mouth 2 (two) times daily.     . metFORMIN (GLUCOPHAGE) 500 MG tablet Take 1 tablet (500 mg total) by mouth 2 (two) times daily with a meal. 180 tablet 0  . metoprolol succinate (TOPROL-XL) 100 MG 24 hr tablet TAKE 1 TABLET (100 MG TOTAL) BY MOUTH DAILY. TAKE WITH OR IMMEDIATELY FOLLOWING A MEAL. 90 tablet 0  . montelukast (SINGULAIR) 10 MG tablet TAKE ONE  TABLET BY MOUTH NIGHTLY AT BEDTIME 90 tablet 3  . ONETOUCH DELICA LANCETS 02O MISC USE TO TEST ONCE DAILY AS DIRECTED**MAX DAYS SUPPLY 90** 100 each 0  . ONETOUCH VERIO test strip TEST BLOOD SUGAR ONCE DAILY. 100 each 3  . Insulin Pen Needle (EASY TOUCH PEN NEEDLES) 31G X 8 MM MISC 1 each by Does not apply route daily. 100 each 12   No current facility-administered medications for this visit.     LABS/IMAGING: No results found for this or any previous visit (from the past 48 hour(s)). No results found.  WEIGHTS: Wt Readings from Last 3 Encounters:  04/25/17 168 lb (76.2 kg)  03/27/17 168 lb 3.2 oz (76.3 kg)  01/12/17 167 lb (75.8 kg)  VITALS: BP 126/88 (BP Location: Left Arm, Patient Position: Sitting, Cuff Size: Normal)   Pulse 64   Ht 5' 9.5" (1.765 m)   Wt 168 lb (76.2 kg)   BMI 24.45 kg/m   EXAM: General appearance: alert and no distress Neck: no carotid bruit, no JVD and thyroid not enlarged, symmetric, no tenderness/mass/nodules Lungs: clear to auscultation bilaterally Heart: regular rate and rhythm, S1, S2 normal, no murmur, click, rub or gallop Abdomen: soft, non-tender; bowel sounds normal; no masses,  no organomegaly Extremities: extremities normal, atraumatic, no cyanosis or edema Pulses: 2+ and symmetric Skin: Skin color, texture, turgor normal. No rashes or lesions Neurologic: Grossly normal Psych: Pleasant  EKG: Sinus rhythm with PAC's at 64, left anterior fascicular block-personally reviewed  ASSESSMENT: 1. Hypertension - now at goal 2. Low risk nuclear stress test-echo with mild diastolic dysfunction (10/5364) 3. CKD 2 4. DM2 5. Dyslipidemia  PLAN: 1.   Bryan Knight is doing well with blood pressure that at goal. Chronic kidney disease been stable. Diabetes and cholesterol followed by his primary care provider however those are also at goal with LDL C of 42. No changes to her medications at this time plan to see him back annually or sooner as necessary  for risk factor modification.  Pixie Casino, MD, [facility] Attending Cardiologist [place] [facility] Elfrieda Espino 04/25/2017, 9:40 AM

## 2017-05-18 ENCOUNTER — Other Ambulatory Visit: Payer: Self-pay | Admitting: Physician Assistant

## 2017-05-18 DIAGNOSIS — E785 Hyperlipidemia, unspecified: Secondary | ICD-10-CM

## 2017-05-24 ENCOUNTER — Other Ambulatory Visit: Payer: Self-pay | Admitting: Physician Assistant

## 2017-05-24 DIAGNOSIS — E1165 Type 2 diabetes mellitus with hyperglycemia: Principal | ICD-10-CM

## 2017-05-24 DIAGNOSIS — IMO0001 Reserved for inherently not codable concepts without codable children: Secondary | ICD-10-CM

## 2017-05-26 ENCOUNTER — Other Ambulatory Visit: Payer: Self-pay | Admitting: Physician Assistant

## 2017-05-26 DIAGNOSIS — I517 Cardiomegaly: Secondary | ICD-10-CM

## 2017-05-26 DIAGNOSIS — I10 Essential (primary) hypertension: Secondary | ICD-10-CM

## 2017-05-28 ENCOUNTER — Other Ambulatory Visit: Payer: Self-pay | Admitting: *Deleted

## 2017-05-28 DIAGNOSIS — I10 Essential (primary) hypertension: Secondary | ICD-10-CM

## 2017-05-28 DIAGNOSIS — I517 Cardiomegaly: Secondary | ICD-10-CM

## 2017-05-28 MED ORDER — METOPROLOL SUCCINATE ER 100 MG PO TB24: 100.0000 mg | ORAL_TABLET | Freq: Every day | ORAL | 0 refills | Status: DC

## 2017-06-06 ENCOUNTER — Other Ambulatory Visit: Payer: Self-pay | Admitting: Physician Assistant

## 2017-06-06 DIAGNOSIS — E118 Type 2 diabetes mellitus with unspecified complications: Secondary | ICD-10-CM

## 2017-06-08 ENCOUNTER — Ambulatory Visit (INDEPENDENT_AMBULATORY_CARE_PROVIDER_SITE_OTHER): Payer: Medicare Other | Admitting: Podiatry

## 2017-06-08 ENCOUNTER — Encounter: Payer: Self-pay | Admitting: Podiatry

## 2017-06-08 DIAGNOSIS — M79676 Pain in unspecified toe(s): Secondary | ICD-10-CM | POA: Diagnosis not present

## 2017-06-08 DIAGNOSIS — B351 Tinea unguium: Secondary | ICD-10-CM | POA: Diagnosis not present

## 2017-06-08 DIAGNOSIS — E119 Type 2 diabetes mellitus without complications: Secondary | ICD-10-CM

## 2017-06-08 NOTE — Progress Notes (Signed)
Patient ID: Bryan Knight, male   DOB: [birth year], 71 y.o.   MRN: [id number]  Complaint:  Visit Type: Patient returns to my office for continued preventative foot care services. Complaint: Patient states" my nails have grown long and thick and become painful to walk and wear shoes" Patient has been diagnosed with DM with no complications. He presents for preventative foot care services. No changes to ROS  Podiatric Exam: Vascular: dorsalis pedis and posterior tibial pulses are palpable bilateral. Capillary return is immediate. Temperature gradient is WNL. Skin turgor WNL  Sensorium: Normal Semmes Weinstein monofilament test. Normal tactile sensation bilaterally. Nail Exam: Pt has thick disfigured discolored nails with subungual debris noted bilateral entire nail hallux through fifth toenails Ulcer Exam: There is no evidence of ulcer or pre-ulcerative changes or infection. Orthopedic Exam: Muscle tone and strength are WNL. No limitations in general ROM. No crepitus or effusions noted. Foot type and digits show no abnormalities. Bony prominences are unremarkable. Skin: No Porokeratosis. No infection or ulcers  Diagnosis:  Tinea unguium, Pain in right toe, pain in left toes  Treatment & Plan Procedures and Treatment: Consent by patient was obtained for treatment procedures. The patient understood the discussion of treatment and procedures well. All questions were answered thoroughly reviewed. Debridement of mycotic and hypertrophic toenails, 1 through 5 bilateral and clearing of subungual debris. No ulceration, no infection noted.  Return Visit-Office Procedure: Patient instructed to return to the office for a follow up visit 3 months for continued evaluation and treatment.   Gardiner Barefoot DPM

## 2017-06-21 ENCOUNTER — Ambulatory Visit (INDEPENDENT_AMBULATORY_CARE_PROVIDER_SITE_OTHER): Payer: Medicare Other

## 2017-06-21 VITALS — BP 150/78 | HR 60 | Ht 70.0 in | Wt 168.2 lb

## 2017-06-21 DIAGNOSIS — Z0000 Encounter for general adult medical examination without abnormal findings: Secondary | ICD-10-CM

## 2017-06-21 NOTE — Progress Notes (Signed)
Subjective:   Bryan Knight is a 71 y.o. male who presents for Medicare Annual/Subsequent preventive examination.  Review of Systems:  N/A Cardiac Risk Factors include: advanced age (>44men, >36 women);diabetes mellitus;male gender;hypertension;dyslipidemia     Objective:    Vitals: BP (!) 150/78   Pulse 60   Ht 5\' 10"  (1.778 m)   Wt 168 lb 4 oz (76.3 kg)   SpO2 95%   BMI 24.14 kg/m   Body mass index is 24.14 kg/m.  Tobacco History  Smoking Status  . Former Smoker  . Packs/day: 0.50  . Years: 4.00  . Types: Cigarettes  . Quit date: 10/18/1967  Smokeless Tobacco  . Never Used     Counseling given: Not Answered   Past Medical History:  Diagnosis Date  . Cataract   . Depression   . Diabetes mellitus   . Glaucoma   . Hearing loss   . Hyperlipidemia   . Hypertension   . Rosacea   . Thyroid disease    Past Surgical History:  Procedure Laterality Date  . EYE SURGERY    . KNEE ARTHROSCOPY WITH MEDIAL MENISECTOMY Left 11/10/15  . TONSILLECTOMY     over 50 yrs ago   Family History  Problem Relation Age of Onset  . Hypertension Mother   . Liver cancer Mother 49  . Hypertension Son    History  Sexual Activity  . Sexual activity: Yes  . Birth control/ protection: None    Comment: married    Outpatient Encounter Prescriptions as of 06/21/2017  Medication Sig  . Alcohol Swabs (ALCOHOL PREP) PADS Use daily for insulin injection  . amLODipine (NORVASC) 10 MG tablet Take 1 tablet (10 mg total) by mouth daily.  Marland Kitchen aspirin 81 MG tablet Take 81 mg by mouth daily.   Marland Kitchen atorvastatin (LIPITOR) 20 MG tablet TAKE 1 TABLET (20 MG TOTAL) BY MOUTH DAILY.  . chlorthalidone (HYGROTON) 50 MG tablet Take 50 mg by mouth daily.  . DULoxetine (CYMBALTA) 30 MG capsule Take 1 capsule (30 mg total) by mouth daily.  . hydrALAZINE (APRESOLINE) 100 MG tablet Take 100 mg by mouth 3 (three) times daily.   . Insulin Glargine (BASAGLAR KWIKPEN) 100 UNIT/ML SOPN INJECT 0.1 MLS (10 UNITS  TOTAL) INTO THE SKIN AT BEDTIME.  . Insulin Glargine (BASAGLAR KWIKPEN) 100 UNIT/ML SOPN INJECT 0.1 MLS (10 UNITS TOTAL) INTO THE SKIN AT BEDTIME.  . Insulin Pen Needle (EASY TOUCH PEN NEEDLES) 31G X 8 MM MISC 1 each by Does not apply route daily.  Marland Kitchen JANUVIA 100 MG tablet TAKE 1 TABLET DAILY  . levothyroxine (SYNTHROID, LEVOTHROID) 75 MCG tablet TAKE 1 TABLET (75 MCG TOTAL) BY MOUTH DAILY.  Marland Kitchen lisinopril (PRINIVIL,ZESTRIL) 40 MG tablet Take 20 mg by mouth 2 (two) times daily.   . metFORMIN (GLUCOPHAGE) 500 MG tablet TAKE 1 TABLET (500 MG TOTAL) BY MOUTH 2 (TWO) TIMES DAILY WITH A MEAL.  . metoprolol succinate (TOPROL-XL) 100 MG 24 hr tablet Take 1 tablet (100 mg total) by mouth daily. Take with or immediately following a meal.  . montelukast (SINGULAIR) 10 MG tablet TAKE ONE TABLET BY MOUTH NIGHTLY AT BEDTIME  . ONETOUCH DELICA LANCETS 16X MISC USE TO TEST ONCE DAILY AS DIRECTED**MAX DAYS SUPPLY 90**  . ONETOUCH VERIO test strip TEST BLOOD SUGAR ONCE DAILY.   No facility-administered encounter medications on file as of 06/21/2017.     Activities of Daily Living In your present state of health, do you have any difficulty  performing the following activities: 06/21/2017 01/12/2017  Hearing? Tempie Donning  Comment Patient wears hearing aids hearing aid  Vision? N N  Difficulty concentrating or making decisions? N N  Walking or climbing stairs? N N  Dressing or bathing? N N  Doing errands, shopping? N N  Preparing Food and eating ? N -  Using the Toilet? N -  In the past six months, have you accidently leaked urine? N -  Do you have problems with loss of bowel control? N -  Managing your Medications? N -  Managing your Finances? N -  Housekeeping or managing your Housekeeping? N -  Some recent data might be hidden    Patient Care Team: Harrison Mons, PA-C as PCP - General (Family Medicine) Debara Pickett Nadean Corwin, MD as Consulting Physician (Cardiology) Gardiner Barefoot, DPM as Consulting Physician  (Podiatry) Katy Fitch, Darlina Guys, MD as Consulting Physician (Ophthalmology) Marylynn Pearson, MD as Consulting Physician (Ophthalmology)   Assessment:     Exercise Activities and Dietary recommendations Current Exercise Habits: The patient does not participate in regular exercise at present, Exercise limited by: None identified  Goals    . Exercise 3x per week (30 min per time)          Patient states that he wants to try to start exercising and increasing his activity daily.       Fall Risk Fall Risk  06/21/2017 03/27/2017 01/12/2017 12/26/2016 09/26/2016  Falls in the past year? No No No No No   Depression Screen PHQ 2/9 Scores 06/21/2017 03/27/2017 01/12/2017 12/26/2016  PHQ - 2 Score 0 0 0 0  PHQ- 9 Score 0 - - -    Cognitive Function     6CIT Screen 06/21/2017  What Year? 0 points  What month? 0 points  What time? 0 points  Count back from 20 0 points  Months in reverse 0 points  Repeat phrase 0 points  Total Score 0    Immunization History  Administered Date(s) Administered  . Influenza Split 06/02/2013, 05/10/2015  . Influenza-Unspecified 04/28/2014, 05/10/2015, 05/01/2016  . Pneumococcal Conjugate-13 11/26/2014  . Pneumococcal Polysaccharide-23 02/15/2016  . Zoster Recombinat (Shingrix) 12/28/2016, 03/05/2017   Screening Tests Health Maintenance  Topic Date Due  . TETANUS/TDAP  09/26/2017 (Originally 09/18/2016)  . COLONOSCOPY  12/26/2017 (Originally 11/01/2013)  . HEMOGLOBIN A1C  09/27/2017  . OPHTHALMOLOGY EXAM  03/19/2018  . FOOT EXAM  03/27/2018  . INFLUENZA VACCINE  Completed  . Hepatitis C Screening  Completed  . PNA vac Low Risk Adult  Completed      Plan:   I have personally reviewed and noted the following in the patient's chart:   . Medical and social history . Use of alcohol, tobacco or illicit drugs  . Current medications and supplements . Functional ability and status . Nutritional status . Physical activity . Advanced directives . List of  other physicians . Hospitalizations, surgeries, and ER visits in previous 12 months . Vitals . Screenings to include cognitive, depression, and falls . Referrals and appointments  In addition, I have reviewed and discussed with patient certain preventive protocols, quality metrics, and best practice recommendations. A written personalized care plan for preventive services as well as general preventive health recommendations were provided to patient.  Patient declined colonoscopy.    Andrez Grime, LPN  75/09/256

## 2017-06-21 NOTE — Patient Instructions (Addendum)
Bryan Knight , Thank you for taking time to come for your Medicare Wellness Visit. I appreciate your ongoing commitment to your health goals. Please review the following plan we discussed and let me know if I can assist you in the future.   Screening recommendations/referrals: Colonoscopy: declined  Recommended yearly ophthalmology/optometry visit for glaucoma screening and checkup Recommended yearly dental visit for hygiene and checkup  Vaccinations: Influenza vaccine: up to date, Received at pharmacy on 05/02/2017 Pneumococcal vaccine: up to date Tdap vaccine: due, declined due to insurance Shingles vaccine: up to date    Advanced directives: Advance directive discussed with you today. Even though you declined this today please call our office should you change your mind and we can give you the proper paperwork for you to fill out.   Conditions/risks identified: Try to start exercising and increasing your activity daily.   Next appointment: 06/29/17 @ 9:20 am with [place] 65 Years and Older, Male Preventive care refers to lifestyle choices and visits with your health care provider that can promote health and wellness. What does preventive care include?  A yearly physical exam. This is also called an annual well check.  Dental exams once or twice a year.  Routine eye exams. Ask your health care provider how often you should have your eyes checked.  Personal lifestyle choices, including:  Daily care of your teeth and gums.  Regular physical activity.  Eating a healthy diet.  Avoiding tobacco and drug use.  Limiting alcohol use.  Practicing safe sex.  Taking low doses of aspirin every day.  Taking vitamin and mineral supplements as recommended by your health care provider. What happens during an annual well check? The services and screenings done by your health care provider during your annual well check will depend on your age, overall health,  lifestyle risk factors, and family history of disease. Counseling  Your health care provider may ask you questions about your:  Alcohol use.  Tobacco use.  Drug use.  Emotional well-being.  Home and relationship well-being.  Sexual activity.  Eating habits.  History of falls.  Memory and ability to understand (cognition).  Work and work Statistician. Screening  You may have the following tests or measurements:  Height, weight, and BMI.  Blood pressure.  Lipid and cholesterol levels. These may be checked every 5 years, or more frequently if you are over 5 years old.  Skin check.  Lung cancer screening. You may have this screening every year starting at age 43 if you have a 30-pack-year history of smoking and currently smoke or have quit within the past 15 years.  Fecal occult blood test (FOBT) of the stool. You may have this test every year starting at age 24.  Flexible sigmoidoscopy or colonoscopy. You may have a sigmoidoscopy every 5 years or a colonoscopy every 10 years starting at age 55.  Prostate cancer screening. Recommendations will vary depending on your family history and other risks.  Hepatitis C blood test.  Hepatitis B blood test.  Sexually transmitted disease (STD) testing.  Diabetes screening. This is done by checking your blood sugar (glucose) after you have not eaten for a while (fasting). You may have this done every 1-3 years.  Abdominal aortic aneurysm (AAA) screening. You may need this if you are a current or former smoker.  Osteoporosis. You may be screened starting at age 36 if you are at high risk. Talk with your health care provider about your test results, treatment  options, and if necessary, the need for more tests. Vaccines  Your health care provider may recommend certain vaccines, such as:  Influenza vaccine. This is recommended every year.  Tetanus, diphtheria, and acellular pertussis (Tdap, Td) vaccine. You may need a Td booster  every 10 years.  Zoster vaccine. You may need this after age 90.  Pneumococcal 13-valent conjugate (PCV13) vaccine. One dose is recommended after age 12.  Pneumococcal polysaccharide (PPSV23) vaccine. One dose is recommended after age 50. Talk to your health care provider about which screenings and vaccines you need and how often you need them. This information is not intended to replace advice given to you by your health care provider. Make sure you discuss any questions you have with your health care provider. Document Released: 10/01/2015 Document Revised: 05/24/2016 Document Reviewed: 07/06/2015 Elsevier Interactive Patient Education  2017 [place] Prevention in the Home Falls can cause injuries. They can happen to people of all ages. There are many things you can do to make your home safe and to help prevent falls. What can I do on the outside of my home?  Regularly fix the edges of walkways and driveways and fix any cracks.  Remove anything that might make you trip as you walk through a door, such as a raised step or threshold.  Trim any bushes or trees on the path to your home.  Use bright outdoor lighting.  Clear any walking paths of anything that might make someone trip, such as rocks or tools.  Regularly check to see if handrails are loose or broken. Make sure that both sides of any steps have handrails.  Any raised decks and porches should have guardrails on the edges.  Have any leaves, snow, or ice cleared regularly.  Use sand or salt on walking paths during winter.  Clean up any spills in your garage right away. This includes oil or grease spills. What can I do in the bathroom?  Use night lights.  Install grab bars by the toilet and in the tub and shower. Do not use towel bars as grab bars.  Use non-skid mats or decals in the tub or shower.  If you need to sit down in the shower, use a plastic, non-slip stool.  Keep the floor dry. Clean up any  water that spills on the floor as soon as it happens.  Remove soap buildup in the tub or shower regularly.  Attach bath mats securely with double-sided non-slip rug tape.  Do not have throw rugs and other things on the floor that can make you trip. What can I do in the bedroom?  Use night lights.  Make sure that you have a light by your bed that is easy to reach.  Do not use any sheets or blankets that are too big for your bed. They should not hang down onto the floor.  Have a firm chair that has side arms. You can use this for support while you get dressed.  Do not have throw rugs and other things on the floor that can make you trip. What can I do in the kitchen?  Clean up any spills right away.  Avoid walking on wet floors.  Keep items that you use a lot in easy-to-reach places.  If you need to reach something above you, use a strong step stool that has a grab bar.  Keep electrical cords out of the way.  Do not use floor polish or wax that makes floors slippery.  If you must use wax, use non-skid floor wax.  Do not have throw rugs and other things on the floor that can make you trip. What can I do with my stairs?  Do not leave any items on the stairs.  Make sure that there are handrails on both sides of the stairs and use them. Fix handrails that are broken or loose. Make sure that handrails are as long as the stairways.  Check any carpeting to make sure that it is firmly attached to the stairs. Fix any carpet that is loose or worn.  Avoid having throw rugs at the top or bottom of the stairs. If you do have throw rugs, attach them to the floor with carpet tape.  Make sure that you have a light switch at the top of the stairs and the bottom of the stairs. If you do not have them, ask someone to add them for you. What else can I do to help prevent falls?  Wear shoes that:  Do not have high heels.  Have rubber bottoms.  Are comfortable and fit you well.  Are closed  at the toe. Do not wear sandals.  If you use a stepladder:  Make sure that it is fully opened. Do not climb a closed stepladder.  Make sure that both sides of the stepladder are locked into place.  Ask someone to hold it for you, if possible.  Clearly mark and make sure that you can see:  Any grab bars or handrails.  First and last steps.  Where the edge of each step is.  Use tools that help you move around (mobility aids) if they are needed. These include:  Canes.  Walkers.  Scooters.  Crutches.  Turn on the lights when you go into a dark area. Replace any light bulbs as soon as they burn out.  Set up your furniture so you have a clear path. Avoid moving your furniture around.  If any of your floors are uneven, fix them.  If there are any pets around you, be aware of where they are.  Review your medicines with your doctor. Some medicines can make you feel dizzy. This can increase your chance of falling. Ask your doctor what other things that you can do to help prevent falls. This information is not intended to replace advice given to you by your health care provider. Make sure you discuss any questions you have with your health care provider. Document Released: 07/01/2009 Document Revised: 02/10/2016 Document Reviewed: 10/09/2014 Elsevier Interactive Patient Education  2017 [employer].

## 2017-06-29 ENCOUNTER — Encounter: Payer: Self-pay | Admitting: Physician Assistant

## 2017-06-29 ENCOUNTER — Ambulatory Visit (INDEPENDENT_AMBULATORY_CARE_PROVIDER_SITE_OTHER): Payer: Medicare Other | Admitting: Physician Assistant

## 2017-06-29 VITALS — BP 118/70 | HR 67 | Temp 97.7°F | Resp 18 | Ht 70.0 in | Wt 172.4 lb

## 2017-06-29 DIAGNOSIS — E039 Hypothyroidism, unspecified: Secondary | ICD-10-CM | POA: Diagnosis not present

## 2017-06-29 DIAGNOSIS — N183 Chronic kidney disease, stage 3 unspecified: Secondary | ICD-10-CM

## 2017-06-29 DIAGNOSIS — E782 Mixed hyperlipidemia: Secondary | ICD-10-CM | POA: Diagnosis not present

## 2017-06-29 DIAGNOSIS — E118 Type 2 diabetes mellitus with unspecified complications: Secondary | ICD-10-CM | POA: Diagnosis not present

## 2017-06-29 DIAGNOSIS — I10 Essential (primary) hypertension: Secondary | ICD-10-CM | POA: Diagnosis not present

## 2017-06-29 DIAGNOSIS — E1122 Type 2 diabetes mellitus with diabetic chronic kidney disease: Secondary | ICD-10-CM

## 2017-06-29 DIAGNOSIS — Z125 Encounter for screening for malignant neoplasm of prostate: Secondary | ICD-10-CM

## 2017-06-29 DIAGNOSIS — I77811 Abdominal aortic ectasia: Secondary | ICD-10-CM | POA: Diagnosis not present

## 2017-06-29 NOTE — Assessment & Plan Note (Signed)
Clinically euthyroid. Recent labs were normal.

## 2017-06-29 NOTE — Progress Notes (Signed)
Patient ID: Bryan Knight, male    DOB: Jan 26, 1946, 71 y.o.   MRN: [id number]  PCP: Harrison Mons, PA-C  Chief Complaint  Patient presents with  . Annual Exam    Subjective:   Presents for evaluation of HTN, diabetes. His benefit plan doesn't cover a physical each year.  He had his Medicare Annual Wellness Visit earlier this month. His nephrologist has retired, and he is scheduled to establish with a new nephrologist-Dr. Joelyn Oms, next week.  Home BP log reviewed. 125-145/55-61 Home glucose averages 130's Tolerating his medications well. He's shocked at how well controlled his BP is here today, doesn't think he's ever seen it that low.  Overall, he feels good. "Why did it take until I'm 71?! I mean, I'm not gonna make it much longer!"  Colorectal Cancer Screening: 2005, plans to do this in the spring. Prostate Cancer Screening: No PSA on file. Discussed risks and benefits of screening. He'd like to proceed. Bone Density Testing: never HIV Screening: no longer a candidate. Very low risk. STI Screening: very low risk. Seasonal Influenza Vaccination: completed at his local pharmacy Td/Tdap Vaccination: due this year, but not covered by his benefit plan. Pneumococcal Vaccination: series complete Zoster Vaccination: complete Frequency of Dental evaluation: Q6 months Frequency of Eye evaluation: annually    Review of Systems Denies chest pain, shortness of breath, HA, dizziness, vision change, nausea, vomiting, diarrhea, constipation, melena, hematochezia, dysuria, increased urinary urgency or frequency, increased hunger or thirst, unintentional weight change, unexplained myalgias or arthralgias, rash.     Patient Active Problem List   Diagnosis Date Noted  . Mixed hyperlipidemia 04/25/2017  . Rhinophyma 03/27/2017  . Abdominal aortic ectasia ([place]) 07/05/2016  . Shortness of breath 04/21/2016  . CKD stage 3 due to type 2 diabetes mellitus ([place]) 04/05/2016  . Knee  pain, left 10/19/2015  . Diastolic dysfunction 37/90/2409  . Microalbuminuria 04/02/2015  . Depression 04/02/2015  . B12 deficiency 01/11/2012  . Hypertension 10/20/2011  . Type 2 diabetes mellitus with complication, without long-term current use of insulin ([place]) 10/20/2011  . Hypothyroid 10/20/2011  . Rosacea 10/20/2011     Prior to Admission medications   Medication Sig Start Date End Date Taking? Authorizing Provider  Alcohol Swabs (ALCOHOL PREP) PADS Use daily for insulin injection 06/09/16  Yes Rozella Servello, PA-C  amLODipine (NORVASC) 10 MG tablet Take 1 tablet (10 mg total) by mouth daily. 08/17/16  Yes Patina Spanier, PA-C  aspirin 81 MG tablet Take 81 mg by mouth daily.    Yes [provider]  atorvastatin (LIPITOR) 20 MG tablet TAKE 1 TABLET (20 MG TOTAL) BY MOUTH DAILY. 05/19/17  Yes Melquisedec Journey, PA-C  chlorthalidone (HYGROTON) 50 MG tablet Take 50 mg by mouth daily. 12/03/15  Yes [provider]  DULoxetine (CYMBALTA) 30 MG capsule Take 1 capsule (30 mg total) by mouth daily. 11/13/16  Yes Mattalynn Crandle, PA-C  hydrALAZINE (APRESOLINE) 100 MG tablet Take 100 mg by mouth 3 (three) times daily.  05/29/16  Yes [provider]  Insulin Glargine (BASAGLAR KWIKPEN) 100 UNIT/ML SOPN INJECT 0.1 MLS (10 UNITS TOTAL) INTO THE SKIN AT BEDTIME. 02/19/17  Yes Breeona Waid, PA-C  Insulin Glargine (BASAGLAR KWIKPEN) 100 UNIT/ML SOPN INJECT 0.1 MLS (10 UNITS TOTAL) INTO THE SKIN AT BEDTIME. 06/07/17  Yes Nathanyl Andujo, PA-C  Insulin Pen Needle (EASY TOUCH PEN NEEDLES) 31G X 8 MM MISC 1 each by Does not apply route daily. 04/25/17  Yes Harrison Mons, PA-C  JANUVIA 100 MG tablet TAKE 1 TABLET DAILY 01/18/17  Yes Chandria Rookstool, PA-C  levothyroxine (SYNTHROID, LEVOTHROID) 75 MCG tablet TAKE 1 TABLET (75 MCG TOTAL) BY MOUTH DAILY. 04/23/17  Yes Levelle Edelen, PA-C  lisinopril (PRINIVIL,ZESTRIL) 40 MG tablet Take 20 mg by mouth 2 (two) times daily.  04/17/16  Yes  [provider]  metFORMIN (GLUCOPHAGE) 500 MG tablet TAKE 1 TABLET (500 MG TOTAL) BY MOUTH 2 (TWO) TIMES DAILY WITH A MEAL. 05/25/17  Yes Gokul Waybright, PA-C  metoprolol succinate (TOPROL-XL) 100 MG 24 hr tablet Take 1 tablet (100 mg total) by mouth daily. Take with or immediately following a meal. 05/28/17  Yes Naiomy Watters, PA-C  montelukast (SINGULAIR) 10 MG tablet TAKE ONE TABLET BY MOUTH NIGHTLY AT BEDTIME 03/20/17  Yes Cedar Ditullio, PA-C  ONETOUCH DELICA LANCETS 56E MISC USE TO TEST ONCE DAILY AS DIRECTED**MAX DAYS SUPPLY 90** 08/03/16  Yes Lillyth Spong, PA-C  ONETOUCH VERIO test strip TEST BLOOD SUGAR ONCE DAILY. 08/29/16  Yes Harrison Mons, PA-C     Allergies  Allergen Reactions  . Citalopram     Ineffective  . Tussionex Pennkinetic Er [Hydrocod Polst-Cpm Polst Er] Other (See Comments)    Foggy headed  . Wellbutrin [Bupropion Hcl] Other (See Comments)    Sluggish        Objective:  Physical Exam  Constitutional: He is oriented to person, place, and time. He appears well-developed and well-nourished. He is active and cooperative. No distress.  BP 118/70 (BP Location: Left Arm, Patient Position: Sitting, Cuff Size: Normal)   Pulse 67   Temp 97.7 F (36.5 C) (Oral)   Resp 18   Ht 5\' 10"  (1.778 m)   Wt 172 lb 6.4 oz (78.2 kg)   SpO2 98%   BMI 24.74 kg/m   HENT:  Head: Normocephalic and atraumatic.  Right Ear: Hearing normal.  Left Ear: Hearing normal.  Eyes: Conjunctivae are normal. No scleral icterus.  Neck: Normal range of motion. Neck supple. No thyromegaly present.  Cardiovascular: Normal rate, regular rhythm and normal heart sounds.   Pulses:      Radial pulses are 2+ on the right side, and 2+ on the left side.  Pulmonary/Chest: Effort normal and breath sounds normal.  Lymphadenopathy:       Head (right side): No tonsillar, no preauricular, no posterior auricular and no occipital adenopathy present.       Head (left side): No tonsillar, no  preauricular, no posterior auricular and no occipital adenopathy present.    He has no cervical adenopathy.       Right: No supraclavicular adenopathy present.       Left: No supraclavicular adenopathy present.  Neurological: He is alert and oriented to person, place, and time. No sensory deficit.  Skin: Skin is warm, dry and intact. No rash noted. No cyanosis or erythema. Nails show no clubbing.  Psychiatric: He has a normal mood and affect. His speech is normal and behavior is normal.           Assessment & Plan:   Problem List Items Addressed This Visit    Hypertension - Primary    Well controlled. Continue current treatment.      Relevant Orders   CBC with Differential/Platelet   Comprehensive metabolic panel   Type 2 diabetes mellitus with complication, without long-term current use of insulin (HCC)    Controlled based on home readings. Await lab results.      Relevant Orders   Comprehensive metabolic panel  Hemoglobin A1c   Hypothyroid    Clinically euthyroid. Recent labs were normal.      CKD stage 3 due to type 2 diabetes mellitus ([place])    Continue efforts to control HTN and diabetes.      Relevant Orders   CBC with Differential/Platelet   Comprehensive metabolic panel   Abdominal aortic ectasia ([place])    Repeat US in 2022.      Mixed hyperlipidemia    Goal LDL <70      Relevant Orders   Comprehensive metabolic panel   Lipid panel    Other Visit Diagnoses    Screening for prostate cancer       Relevant Orders   PSA       Return in about 3 months (around 09/29/2017) for re-evalaution of diabetes, blood pressure, etc.   Fara Chute, PA-C [facility]

## 2017-06-29 NOTE — Assessment & Plan Note (Signed)
Goal LDL <70

## 2017-06-29 NOTE — Assessment & Plan Note (Signed)
Controlled based on home readings. Await lab results.

## 2017-06-29 NOTE — Assessment & Plan Note (Signed)
Repeat US in 2022.

## 2017-06-29 NOTE — Assessment & Plan Note (Signed)
Continue efforts to control HTN and diabetes.

## 2017-06-29 NOTE — Patient Instructions (Addendum)
   IF you received an x-ray today, you will receive an invoice from [facility]. Please contact [facility] at [phone] with questions or concerns regarding your invoice.   IF you received labwork today, you will receive an invoice from [facility]. Please contact [facility] at [phone] with questions or concerns regarding your invoice.   Our billing staff will not be able to assist you with questions regarding bills from these companies.  You will be contacted with the lab results as soon as they are available. The fastest way to get your results is to activate your My Chart account. Instructions are located on the last page of this paperwork. If you have not heard from us regarding the results in 2 weeks, please contact this office.      Preventive Care 65 Years and Older, Male Preventive care refers to lifestyle choices and visits with your health care provider that can promote health and wellness. What does preventive care include?  A yearly physical exam. This is also called an annual well check.  Dental exams once or twice a year.  Routine eye exams. Ask your health care provider how often you should have your eyes checked.  Personal lifestyle choices, including: ? Daily care of your teeth and gums. ? Regular physical activity. ? Eating a healthy diet. ? Avoiding tobacco and drug use. ? Limiting alcohol use. ? Practicing safe sex. ? Taking low doses of aspirin every day. ? Taking vitamin and mineral supplements as recommended by your health care provider. What happens during an annual well check? The services and screenings done by your health care provider during your annual well check will depend on your age, overall health, lifestyle risk factors, and family history of disease. Counseling Your health care provider may ask you questions about your:  Alcohol use.  Tobacco use.  Drug use.  Emotional well-being.  Home and relationship  well-being.  Sexual activity.  Eating habits.  History of falls.  Memory and ability to understand (cognition).  Work and work environment.  Screening You may have the following tests or measurements:  Height, weight, and BMI.  Blood pressure.  Lipid and cholesterol levels. These may be checked every 5 years, or more frequently if you are over 50 years old.  Skin check.  Lung cancer screening. You may have this screening every year starting at age 55 if you have a 30-pack-year history of smoking and currently smoke or have quit within the past 15 years.  Fecal occult blood test (FOBT) of the stool. You may have this test every year starting at age 50.  Flexible sigmoidoscopy or colonoscopy. You may have a sigmoidoscopy every 5 years or a colonoscopy every 10 years starting at age 50.  Prostate cancer screening. Recommendations will vary depending on your family history and other risks.  Hepatitis C blood test.  Hepatitis B blood test.  Sexually transmitted disease (STD) testing.  Diabetes screening. This is done by checking your blood sugar (glucose) after you have not eaten for a while (fasting). You may have this done every 1-3 years.  Abdominal aortic aneurysm (AAA) screening. You may need this if you are a current or former smoker.  Osteoporosis. You may be screened starting at age 70 if you are at high risk.  Talk with your health care provider about your test results, treatment options, and if necessary, the need for more tests. Vaccines Your health care provider may recommend certain vaccines, such as:  Influenza vaccine. This   is recommended every year.  Tetanus, diphtheria, and acellular pertussis (Tdap, Td) vaccine. You may need a Td booster every 10 years.  Varicella vaccine. You may need this if you have not been vaccinated.  Zoster vaccine. You may need this after age 60.  Measles, mumps, and rubella (MMR) vaccine. You may need at least one dose of  MMR if you were born in 1957 or later. You may also need a second dose.  Pneumococcal 13-valent conjugate (PCV13) vaccine. One dose is recommended after age 65.  Pneumococcal polysaccharide (PPSV23) vaccine. One dose is recommended after age 65.  Meningococcal vaccine. You may need this if you have certain conditions.  Hepatitis A vaccine. You may need this if you have certain conditions or if you travel or work in places where you may be exposed to hepatitis A.  Hepatitis B vaccine. You may need this if you have certain conditions or if you travel or work in places where you may be exposed to hepatitis B.  Haemophilus influenzae type b (Hib) vaccine. You may need this if you have certain risk factors.  Talk to your health care provider about which screenings and vaccines you need and how often you need them. This information is not intended to replace advice given to you by your health care provider. Make sure you discuss any questions you have with your health care provider. Document Released: 10/01/2015 Document Revised: 05/24/2016 Document Reviewed: 07/06/2015 Elsevier Interactive Patient Education  2017 [employer]  

## 2017-06-29 NOTE — Assessment & Plan Note (Signed)
Well controlled. Continue current treatment. 

## 2017-07-01 LAB — COMPREHENSIVE METABOLIC PANEL
A/G RATIO: 1.9 (ref 1.2–2.2)
ALBUMIN: 4.4 g/dL (ref 3.5–4.8)
ALT: 20 IU/L (ref 0–44)
AST: 19 IU/L (ref 0–40)
Alkaline Phosphatase: 63 IU/L (ref 39–117)
BUN / CREAT RATIO: 12 (ref 10–24)
BUN: 27 mg/dL (ref 8–27)
Bilirubin Total: 0.4 mg/dL (ref 0.0–1.2)
CALCIUM: 10.3 mg/dL — AB (ref 8.6–10.2)
CO2: 26 mmol/L (ref 20–29)
CREATININE: 2.24 mg/dL — AB (ref 0.76–1.27)
Chloride: 97 mmol/L (ref 96–106)
GFR, EST AFRICAN AMERICAN: 33 mL/min/{1.73_m2} — AB (ref >59)
GFR, EST NON AFRICAN AMERICAN: 28 mL/min/{1.73_m2} — AB (ref >59)
GLOBULIN, TOTAL: 2.3 g/dL (ref 1.5–4.5)
Glucose: 126 mg/dL — ABNORMAL HIGH (ref 65–99)
Potassium: 3.4 mmol/L — ABNORMAL LOW (ref 3.5–5.2)
Sodium: 140 mmol/L (ref 134–144)
Total Protein: 6.7 g/dL (ref 6.0–8.5)

## 2017-07-01 LAB — CBC WITH DIFFERENTIAL/PLATELET
BASOS ABS: 0 10*3/uL (ref 0.0–0.2)
Basos: 0 %
EOS (ABSOLUTE): 0.5 10*3/uL — ABNORMAL HIGH (ref 0.0–0.4)
EOS: 4 %
HEMATOCRIT: 42.4 % (ref 37.5–51.0)
Hemoglobin: 14.6 g/dL (ref 13.0–17.7)
IMMATURE GRANULOCYTES: 0 %
Immature Grans (Abs): 0 10*3/uL (ref 0.0–0.1)
LYMPHS ABS: 1.6 10*3/uL (ref 0.7–3.1)
Lymphs: 15 %
MCH: 29.5 pg (ref 26.6–33.0)
MCHC: 34.4 g/dL (ref 31.5–35.7)
MCV: 86 fL (ref 79–97)
MONOCYTES: 10 %
MONOS ABS: 1.1 10*3/uL — AB (ref 0.1–0.9)
NEUTROS PCT: 71 %
Neutrophils Absolute: 7.7 10*3/uL — ABNORMAL HIGH (ref 1.4–7.0)
Platelets: 308 10*3/uL (ref 150–379)
RBC: 4.95 x10E6/uL (ref 4.14–5.80)
RDW: 14.1 % (ref 12.3–15.4)
WBC: 10.9 10*3/uL — AB (ref 3.4–10.8)

## 2017-07-01 LAB — LIPID PANEL
CHOLESTEROL TOTAL: 130 mg/dL (ref 100–199)
Chol/HDL Ratio: 3 ratio (ref 0.0–5.0)
HDL: 44 mg/dL (ref >39)
LDL Calculated: 53 mg/dL (ref 0–99)
Triglycerides: 166 mg/dL — ABNORMAL HIGH (ref 0–149)
VLDL CHOLESTEROL CAL: 33 mg/dL (ref 5–40)

## 2017-07-01 LAB — HEMOGLOBIN A1C
ESTIMATED AVERAGE GLUCOSE: 148 mg/dL
Hgb A1c MFr Bld: 6.8 % — ABNORMAL HIGH (ref 4.8–5.6)

## 2017-07-01 LAB — PSA: PROSTATE SPECIFIC AG, SERUM: 2.5 ng/mL (ref 0.0–4.0)

## 2017-07-02 ENCOUNTER — Encounter: Payer: Self-pay | Admitting: Physician Assistant

## 2017-07-05 DIAGNOSIS — E119 Type 2 diabetes mellitus without complications: Secondary | ICD-10-CM | POA: Diagnosis not present

## 2017-07-05 DIAGNOSIS — N183 Chronic kidney disease, stage 3 (moderate): Secondary | ICD-10-CM | POA: Diagnosis not present

## 2017-07-05 DIAGNOSIS — R809 Proteinuria, unspecified: Secondary | ICD-10-CM | POA: Diagnosis not present

## 2017-07-05 DIAGNOSIS — I10 Essential (primary) hypertension: Secondary | ICD-10-CM | POA: Diagnosis not present

## 2017-07-19 ENCOUNTER — Other Ambulatory Visit: Payer: Self-pay | Admitting: Physician Assistant

## 2017-07-19 DIAGNOSIS — E039 Hypothyroidism, unspecified: Secondary | ICD-10-CM

## 2017-07-24 ENCOUNTER — Other Ambulatory Visit: Payer: Self-pay | Admitting: Physician Assistant

## 2017-07-24 DIAGNOSIS — E039 Hypothyroidism, unspecified: Secondary | ICD-10-CM

## 2017-08-19 ENCOUNTER — Other Ambulatory Visit: Payer: Self-pay | Admitting: Physician Assistant

## 2017-08-19 DIAGNOSIS — IMO0001 Reserved for inherently not codable concepts without codable children: Secondary | ICD-10-CM

## 2017-08-19 DIAGNOSIS — E785 Hyperlipidemia, unspecified: Secondary | ICD-10-CM

## 2017-08-19 DIAGNOSIS — E1165 Type 2 diabetes mellitus with hyperglycemia: Secondary | ICD-10-CM

## 2017-08-21 ENCOUNTER — Other Ambulatory Visit: Payer: Self-pay | Admitting: Physician Assistant

## 2017-09-02 ENCOUNTER — Other Ambulatory Visit: Payer: Self-pay | Admitting: Physician Assistant

## 2017-09-02 DIAGNOSIS — I517 Cardiomegaly: Secondary | ICD-10-CM

## 2017-09-02 DIAGNOSIS — I10 Essential (primary) hypertension: Secondary | ICD-10-CM

## 2017-09-03 ENCOUNTER — Other Ambulatory Visit: Payer: Self-pay | Admitting: Physician Assistant

## 2017-09-07 ENCOUNTER — Encounter: Payer: Self-pay | Admitting: Podiatry

## 2017-09-07 ENCOUNTER — Ambulatory Visit (INDEPENDENT_AMBULATORY_CARE_PROVIDER_SITE_OTHER): Payer: Medicare Other | Admitting: Podiatry

## 2017-09-07 DIAGNOSIS — M79676 Pain in unspecified toe(s): Secondary | ICD-10-CM

## 2017-09-07 DIAGNOSIS — E119 Type 2 diabetes mellitus without complications: Secondary | ICD-10-CM

## 2017-09-07 DIAGNOSIS — B351 Tinea unguium: Secondary | ICD-10-CM

## 2017-09-07 NOTE — Progress Notes (Signed)
Patient ID: Bryan Knight, male   DOB: 29-Jun-1946, 71 y.o.   MRN: [id number]  Complaint:  Visit Type: Patient returns to my office for continued preventative foot care services. Complaint: Patient states" my nails have grown long and thick and become painful to walk and wear shoes" Patient has been diagnosed with DM with no complications. He presents for preventative foot care services. No changes to ROS  Podiatric Exam: Vascular: dorsalis pedis and posterior tibial pulses are palpable bilateral. Capillary return is immediate. Temperature gradient is WNL. Skin turgor WNL  Sensorium: Normal Semmes Weinstein monofilament test. Normal tactile sensation bilaterally. Nail Exam: Pt has thick disfigured discolored nails with subungual debris noted bilateral entire nail hallux through fifth toenails Ulcer Exam: There is no evidence of ulcer or pre-ulcerative changes or infection. Orthopedic Exam: Muscle tone and strength are WNL. No limitations in general ROM. No crepitus or effusions noted. Foot type and digits show no abnormalities. Bony prominences are unremarkable. Skin: No Porokeratosis. No infection or ulcers  Diagnosis:  Tinea unguium, Pain in right toe, pain in left toes  Treatment & Plan Procedures and Treatment: Consent by patient was obtained for treatment procedures. The patient understood the discussion of treatment and procedures well. All questions were answered thoroughly reviewed. Debridement of mycotic and hypertrophic toenails, 1 through 5 bilateral and clearing of subungual debris. No ulceration, no infection noted.  Return Visit-Office Procedure: Patient instructed to return to the office for a follow up visit 3 months for continued evaluation and treatment.   Gardiner Barefoot DPM

## 2017-09-17 ENCOUNTER — Ambulatory Visit (INDEPENDENT_AMBULATORY_CARE_PROVIDER_SITE_OTHER): Payer: Medicare Other | Admitting: Physician Assistant

## 2017-09-17 ENCOUNTER — Encounter: Payer: Self-pay | Admitting: Physician Assistant

## 2017-09-17 ENCOUNTER — Other Ambulatory Visit: Payer: Self-pay

## 2017-09-17 VITALS — BP 140/82 | HR 64 | Temp 98.1°F | Resp 18 | Ht 70.0 in | Wt 168.6 lb

## 2017-09-17 DIAGNOSIS — B9789 Other viral agents as the cause of diseases classified elsewhere: Secondary | ICD-10-CM | POA: Diagnosis not present

## 2017-09-17 DIAGNOSIS — J069 Acute upper respiratory infection, unspecified: Secondary | ICD-10-CM

## 2017-09-17 MED ORDER — AZELASTINE HCL 0.1 % NA SOLN: 2.0000 | Freq: Two times a day (BID) | NASAL | 0 refills | Status: DC

## 2017-09-17 MED ORDER — BENZONATATE 100 MG PO CAPS: 100.0000 mg | ORAL_CAPSULE | Freq: Three times a day (TID) | ORAL | 0 refills | Status: DC | PRN

## 2017-09-17 MED ORDER — GUAIFENESIN ER 1200 MG PO TB12: 1.0000 | ORAL_TABLET | Freq: Two times a day (BID) | ORAL | 1 refills | Status: DC | PRN

## 2017-09-17 NOTE — Patient Instructions (Addendum)
Please ask your eye specialist to send me a report of your last visit there so that I can update your record.  Get plenty of rest and drink at least 64 ounces of water daily.   IF you received an x-ray today, you will receive an invoice from [facility]. Please contact [facility] at [phone] with questions or concerns regarding your invoice.   IF you received labwork today, you will receive an invoice from [facility]. Please contact [facility] at [phone] with questions or concerns regarding your invoice.   Our billing staff will not be able to assist you with questions regarding bills from these companies.  You will be contacted with the lab results as soon as they are available. The fastest way to get your results is to activate your My Chart account. Instructions are located on the last page of this paperwork. If you have not heard from [place] regarding the results in 2 weeks, please contact this office.

## 2017-09-17 NOTE — Progress Notes (Signed)
Patient ID: Bryan Knight, male    DOB: [birth year], 71 y.o.   MRN: [id number]  PCP: Harrison Mons, PA-C  Chief Complaint  Patient presents with  . Cough    x2 days, pt states cough is the worse symptom. Pt states he isn't having any pressure or fullness in the chest but feels fatigued. Pt states he has been taking meds OTC but doesn't remember the name but it was a "blue pill".  . Sore Throat    Pt states he thinks sore throat is from drainage. Pt states it doesn't hurt to swollow.    Subjective:   Presents for evaluation of cough and sore throat times 2 days.  No specific sick contacts. Lots of guests at his home over the holidays. Lots of noise and activity.  Symptoms began abruptly when he went to bed 2 nights ago. Feels a lot of drainage in the back of his throat. Cough is nonproductive.  No fevers or chills. No headache or dizziness. No vision changes. No ear pressure or pain. No body or joint aches. No nausea, vomiting or diarrhea.   Review of Systems As above.    Patient Active Problem List   Diagnosis Date Noted  . Mixed hyperlipidemia 04/25/2017  . Rhinophyma 03/27/2017  . Abdominal aortic ectasia ([place]) 07/05/2016  . Shortness of breath 04/21/2016  . CKD stage 3 due to type 2 diabetes mellitus ([place]) 04/05/2016  . Knee pain, left 10/19/2015  . Diastolic dysfunction 61/60/7371  . Microalbuminuria 04/02/2015  . Depression 04/02/2015  . B12 deficiency 01/11/2012  . Hypertension 10/20/2011  . Type 2 diabetes mellitus with complication, without long-term current use of insulin ([place]) 10/20/2011  . Hypothyroid 10/20/2011  . Rosacea 10/20/2011     Prior to Admission medications   Medication Sig Start Date End Date Taking? Authorizing Provider  Alcohol Swabs (ALCOHOL PREP) PADS Use daily for insulin injection 06/09/16  Yes Kimbree Casanas, PA-C  amLODipine (NORVASC) 10 MG tablet TAKE 1 TABLET (10 MG TOTAL) BY MOUTH DAILY. 08/20/17  Yes Maykel Reitter,  PA-C  aspirin 81 MG tablet Take 81 mg by mouth daily.    Yes [provider]  atorvastatin (LIPITOR) 20 MG tablet TAKE 1 TABLET (20 MG TOTAL) BY MOUTH DAILY. 08/20/17  Yes Alexine Pilant, PA-C  chlorthalidone (HYGROTON) 50 MG tablet Take 50 mg by mouth daily. 12/03/15  Yes [provider]  DULoxetine (CYMBALTA) 30 MG capsule Take 1 capsule (30 mg total) by mouth daily. 11/13/16  Yes Afiya Ferrebee, PA-C  hydrALAZINE (APRESOLINE) 100 MG tablet Take 100 mg by mouth 3 (three) times daily.  05/29/16  Yes [provider]  Insulin Glargine (BASAGLAR KWIKPEN) 100 UNIT/ML SOPN INJECT 0.1 MLS (10 UNITS TOTAL) INTO THE SKIN AT BEDTIME. 02/19/17  Yes Avia Merkley, PA-C  Insulin Glargine (BASAGLAR KWIKPEN) 100 UNIT/ML SOPN INJECT 0.1 MLS (10 UNITS TOTAL) INTO THE SKIN AT BEDTIME. 06/07/17  Yes Akansha Wyche, PA-C  Insulin Pen Needle (EASY TOUCH PEN NEEDLES) 31G X 8 MM MISC 1 each by Does not apply route daily. 04/25/17  Yes Raywood Wailes, PA-C  JANUVIA 100 MG tablet TAKE 1 TABLET DAILY 01/18/17  Yes Jari Dipasquale, PA-C  levothyroxine (SYNTHROID, LEVOTHROID) 75 MCG tablet TAKE 1 TABLET (75 MCG TOTAL) BY MOUTH DAILY. 07/19/17  Yes Irbin Fines, PA-C  lisinopril (PRINIVIL,ZESTRIL) 40 MG tablet Take 20 mg by mouth 2 (two) times daily.  04/17/16  Yes [provider]  metFORMIN (GLUCOPHAGE) 500 MG tablet TAKE  1 TABLET (500 MG TOTAL) BY MOUTH 2 (TWO) TIMES DAILY WITH A MEAL. 08/20/17  Yes Tina Gruner, PA-C  metoprolol succinate (TOPROL-XL) 100 MG 24 hr tablet TAKE 1 TABLET (100 MG TOTAL) BY MOUTH DAILY. TAKE WITH OR IMMEDIATELY FOLLOWING A MEAL. 09/03/17  Yes Sandar Krinke, PA-C  montelukast (SINGULAIR) 10 MG tablet TAKE ONE TABLET BY MOUTH NIGHTLY AT BEDTIME 03/20/17  Yes Christie Viscomi, PA-C  ONETOUCH DELICA LANCETS 54Y MISC USE TO TEST ONCE DAILY AS DIRECTED**MAX DAYS SUPPLY 90** 08/03/16  Yes Everleigh Colclasure, PA-C  ONETOUCH DELICA LANCETS 70W MISC Test blood sugar once  daily. Dx E11.8 09/03/17  Yes Jesse Nosbisch, PA-C  ONETOUCH VERIO test strip TEST BLOOD SUGAR ONCE DAILY. 08/21/17  Yes Harrison Mons, PA-C     Allergies  Allergen Reactions  . Citalopram     Ineffective  . Tussionex Pennkinetic Er [Hydrocod Polst-Cpm Polst Er] Other (See Comments)    Foggy headed  . Wellbutrin [Bupropion Hcl] Other (See Comments)    Sluggish        Objective:  Physical Exam  Constitutional: He is oriented to person, place, and time. He appears well-developed and well-nourished. No distress.  BP 140/82 (BP Location: Left Arm, Patient Position: Sitting, Cuff Size: Normal)   Pulse 64   Temp 98.1 F (36.7 C) (Oral)   Resp 18   Ht 5\' 10"  (1.778 m)   Wt 168 lb 9.6 oz (76.5 kg)   SpO2 99%   BMI 24.19 kg/m    HENT:  Head: Normocephalic and atraumatic.  Right Ear: Hearing, tympanic membrane, external ear and ear canal normal.  Left Ear: Hearing, tympanic membrane, external ear and ear canal normal.  Nose: Mucosal edema and rhinorrhea present.  No foreign bodies. Right sinus exhibits no maxillary sinus tenderness and no frontal sinus tenderness. Left sinus exhibits no maxillary sinus tenderness and no frontal sinus tenderness.  Mouth/Throat: Uvula is midline, oropharynx is clear and moist and mucous membranes are normal. No uvula swelling. No oropharyngeal exudate.  Eyes: Conjunctivae and EOM are normal. Pupils are equal, round, and reactive to light. Right eye exhibits no discharge. Left eye exhibits no discharge. No scleral icterus.  Neck: Trachea normal, normal range of motion and full passive range of motion without pain. Neck supple. No thyroid mass and no thyromegaly present.  Cardiovascular: Normal rate, regular rhythm and normal heart sounds.  Pulmonary/Chest: Effort normal and breath sounds normal.  Lymphadenopathy:       Head (right side): No submandibular, no tonsillar, no preauricular, no posterior auricular and no occipital adenopathy present.        Head (left side): No submandibular, no tonsillar, no preauricular and no occipital adenopathy present.    He has no cervical adenopathy.       Right: No supraclavicular adenopathy present.       Left: No supraclavicular adenopathy present.  Neurological: He is alert and oriented to person, place, and time. He has normal strength. No cranial nerve deficit or sensory deficit.  Skin: Skin is warm, dry and intact. No rash noted.  Psychiatric: He has a normal mood and affect. His speech is normal and behavior is normal.           Assessment & Plan:   1. Viral URI with cough Supportive care.  Anticipatory guidance.  RTC if symptoms worsen/persist. - azelastine (ASTELIN) 0.1 % nasal spray; Place 2 sprays into both nostrils 2 (two) times daily. Use in each nostril as directed  Dispense: 30 mL;  Refill: 0 - benzonatate (TESSALON) 100 MG capsule; Take 1-2 capsules (100-200 mg total) by mouth 3 (three) times daily as needed for cough.  Dispense: 40 capsule; Refill: 0 - Guaifenesin (MUCINEX MAXIMUM STRENGTH) 1200 MG TB12; Take 1 tablet (1,200 mg total) by mouth every 12 (twelve) hours as needed.  Dispense: 14 tablet; Refill: 1    Return for previously scheduled appointment 10/02/2017, sooner if symptoms worsen.   Fara Chute, PA-C [facility]

## 2017-10-02 ENCOUNTER — Encounter: Payer: Self-pay | Admitting: Physician Assistant

## 2017-10-02 ENCOUNTER — Ambulatory Visit (INDEPENDENT_AMBULATORY_CARE_PROVIDER_SITE_OTHER): Payer: Medicare Other | Admitting: Physician Assistant

## 2017-10-02 ENCOUNTER — Other Ambulatory Visit: Payer: Self-pay

## 2017-10-02 VITALS — BP 110/60 | HR 64 | Temp 98.2°F | Resp 18 | Ht 70.0 in | Wt 167.8 lb

## 2017-10-02 DIAGNOSIS — E782 Mixed hyperlipidemia: Secondary | ICD-10-CM

## 2017-10-02 DIAGNOSIS — N183 Chronic kidney disease, stage 3 (moderate): Secondary | ICD-10-CM | POA: Diagnosis not present

## 2017-10-02 DIAGNOSIS — E1122 Type 2 diabetes mellitus with diabetic chronic kidney disease: Secondary | ICD-10-CM

## 2017-10-02 DIAGNOSIS — E039 Hypothyroidism, unspecified: Secondary | ICD-10-CM

## 2017-10-02 DIAGNOSIS — R809 Proteinuria, unspecified: Secondary | ICD-10-CM

## 2017-10-02 DIAGNOSIS — I10 Essential (primary) hypertension: Secondary | ICD-10-CM

## 2017-10-02 DIAGNOSIS — E118 Type 2 diabetes mellitus with unspecified complications: Secondary | ICD-10-CM

## 2017-10-02 NOTE — Progress Notes (Signed)
Subjective:    Patient ID: Bryan Knight, male    DOB: 1946-05-04, 72 y.o.   MRN: [id number]  Chief Complaint  Patient presents with  . Diabetes  . Hypertension  . Hyperlipidemia  . Hypothyroidism  . Follow-up   Home glucose levels range from 105-167.  Home BP log ranges from systolic BP 628-315. He is tolerating his medications well. He denies any nausea, vomiting, diarrhea, headache, dizziness, changes in bowel habits/urination, muscle pain, or swelling.  At his last visit on 06/29/17, Cr levels 2.24 and GFR 28. Due to these changes, patient instructed to follow-up with his nephrologist. Patient has not seen the nephrologist since his last visit at our office, but has an appointment in two days.   Patient otherwise feels good.   He does not watch his diet and eats candy most every day and the occasional cupcake. No exercise in the winter because "it's too cold."    Review of Systems  As above.  Patient Active Problem List   Diagnosis Date Noted  . Mixed hyperlipidemia 04/25/2017  . Rhinophyma 03/27/2017  . Abdominal aortic ectasia ([place]) 07/05/2016  . Shortness of breath 04/21/2016  . CKD stage 3 due to type 2 diabetes mellitus ([place]) 04/05/2016  . Knee pain, left 10/19/2015  . Diastolic dysfunction 17/61/6073  . Microalbuminuria 04/02/2015  . Depression 04/02/2015  . B12 deficiency 01/11/2012  . Hypertension 10/20/2011  . Type 2 diabetes mellitus with complication, without long-term current use of insulin ([place]) 10/20/2011  . Hypothyroid 10/20/2011  . Rosacea 10/20/2011   Prior to Admission medications   Medication Sig Start Date End Date Taking? Authorizing Provider  Alcohol Swabs (ALCOHOL PREP) PADS Use daily for insulin injection 06/09/16  Yes Jeffery, Chelle, PA-C  amLODipine (NORVASC) 10 MG tablet TAKE 1 TABLET (10 MG TOTAL) BY MOUTH DAILY. 08/20/17  Yes Jeffery, Chelle, PA-C  aspirin 81 MG tablet Take 81 mg by mouth daily.    Yes [provider]    atorvastatin (LIPITOR) 20 MG tablet TAKE 1 TABLET (20 MG TOTAL) BY MOUTH DAILY. 08/20/17  Yes Jeffery, Chelle, PA-C  chlorthalidone (HYGROTON) 50 MG tablet Take 50 mg by mouth daily. 12/03/15  Yes [provider]  DULoxetine (CYMBALTA) 30 MG capsule Take 1 capsule (30 mg total) by mouth daily. 11/13/16  Yes Jeffery, Chelle, PA-C  hydrALAZINE (APRESOLINE) 100 MG tablet Take 100 mg by mouth 3 (three) times daily.  05/29/16  Yes [provider]  Insulin Glargine (BASAGLAR KWIKPEN) 100 UNIT/ML SOPN INJECT 0.1 MLS (10 UNITS TOTAL) INTO THE SKIN AT BEDTIME. 02/19/17  Yes Jeffery, Chelle, PA-C  Insulin Glargine (BASAGLAR KWIKPEN) 100 UNIT/ML SOPN INJECT 0.1 MLS (10 UNITS TOTAL) INTO THE SKIN AT BEDTIME. 06/07/17  Yes Jeffery, Chelle, PA-C  Insulin Pen Needle (EASY TOUCH PEN NEEDLES) 31G X 8 MM MISC 1 each by Does not apply route daily. 04/25/17  Yes Jeffery, Chelle, PA-C  JANUVIA 100 MG tablet TAKE 1 TABLET DAILY 01/18/17  Yes Jeffery, Chelle, PA-C  levothyroxine (SYNTHROID, LEVOTHROID) 75 MCG tablet TAKE 1 TABLET (75 MCG TOTAL) BY MOUTH DAILY. 07/19/17  Yes Jeffery, Chelle, PA-C  lisinopril (PRINIVIL,ZESTRIL) 40 MG tablet Take 20 mg by mouth 2 (two) times daily.  04/17/16  Yes [provider]  metFORMIN (GLUCOPHAGE) 500 MG tablet TAKE 1 TABLET (500 MG TOTAL) BY MOUTH 2 (TWO) TIMES DAILY WITH A MEAL. 08/20/17  Yes Jeffery, Chelle, PA-C  metoprolol succinate (TOPROL-XL) 100 MG 24 hr tablet TAKE 1 TABLET (100 MG  TOTAL) BY MOUTH DAILY. TAKE WITH OR IMMEDIATELY FOLLOWING A MEAL. 09/03/17  Yes Jeffery, Chelle, PA-C  montelukast (SINGULAIR) 10 MG tablet TAKE ONE TABLET BY MOUTH NIGHTLY AT BEDTIME 03/20/17  Yes Jeffery, Chelle, PA-C  ONETOUCH DELICA LANCETS 78I MISC USE TO TEST ONCE DAILY AS DIRECTED**MAX DAYS SUPPLY 90** 08/03/16  Yes Jeffery, Chelle, PA-C  ONETOUCH DELICA LANCETS 69G MISC Test blood sugar once daily. Dx E11.8 09/03/17  Yes Jeffery, Chelle, PA-C  ONETOUCH VERIO test strip TEST BLOOD  SUGAR ONCE DAILY. 08/21/17  Yes Jeffery, Chelle, PA-C  benzonatate (TESSALON) 100 MG capsule Take 1-2 capsules (100-200 mg total) by mouth 3 (three) times daily as needed for cough. Patient not taking: Reported on 10/02/2017 09/17/17   Harrison Mons, PA-C   Allergies  Allergen Reactions  . Citalopram     Ineffective  . Tussionex Pennkinetic Er [Hydrocod Polst-Cpm Polst Er] Other (See Comments)    Foggy headed  . Wellbutrin [Bupropion Hcl] Other (See Comments)    Sluggish        Objective:   Physical Exam  Constitutional: He is oriented to person, place, and time. He appears well-developed and well-nourished.  Neck: No JVD present.  Cardiovascular: Normal rate, regular rhythm, normal heart sounds and intact distal pulses.  Pulses:      Radial pulses are 2+ on the right side, and 2+ on the left side.       Posterior tibial pulses are 2+ on the right side, and 2+ on the left side.  Pulmonary/Chest: Effort normal and breath sounds normal.  Musculoskeletal: He exhibits no edema.  Neurological: He is alert and oriented to person, place, and time.  Psychiatric: He has a normal mood and affect. His behavior is normal.   Vitals:   10/02/17 1003  BP: 110/60  Pulse: 64  Resp: 18  Temp: 98.2 F (36.8 C)  SpO2: 98%      Assessment & Plan:  1. Type 2 diabetes mellitus with complication, without long-term current use of insulin (HCC) Continue current medications. Await lab results and will adjust as needed.   - Comprehensive metabolic panel - Hemoglobin A1c - Lipid panel  2. Essential hypertension Well controlled. Continue current medications.   - CBC with Differential/Platelet - Comprehensive metabolic panel  3. CKD stage 3 due to type 2 diabetes mellitus ([place]) 4. Microalbuminuria Continue efforts to control HTN and diabetes. Has appointment with nephrologist in 2 days.   - CBC with Differential/Platelet - Comprehensive metabolic panel  5. Hypothyroidism, unspecified  type Continue treatment. Most recent labs were within normal limits.  - TSH  6. Mixed hyperlipidemia Continue Atorvastatin. Goal LDL <70.   Return in about 3 months (around 12/31/2017) for re-evalaution of diabetes, cholesterol, etc.  Noemi Chapel, PA-S

## 2017-10-02 NOTE — Patient Instructions (Addendum)
     IF you received an x-ray today, you will receive an invoice from [facility]. Please contact [facility] at [phone] with questions or concerns regarding your invoice.   IF you received labwork today, you will receive an invoice from [facility]. Please contact [facility] at [phone] with questions or concerns regarding your invoice.   Our billing staff will not be able to assist you with questions regarding bills from these companies.  You will be contacted with the lab results as soon as they are available. The fastest way to get your results is to activate your My Chart account. Instructions are located on the last page of this paperwork. If you have not heard from us regarding the results in 2 weeks, please contact this office.     

## 2017-10-02 NOTE — Progress Notes (Signed)
Patient ID: Bryan Knight, male    DOB: [birth year], 72 y.o.   MRN: [id number]  PCP: Harrison Mons, PA-C  Chief Complaint  Patient presents with  . Diabetes  . Hypertension  . Hyperlipidemia  . Hypothyroidism  . Follow-up    Subjective:   Presents for evaluation of diabetes, HTN, hyperlipidemia, hypothyroidism.  Has not seen nephrology since his last visit with me. I noted at his last visit that his renal function declined and asked if we needed to stop the metformin. Visit is scheduled 2 days from today.  Home glucose 105-167. Home SBP 125-160. Tolerates his medications well, without adverse effects. Eats whatever he likes, including sweets. Not exercising due to the colder temperatures.   Review of Systems Denies chest pain, shortness of breath, HA, dizziness, vision change, nausea, vomiting, diarrhea, constipation, melena, hematochezia, dysuria, increased urinary urgency or frequency, increased hunger or thirst, unintentional weight change, unexplained myalgias or arthralgias, rash.   Depression screen [facility] 2/9 10/02/2017 09/17/2017 06/29/2017 06/21/2017 03/27/2017  Decreased Interest 0 0 0 0 0  Down, Depressed, Hopeless 0 0 0 0 0  PHQ - 2 Score 0 0 0 0 0  Altered sleeping - - - 0 -  Tired, decreased energy - - - 0 -  Change in appetite - - - 0 -  Feeling bad or failure about yourself  - - - 0 -  Trouble concentrating - - - 0 -  Moving slowly or fidgety/restless - - - 0 -  Suicidal thoughts - - - 0 -  PHQ-9 Score - - - 0 -  Difficult doing work/chores - - - Not difficult at all -     Patient Active Problem List   Diagnosis Date Noted  . Mixed hyperlipidemia 04/25/2017  . Rhinophyma 03/27/2017  . Abdominal aortic ectasia ([place]) 07/05/2016  . Shortness of breath 04/21/2016  . CKD stage 3 due to type 2 diabetes mellitus ([place]) 04/05/2016  . Knee pain, left 10/19/2015  . Diastolic dysfunction 40/98/1191  . Microalbuminuria 04/02/2015  . Depression 04/02/2015    . B12 deficiency 01/11/2012  . Hypertension 10/20/2011  . Type 2 diabetes mellitus with complication, without long-term current use of insulin ([place]) 10/20/2011  . Hypothyroid 10/20/2011  . Rosacea 10/20/2011     Prior to Admission medications   Medication Sig Start Date End Date Taking? Authorizing Provider  Alcohol Swabs (ALCOHOL PREP) PADS Use daily for insulin injection 06/09/16  Yes Kanya Potteiger, PA-C  amLODipine (NORVASC) 10 MG tablet TAKE 1 TABLET (10 MG TOTAL) BY MOUTH DAILY. 08/20/17  Yes Annikah Lovins, PA-C  aspirin 81 MG tablet Take 81 mg by mouth daily.    Yes [provider]  atorvastatin (LIPITOR) 20 MG tablet TAKE 1 TABLET (20 MG TOTAL) BY MOUTH DAILY. 08/20/17  Yes Elva Breaker, PA-C  chlorthalidone (HYGROTON) 50 MG tablet Take 50 mg by mouth daily. 12/03/15  Yes [provider]  DULoxetine (CYMBALTA) 30 MG capsule Take 1 capsule (30 mg total) by mouth daily. 11/13/16  Yes Xavi Tomasik, PA-C  hydrALAZINE (APRESOLINE) 100 MG tablet Take 100 mg by mouth 3 (three) times daily.  05/29/16  Yes [provider]  Insulin Glargine (BASAGLAR KWIKPEN) 100 UNIT/ML SOPN INJECT 0.1 MLS (10 UNITS TOTAL) INTO THE SKIN AT BEDTIME. 02/19/17  Yes Truman Aceituno, PA-C  Insulin Glargine (BASAGLAR KWIKPEN) 100 UNIT/ML SOPN INJECT 0.1 MLS (10 UNITS TOTAL) INTO THE SKIN AT BEDTIME. 06/07/17  Yes Shakura Cowing, PA-C  Insulin Pen  Needle (EASY TOUCH PEN NEEDLES) 31G X 8 MM MISC 1 each by Does not apply route daily. 04/25/17  Yes Suleman Gunning, PA-C  JANUVIA 100 MG tablet TAKE 1 TABLET DAILY 01/18/17  Yes Meliza Kage, PA-C  levothyroxine (SYNTHROID, LEVOTHROID) 75 MCG tablet TAKE 1 TABLET (75 MCG TOTAL) BY MOUTH DAILY. 07/19/17  Yes Eren Ryser, PA-C  lisinopril (PRINIVIL,ZESTRIL) 40 MG tablet Take 20 mg by mouth 2 (two) times daily.  04/17/16  Yes [provider]  metFORMIN (GLUCOPHAGE) 500 MG tablet TAKE 1 TABLET (500 MG TOTAL) BY MOUTH 2 (TWO) TIMES DAILY  WITH A MEAL. 08/20/17  Yes Addasyn Mcbreen, PA-C  metoprolol succinate (TOPROL-XL) 100 MG 24 hr tablet TAKE 1 TABLET (100 MG TOTAL) BY MOUTH DAILY. TAKE WITH OR IMMEDIATELY FOLLOWING A MEAL. 09/03/17  Yes Koury Roddy, PA-C  montelukast (SINGULAIR) 10 MG tablet TAKE ONE TABLET BY MOUTH NIGHTLY AT BEDTIME 03/20/17  Yes Kanon Novosel, PA-C  ONETOUCH DELICA LANCETS 38H MISC USE TO TEST ONCE DAILY AS DIRECTED**MAX DAYS SUPPLY 90** 08/03/16  Yes Javaun Dimperio, PA-C  ONETOUCH DELICA LANCETS 82X MISC Test blood sugar once daily. Dx E11.8 09/03/17  Yes Atilla Zollner, PA-C  ONETOUCH VERIO test strip TEST BLOOD SUGAR ONCE DAILY. 08/21/17  Yes Harrison Mons, PA-C     Allergies  Allergen Reactions  . Citalopram     Ineffective  . Tussionex Pennkinetic Er [Hydrocod Polst-Cpm Polst Er] Other (See Comments)    Foggy headed  . Wellbutrin [Bupropion Hcl] Other (See Comments)    Sluggish        Objective:  Physical Exam  Constitutional: He is oriented to person, place, and time. He appears well-developed and well-nourished. He is active and cooperative. No distress.  BP 110/60 (BP Location: Left Arm, Patient Position: Sitting, Cuff Size: Normal)   Pulse 64   Temp 98.2 F (36.8 C) (Oral)   Resp 18   Ht 5\' 10"  (1.778 m)   Wt 167 lb 12.8 oz (76.1 kg)   SpO2 98%   BMI 24.08 kg/m   HENT:  Head: Normocephalic and atraumatic.  Right Ear: Hearing normal.  Left Ear: Hearing normal.  Eyes: Conjunctivae are normal. No scleral icterus.  Neck: Normal range of motion. Neck supple. No thyromegaly present.  Cardiovascular: Normal rate, regular rhythm and normal heart sounds.  Pulses:      Radial pulses are 2+ on the right side, and 2+ on the left side.  Pulmonary/Chest: Effort normal and breath sounds normal.  Lymphadenopathy:       Head (right side): No tonsillar, no preauricular, no posterior auricular and no occipital adenopathy present.       Head (left side): No tonsillar, no preauricular, no  posterior auricular and no occipital adenopathy present.    He has no cervical adenopathy.       Right: No supraclavicular adenopathy present.       Left: No supraclavicular adenopathy present.  Neurological: He is alert and oriented to person, place, and time. No sensory deficit.  Skin: Skin is warm, dry and intact. No rash noted. No cyanosis or erythema. Nails show no clubbing.  Psychiatric: He has a normal mood and affect. His speech is normal and behavior is normal.           Assessment & Plan:   Problem List Items Addressed This Visit    Hypertension    COntrolled. COntinue current treatment. Follow-up with nephrology as planned in 2 days.      Relevant Orders  CBC with Differential/Platelet (Completed)   Comprehensive metabolic panel (Completed)   Type 2 diabetes mellitus with complication, without long-term current use of insulin (HCC) - Primary    Appears controlled, based on home readings, despite lack of exercise and diet modification. Await A1C.      Relevant Orders   Comprehensive metabolic panel (Completed)   Hemoglobin A1c (Completed)   Lipid panel (Completed)   Hypothyroid    Await lab results. Adjust levothyroxine dose as indicated.      Relevant Orders   TSH (Completed)   Microalbuminuria   CKD stage 3 due to type 2 diabetes mellitus (HCC)    Creatinine was up last check. Follow-up per nephrology. Next visit in 2 days.      Relevant Orders   CBC with Differential/Platelet (Completed)   Comprehensive metabolic panel (Completed)   Mixed hyperlipidemia    Await lab results. LDL has been <70.          Return in about 3 months (around 12/31/2017) for re-evalaution of diabetes, cholesterol, etc.   Fara Chute, PA-C [facility] [place]

## 2017-10-03 LAB — COMPREHENSIVE METABOLIC PANEL
A/G RATIO: 2 (ref 1.2–2.2)
ALK PHOS: 72 IU/L (ref 39–117)
ALT: 17 IU/L (ref 0–44)
AST: 16 IU/L (ref 0–40)
Albumin: 4.5 g/dL (ref 3.5–4.8)
BILIRUBIN TOTAL: 0.3 mg/dL (ref 0.0–1.2)
BUN / CREAT RATIO: 15 (ref 10–24)
BUN: 31 mg/dL — ABNORMAL HIGH (ref 8–27)
CHLORIDE: 101 mmol/L (ref 96–106)
CO2: 25 mmol/L (ref 20–29)
Calcium: 9.7 mg/dL (ref 8.6–10.2)
Creatinine, Ser: 2.06 mg/dL — ABNORMAL HIGH (ref 0.76–1.27)
GFR calc non Af Amer: 31 mL/min/{1.73_m2} — ABNORMAL LOW (ref >59)
GFR, EST AFRICAN AMERICAN: 36 mL/min/{1.73_m2} — AB (ref >59)
Globulin, Total: 2.3 g/dL (ref 1.5–4.5)
Glucose: 142 mg/dL — ABNORMAL HIGH (ref 65–99)
POTASSIUM: 3.6 mmol/L (ref 3.5–5.2)
Sodium: 143 mmol/L (ref 134–144)
TOTAL PROTEIN: 6.8 g/dL (ref 6.0–8.5)

## 2017-10-03 LAB — CBC WITH DIFFERENTIAL/PLATELET
BASOS: 0 %
Basophils Absolute: 0 10*3/uL (ref 0.0–0.2)
EOS (ABSOLUTE): 0.5 10*3/uL — AB (ref 0.0–0.4)
Eos: 4 %
Hematocrit: 41.9 % (ref 37.5–51.0)
Hemoglobin: 14.4 g/dL (ref 13.0–17.7)
IMMATURE GRANS (ABS): 0 10*3/uL (ref 0.0–0.1)
Immature Granulocytes: 0 %
LYMPHS ABS: 1.5 10*3/uL (ref 0.7–3.1)
Lymphs: 13 %
MCH: 29.4 pg (ref 26.6–33.0)
MCHC: 34.4 g/dL (ref 31.5–35.7)
MCV: 86 fL (ref 79–97)
Monocytes Absolute: 1.1 10*3/uL — ABNORMAL HIGH (ref 0.1–0.9)
Monocytes: 10 %
NEUTROS ABS: 8.4 10*3/uL — AB (ref 1.4–7.0)
Neutrophils: 73 %
PLATELETS: 329 10*3/uL (ref 150–379)
RBC: 4.89 x10E6/uL (ref 4.14–5.80)
RDW: 13.7 % (ref 12.3–15.4)
WBC: 11.6 10*3/uL — ABNORMAL HIGH (ref 3.4–10.8)

## 2017-10-03 LAB — TSH: TSH: 2.83 u[IU]/mL (ref 0.450–4.500)

## 2017-10-03 LAB — HEMOGLOBIN A1C
Est. average glucose Bld gHb Est-mCnc: 143 mg/dL
Hgb A1c MFr Bld: 6.6 % — ABNORMAL HIGH (ref 4.8–5.6)

## 2017-10-03 LAB — LIPID PANEL
CHOL/HDL RATIO: 2.9 ratio (ref 0.0–5.0)
CHOLESTEROL TOTAL: 108 mg/dL (ref 100–199)
HDL: 37 mg/dL — ABNORMAL LOW (ref >39)
LDL CALC: 47 mg/dL (ref 0–99)
Triglycerides: 119 mg/dL (ref 0–149)
VLDL Cholesterol Cal: 24 mg/dL (ref 5–40)

## 2017-10-04 DIAGNOSIS — R809 Proteinuria, unspecified: Secondary | ICD-10-CM | POA: Diagnosis not present

## 2017-10-04 DIAGNOSIS — I129 Hypertensive chronic kidney disease with stage 1 through stage 4 chronic kidney disease, or unspecified chronic kidney disease: Secondary | ICD-10-CM | POA: Diagnosis not present

## 2017-10-04 DIAGNOSIS — E1122 Type 2 diabetes mellitus with diabetic chronic kidney disease: Secondary | ICD-10-CM | POA: Diagnosis not present

## 2017-10-04 DIAGNOSIS — N183 Chronic kidney disease, stage 3 (moderate): Secondary | ICD-10-CM | POA: Diagnosis not present

## 2017-10-14 NOTE — Assessment & Plan Note (Addendum)
Await lab results. LDL has been <70.

## 2017-10-14 NOTE — Assessment & Plan Note (Signed)
Appears controlled, based on home readings, despite lack of exercise and diet modification. Await A1C.

## 2017-10-14 NOTE — Assessment & Plan Note (Signed)
COntrolled. COntinue current treatment. Follow-up with nephrology as planned in 2 days.

## 2017-10-14 NOTE — Assessment & Plan Note (Signed)
Await lab results. Adjust levothyroxine dose as indicated.

## 2017-10-14 NOTE — Assessment & Plan Note (Addendum)
Creatinine was up last check. Follow-up per nephrology. Next visit in 2 days.

## 2017-10-15 ENCOUNTER — Other Ambulatory Visit: Payer: Self-pay | Admitting: Physician Assistant

## 2017-10-15 DIAGNOSIS — B9789 Other viral agents as the cause of diseases classified elsewhere: Principal | ICD-10-CM

## 2017-10-15 DIAGNOSIS — J069 Acute upper respiratory infection, unspecified: Secondary | ICD-10-CM

## 2017-10-16 ENCOUNTER — Other Ambulatory Visit: Payer: Self-pay | Admitting: Physician Assistant

## 2017-10-16 DIAGNOSIS — E039 Hypothyroidism, unspecified: Secondary | ICD-10-CM

## 2017-11-02 IMAGING — US US RENAL
1 series · 14 of 25 positions shown · non-contrast
Comparison: 12/27/2010

CLINICAL DATA: Stage III chronic renal disease.

EXAM:
RENAL / URINARY TRACT ULTRASOUND COMPLETE

[Series 1: us renal · 0.30mm/px · 14 of 49 slices shown]
[im 1/49]
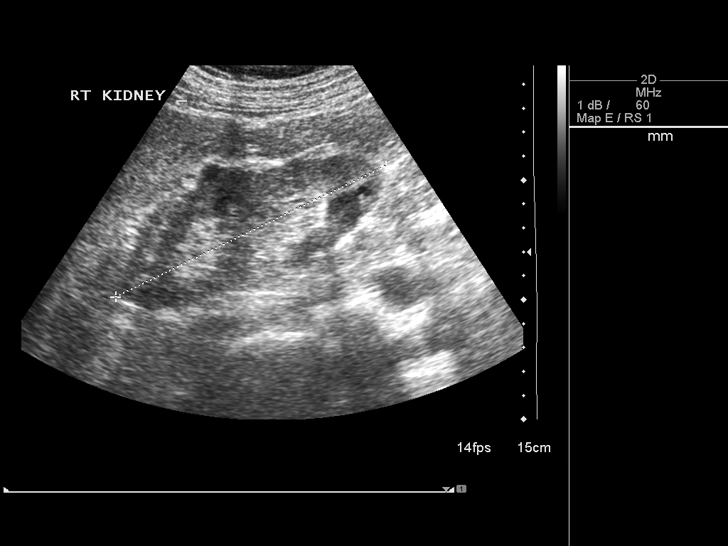
[im 5/49]
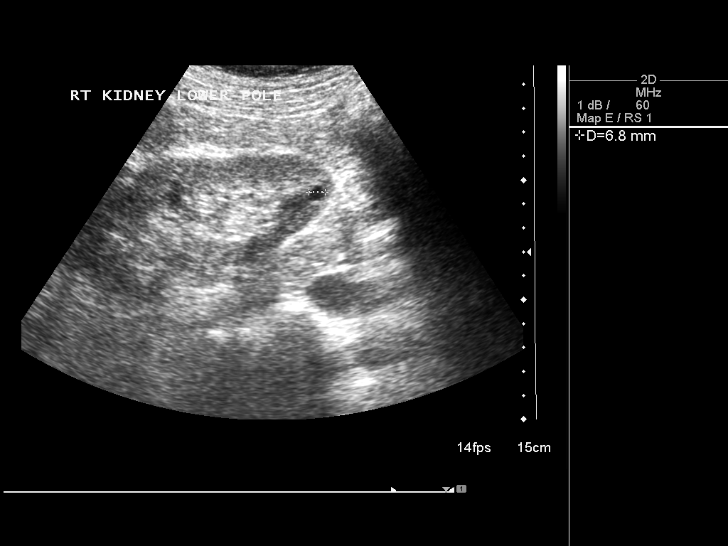
[im 9/49]
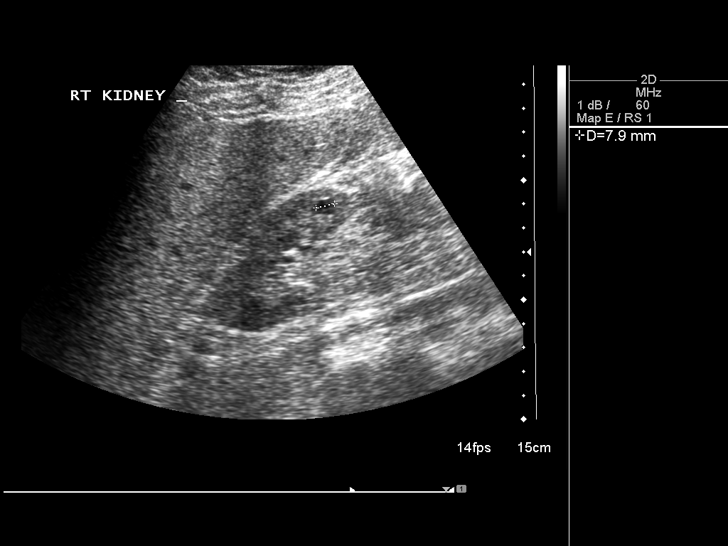
[im 13/49]
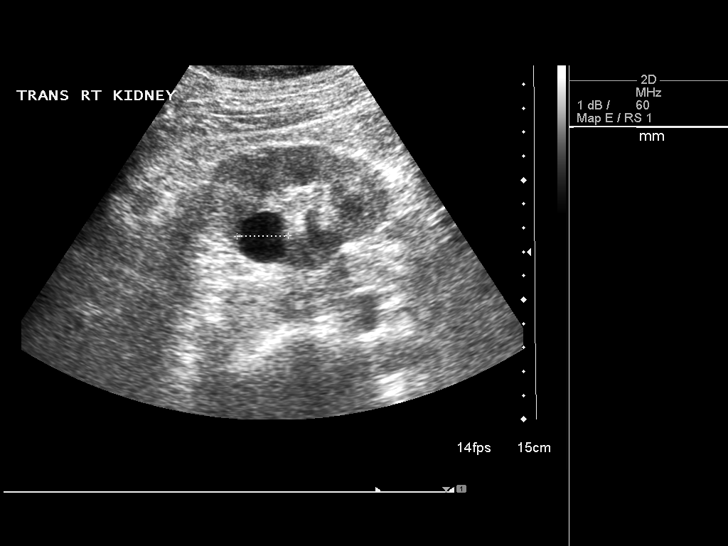
[im 17/49]
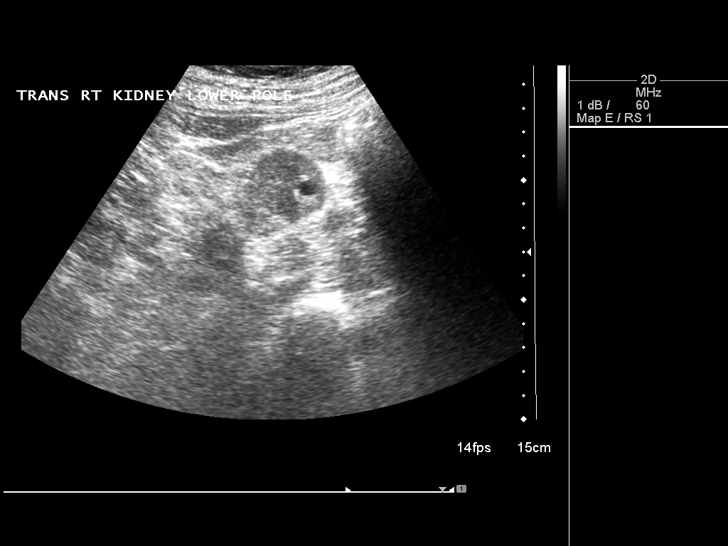
[im 19/49]
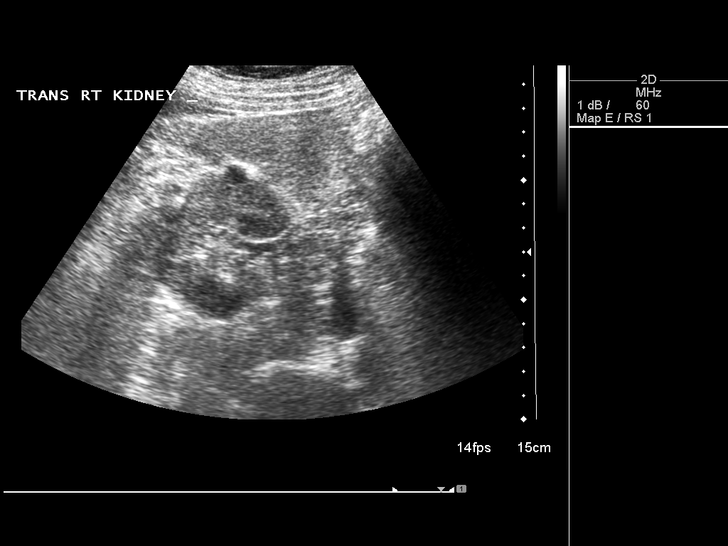
[im 23/49]
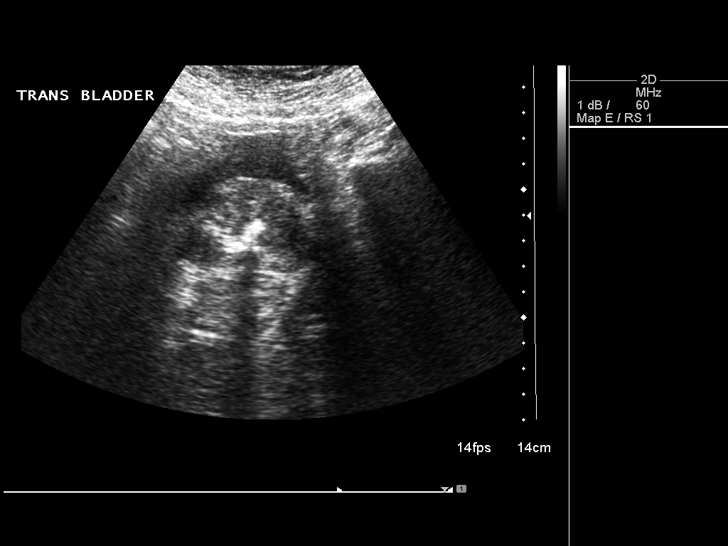
[im 27/49]
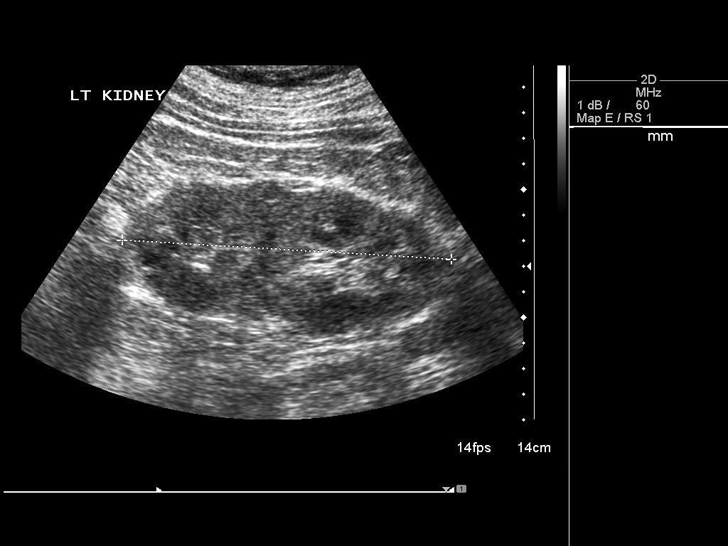
[im 31/49]
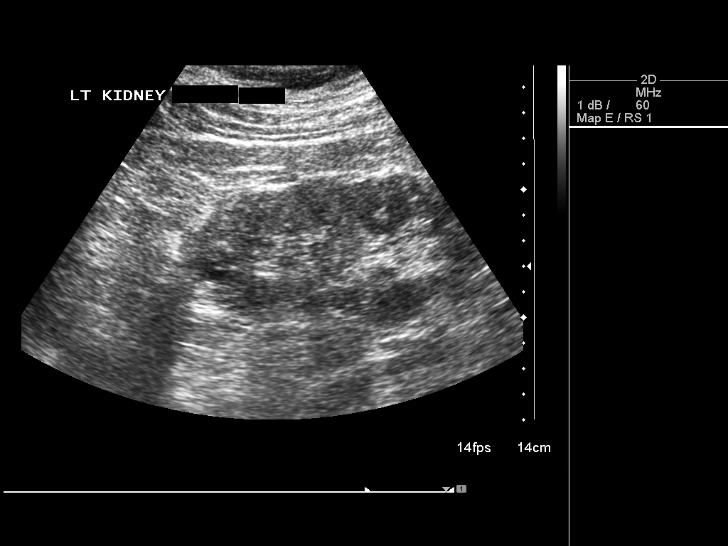
[im 33/49]
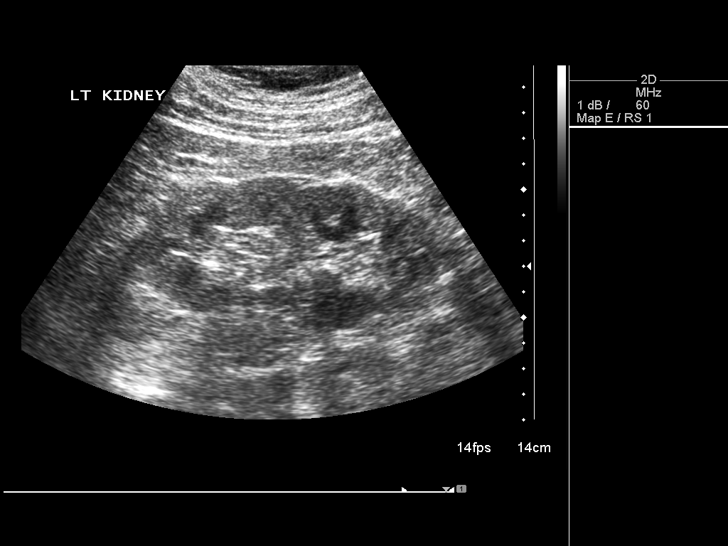
[im 37/49]
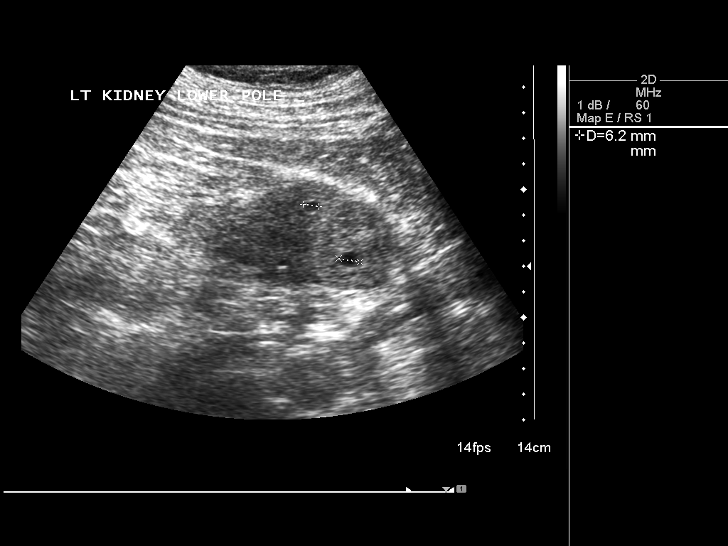
[im 41/49]
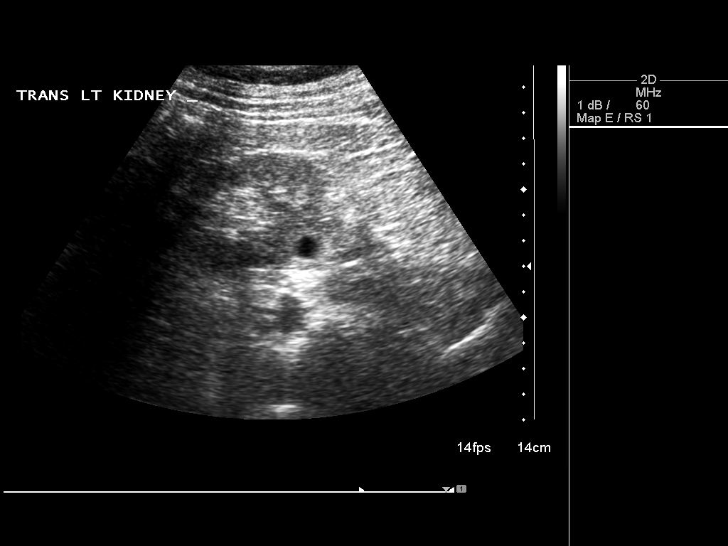
[im 45/49]
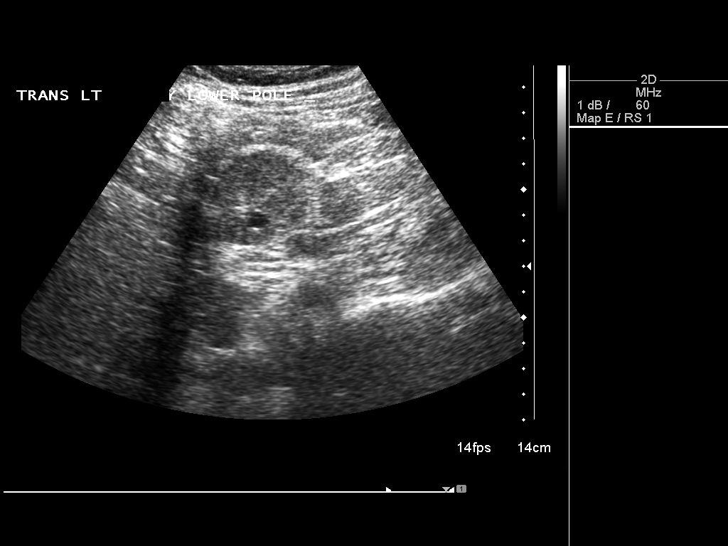
[im 49/49]
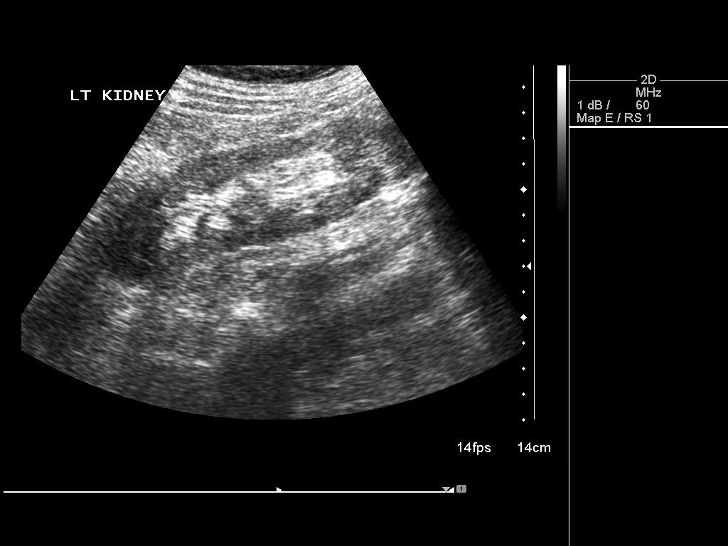

[14 of 25 positions shown; findings below may reference images not displayed]

FINDINGS: Right Kidney:

Length: 12.5 cm. Echogenicity within normal limits. No mass or
hydronephrosis visualized. Cortical thinning is increased from 8318.
Multiple simple appearing cysts, the largest in the mid kidney
measuring 2.6 by 2.2 by 2.1 cm.

Left Kidney:

Length: 12.9 cm. Echogenicity within normal limits. No mass or
hydronephrosis visualized. Multiple simple appearing renal cysts,
the largest in the mid kidney measuring 1.4 cm in diameter. As on
the contralateral side, cortical thinning is increased from 8318.

Bladder:

Enlarged prostate gland indents the bladder base. The bladder is
nondistended.
IMPRESSION: 1. Compared to 8318, there is increase cortical thinning but with
normal cortical echogenicity.
2. Scattered bilateral renal cysts appear simple.

## 2017-11-08 ENCOUNTER — Other Ambulatory Visit: Payer: Self-pay | Admitting: Physician Assistant

## 2017-11-08 NOTE — Telephone Encounter (Signed)
duloxetine refill Last OV: 10/02/17 Last Refill:08/04/17 Pharmacy: [facility]

## 2017-11-09 ENCOUNTER — Encounter: Payer: Self-pay | Admitting: Physician Assistant

## 2017-11-10 MED ORDER — DULOXETINE HCL 30 MG PO CPEP: 30.0000 mg | ORAL_CAPSULE | Freq: Every day | ORAL | 3 refills | Status: DC

## 2017-11-25 ENCOUNTER — Other Ambulatory Visit: Payer: Self-pay | Admitting: Physician Assistant

## 2017-11-25 DIAGNOSIS — IMO0001 Reserved for inherently not codable concepts without codable children: Secondary | ICD-10-CM

## 2017-11-25 DIAGNOSIS — E785 Hyperlipidemia, unspecified: Secondary | ICD-10-CM

## 2017-11-25 DIAGNOSIS — E1165 Type 2 diabetes mellitus with hyperglycemia: Principal | ICD-10-CM

## 2017-11-26 NOTE — Telephone Encounter (Signed)
Metformin refill Last OV: 10/02/17 Last Refill:08/20/17  Pharmacy:[facility] [address] PCP: Bryan Knight

## 2017-11-29 ENCOUNTER — Other Ambulatory Visit: Payer: Self-pay | Admitting: Physician Assistant

## 2017-11-29 DIAGNOSIS — I10 Essential (primary) hypertension: Secondary | ICD-10-CM

## 2017-11-29 DIAGNOSIS — I517 Cardiomegaly: Secondary | ICD-10-CM

## 2017-12-07 ENCOUNTER — Encounter: Payer: Self-pay | Admitting: Podiatry

## 2017-12-07 ENCOUNTER — Ambulatory Visit (INDEPENDENT_AMBULATORY_CARE_PROVIDER_SITE_OTHER): Payer: Medicare Other | Admitting: Podiatry

## 2017-12-07 DIAGNOSIS — M79676 Pain in unspecified toe(s): Secondary | ICD-10-CM | POA: Diagnosis not present

## 2017-12-07 DIAGNOSIS — B351 Tinea unguium: Secondary | ICD-10-CM

## 2017-12-07 DIAGNOSIS — E119 Type 2 diabetes mellitus without complications: Secondary | ICD-10-CM

## 2017-12-07 NOTE — Progress Notes (Signed)
Patient ID: Bryan Knight, male   DOB: 11/15/1945, 72 y.o.   MRN: [id number]  Complaint:  Visit Type: Patient returns to my office for continued preventative foot care services. Complaint: Patient states" my nails have grown long and thick and become painful to walk and wear shoes" Patient has been diagnosed with DM with no complications. He presents for preventative foot care services. No changes to ROS  Podiatric Exam: Vascular: dorsalis pedis and posterior tibial pulses are palpable bilateral. Capillary return is immediate. Temperature gradient is WNL. Skin turgor WNL  Sensorium: Normal Semmes Weinstein monofilament test. Normal tactile sensation bilaterally. Nail Exam: Pt has thick disfigured discolored nails with subungual debris noted bilateral entire nail hallux through fifth toenails Ulcer Exam: There is no evidence of ulcer or pre-ulcerative changes or infection. Orthopedic Exam: Muscle tone and strength are WNL. No limitations in general ROM. No crepitus or effusions noted. Foot type and digits show no abnormalities. Bony prominences are unremarkable. Skin: No Porokeratosis. No infection or ulcers  Diagnosis:  Tinea unguium, Pain in right toe, pain in left toes  Treatment & Plan Procedures and Treatment: Consent by patient was obtained for treatment procedures. The patient understood the discussion of treatment and procedures well. All questions were answered thoroughly reviewed. Debridement of mycotic and hypertrophic toenails, 1 through 5 bilateral and clearing of subungual debris. No ulceration, no infection noted.  Return Visit-Office Procedure: Patient instructed to return to the office for a follow up visit 3 months for continued evaluation and treatment.   Treylin Burtch DPM 

## 2017-12-24 ENCOUNTER — Encounter: Payer: Self-pay | Admitting: Physician Assistant

## 2018-01-01 ENCOUNTER — Encounter: Payer: Self-pay | Admitting: Physician Assistant

## 2018-01-01 ENCOUNTER — Ambulatory Visit (INDEPENDENT_AMBULATORY_CARE_PROVIDER_SITE_OTHER): Payer: Medicare Other | Admitting: Physician Assistant

## 2018-01-01 ENCOUNTER — Other Ambulatory Visit: Payer: Self-pay

## 2018-01-01 VITALS — BP 136/72 | HR 74 | Temp 97.9°F | Resp 16 | Ht 70.0 in | Wt 169.6 lb

## 2018-01-01 DIAGNOSIS — I10 Essential (primary) hypertension: Secondary | ICD-10-CM

## 2018-01-01 DIAGNOSIS — E1122 Type 2 diabetes mellitus with diabetic chronic kidney disease: Secondary | ICD-10-CM | POA: Diagnosis not present

## 2018-01-01 DIAGNOSIS — N183 Chronic kidney disease, stage 3 (moderate): Secondary | ICD-10-CM

## 2018-01-01 DIAGNOSIS — E782 Mixed hyperlipidemia: Secondary | ICD-10-CM | POA: Diagnosis not present

## 2018-01-01 DIAGNOSIS — E118 Type 2 diabetes mellitus with unspecified complications: Secondary | ICD-10-CM

## 2018-01-01 LAB — HEMOGLOBIN A1C
Est. average glucose Bld gHb Est-mCnc: 146 mg/dL
Hgb A1c MFr Bld: 6.7 % — ABNORMAL HIGH (ref 4.8–5.6)

## 2018-01-01 LAB — CBC WITH DIFFERENTIAL/PLATELET
BASOS ABS: 0 10*3/uL (ref 0.0–0.2)
Basos: 0 %
EOS (ABSOLUTE): 0.4 10*3/uL (ref 0.0–0.4)
Eos: 5 %
Hematocrit: 40.8 % (ref 37.5–51.0)
Hemoglobin: 13.8 g/dL (ref 13.0–17.7)
IMMATURE GRANS (ABS): 0 10*3/uL (ref 0.0–0.1)
IMMATURE GRANULOCYTES: 0 %
LYMPHS: 15 %
Lymphocytes Absolute: 1.4 10*3/uL (ref 0.7–3.1)
MCH: 29.2 pg (ref 26.6–33.0)
MCHC: 33.8 g/dL (ref 31.5–35.7)
MCV: 86 fL (ref 79–97)
MONOS ABS: 1 10*3/uL — AB (ref 0.1–0.9)
Monocytes: 11 %
NEUTROS PCT: 69 %
Neutrophils Absolute: 6.2 10*3/uL (ref 1.4–7.0)
PLATELETS: 289 10*3/uL (ref 150–379)
RBC: 4.73 x10E6/uL (ref 4.14–5.80)
RDW: 14.1 % (ref 12.3–15.4)
WBC: 9.1 10*3/uL (ref 3.4–10.8)

## 2018-01-01 LAB — COMPREHENSIVE METABOLIC PANEL
A/G RATIO: 2 (ref 1.2–2.2)
ALK PHOS: 56 IU/L (ref 39–117)
ALT: 18 IU/L (ref 0–44)
AST: 19 IU/L (ref 0–40)
Albumin: 4.1 g/dL (ref 3.5–4.8)
BILIRUBIN TOTAL: 0.4 mg/dL (ref 0.0–1.2)
BUN/Creatinine Ratio: 13 (ref 10–24)
BUN: 31 mg/dL — ABNORMAL HIGH (ref 8–27)
CALCIUM: 9.7 mg/dL (ref 8.6–10.2)
CHLORIDE: 97 mmol/L (ref 96–106)
CO2: 25 mmol/L (ref 20–29)
Creatinine, Ser: 2.36 mg/dL — ABNORMAL HIGH (ref 0.76–1.27)
GFR calc Af Amer: 31 mL/min/{1.73_m2} — ABNORMAL LOW (ref >59)
GFR calc non Af Amer: 27 mL/min/{1.73_m2} — ABNORMAL LOW (ref >59)
Globulin, Total: 2.1 g/dL (ref 1.5–4.5)
Glucose: 193 mg/dL — ABNORMAL HIGH (ref 65–99)
POTASSIUM: 3.4 mmol/L — AB (ref 3.5–5.2)
SODIUM: 142 mmol/L (ref 134–144)
Total Protein: 6.2 g/dL (ref 6.0–8.5)

## 2018-01-01 LAB — LIPID PANEL
CHOL/HDL RATIO: 3 ratio (ref 0.0–5.0)
Cholesterol, Total: 125 mg/dL (ref 100–199)
HDL: 42 mg/dL (ref >39)
LDL CALC: 57 mg/dL (ref 0–99)
TRIGLYCERIDES: 131 mg/dL (ref 0–149)
VLDL CHOLESTEROL CAL: 26 mg/dL (ref 5–40)

## 2018-01-01 NOTE — Patient Instructions (Addendum)
Don't take ibuprofen (Advil, Motrin) or naproxen (Aleve). You CAN take acetaminophen (Tylenol).    IF you received an x-ray today, you will receive an invoice from [facility]. Please contact [facility] at [phone] with questions or concerns regarding your invoice.   IF you received labwork today, you will receive an invoice from [facility]. Please contact [facility] at [phone] with questions or concerns regarding your invoice.   Our billing staff will not be able to assist you with questions regarding bills from these companies.  You will be contacted with the lab results as soon as they are available. The fastest way to get your results is to activate your My Chart account. Instructions are located on the last page of this paperwork. If you have not heard from [place] regarding the results in 2 weeks, please contact this office.

## 2018-01-01 NOTE — Progress Notes (Signed)
Patient ID: Bryan Knight, male    DOB: Aug 02, 1946, 72 y.o.   MRN: [id number]  PCP: Harrison Mons, PA-C  Chief Complaint  Patient presents with  . Hypertension    follow up    Subjective:   Presents for evaluation of diabetes, HTN and hyperlipidemia.  Followed by nephrology for CKD. Follow-up in 01/2018.  Continues to snack of junk food and drinks soda. Meals are healthy, prepared by his wife. Home checks 110's-160's. Tolerating medications without adverse effects. Walks 1 mile, four times/week, and expects to get more exercise with warmer weather.  Sees cardiology in August.  "I must be getting old". "I can't make a fist, primarily due to pain, but also just can't."  Overdue for colonoscopy. Refuses.      Review of Systems     Patient Active Problem List   Diagnosis Date Noted  . Mixed hyperlipidemia 04/25/2017  . Rhinophyma 03/27/2017  . Abdominal aortic ectasia ([place]) 07/05/2016  . Shortness of breath 04/21/2016  . CKD stage 3 due to type 2 diabetes mellitus ([place]) 04/05/2016  . Knee pain, left 10/19/2015  . Diastolic dysfunction 18/29/9371  . Microalbuminuria 04/02/2015  . Depression 04/02/2015  . B12 deficiency 01/11/2012  . Hypertension 10/20/2011  . Type 2 diabetes mellitus with complication, without long-term current use of insulin ([place]) 10/20/2011  . Hypothyroid 10/20/2011  . Rosacea 10/20/2011     Prior to Admission medications   Medication Sig Start Date End Date Taking? Authorizing Provider  Alcohol Swabs (ALCOHOL PREP) PADS Use daily for insulin injection 06/09/16  Yes Alpa Salvo, PA-C  amLODipine (NORVASC) 10 MG tablet TAKE 1 TABLET (10 MG TOTAL) BY MOUTH DAILY. 08/20/17  Yes Tawonda Legaspi, PA-C  aspirin 81 MG tablet Take 81 mg by mouth daily.    Yes [provider]  atorvastatin (LIPITOR) 20 MG tablet TAKE 1 TABLET (20 MG TOTAL) BY MOUTH DAILY. 11/26/17  Yes Kardell Virgil, PA-C  chlorthalidone (HYGROTON) 50 MG tablet  Take 50 mg by mouth daily. 12/03/15  Yes [provider]  DULoxetine (CYMBALTA) 30 MG capsule Take 1 capsule (30 mg total) by mouth daily. 11/10/17  Yes Lennis Korb, PA-C  hydrALAZINE (APRESOLINE) 100 MG tablet Take 100 mg by mouth 3 (three) times daily.  05/29/16  Yes [provider]  Insulin Glargine (BASAGLAR KWIKPEN) 100 UNIT/ML SOPN INJECT 0.1 MLS (10 UNITS TOTAL) INTO THE SKIN AT BEDTIME. 02/19/17  Yes Latrisha Coiro, PA-C  Insulin Glargine (BASAGLAR KWIKPEN) 100 UNIT/ML SOPN INJECT 0.1 MLS (10 UNITS TOTAL) INTO THE SKIN AT BEDTIME. 06/07/17  Yes Lavoris Canizales, PA-C  Insulin Pen Needle (EASY TOUCH PEN NEEDLES) 31G X 8 MM MISC 1 each by Does not apply route daily. 04/25/17  Yes Lolamae Voisin, PA-C  JANUVIA 100 MG tablet TAKE 1 TABLET DAILY 01/18/17  Yes Ridley Schewe, PA-C  levothyroxine (SYNTHROID, LEVOTHROID) 75 MCG tablet TAKE 1 TABLET (75 MCG TOTAL) BY MOUTH DAILY. 10/17/17  Yes Jakita Dutkiewicz, PA-C  lisinopril (PRINIVIL,ZESTRIL) 40 MG tablet Take 20 mg by mouth 2 (two) times daily.  04/17/16  Yes [provider]  metFORMIN (GLUCOPHAGE) 500 MG tablet TAKE 1 TABLET (500 MG TOTAL) BY MOUTH 2 (TWO) TIMES DAILY WITH A MEAL. 11/26/17  Yes Talley Kreiser, PA-C  metoprolol succinate (TOPROL-XL) 100 MG 24 hr tablet TAKE 1 TABLET (100 MG TOTAL) BY MOUTH DAILY. TAKE WITH OR IMMEDIATELY FOLLOWING A MEAL. 11/29/17  Yes Meghan Tiemann, PA-C  montelukast (SINGULAIR) 10 MG tablet TAKE ONE TABLET  BY MOUTH NIGHTLY AT BEDTIME 03/20/17  Yes Kinza Gouveia, PA-C  ONETOUCH DELICA LANCETS 00T MISC USE TO TEST ONCE DAILY AS DIRECTED**MAX DAYS SUPPLY 90** 08/03/16  Yes Yisrael Obryan, PA-C  ONETOUCH DELICA LANCETS 62U MISC Test blood sugar once daily. Dx E11.8 09/03/17  Yes Litzy Dicker, PA-C  ONETOUCH VERIO test strip TEST BLOOD SUGAR ONCE DAILY. 08/21/17  Yes Harrison Mons, PA-C     Allergies  Allergen Reactions  . Citalopram     Ineffective  . Tussionex Pennkinetic Er  [Hydrocod Polst-Cpm Polst Er] Other (See Comments)    Foggy headed  . Wellbutrin [Bupropion Hcl] Other (See Comments)    Sluggish        Objective:  Physical Exam  Constitutional: He is oriented to person, place, and time. He appears well-developed and well-nourished. He is active and cooperative. No distress.  BP 136/72   Pulse 74   Temp 97.9 F (36.6 C)   Resp 16   Ht 5\' 10"  (1.778 m)   Wt 169 lb 9.6 oz (76.9 kg)   SpO2 98%   BMI 24.34 kg/m   HENT:  Head: Normocephalic and atraumatic.  Right Ear: Hearing normal.  Left Ear: Hearing normal.  Eyes: Conjunctivae are normal. No scleral icterus.  Neck: Normal range of motion. Neck supple. No thyromegaly present.  Cardiovascular: Normal rate, regular rhythm and normal heart sounds.  Pulses:      Radial pulses are 2+ on the right side, and 2+ on the left side.  Pulmonary/Chest: Effort normal and breath sounds normal.  Musculoskeletal:       Right wrist: Normal.       Left wrist: Normal.       Right hand: Normal. Normal sensation noted. Normal strength noted.       Left hand: Normal. Normal sensation noted. Normal strength noted.  Lymphadenopathy:       Head (right side): No tonsillar, no preauricular, no posterior auricular and no occipital adenopathy present.       Head (left side): No tonsillar, no preauricular, no posterior auricular and no occipital adenopathy present.    He has no cervical adenopathy.       Right: No supraclavicular adenopathy present.       Left: No supraclavicular adenopathy present.  Neurological: He is alert and oriented to person, place, and time. He has normal strength. No sensory deficit.  Skin: Skin is warm, dry and intact. No rash noted. No cyanosis or erythema. Nails show no clubbing.  Psychiatric: He has a normal mood and affect. His speech is normal and behavior is normal.           Assessment & Plan:   Problem List Items Addressed This Visit    Hypertension    Controlled. Continue  per nephrology.      Relevant Orders   CBC with Differential/Platelet (Completed)   Type 2 diabetes mellitus with complication, without long-term current use of insulin ([place]) - Primary    Await labs. Adjust regimen as indicated by results. Reminded him to limit sugars and simple carbohydrates. Continue medications and increase exercise with warmer weather, as planned.      Relevant Orders   Hemoglobin A1c (Completed)   Comprehensive metabolic panel (Completed)   CKD stage 3 due to type 2 diabetes mellitus (HCC)   Relevant Orders   Comprehensive metabolic panel (Completed)   Mixed hyperlipidemia   Relevant Orders   Lipid panel (Completed)       Return in about 3  months (around 04/02/2018) for diabetes, blood pressure, choelsterol re-evaluation.   Fara Chute, PA-C [facility]

## 2018-01-01 NOTE — Progress Notes (Signed)
Subjective:    Patient ID: Bryan Knight, male    DOB: 1946-02-02, 72 y.o.   MRN: [id number] Chief Complaint  Patient presents with  . Hypertension    follow up    HPI   72 yo male presents for hypertension follow-up.  Hypertension - BP this AM at home 143/79. Has not checked at home other than today. BP today in oficce 136/72.  Diabetes - Blood sugars in the AM per pt records - 110's (lowest) up to 168. Averaging around 130-140s. Reports no problems with insulin or other medications. Taking 10 units of glargine at night. Diet - Reports eating junk food daily (chips, candy). Breakfast everyday - cheerios/banana, lunch - sandwich (white bread), dinner - wife cooks "good meals, not junk". Reports drinking diet soda (2 cans/day) Exercise - reports walking 1 mile 4x/week. Will continue/increase amount of exercise once weather gets better.  Denies polyuria, polydipsia, dizziness, syncope, light-headedness.  Hyperlipidemia - Doing well. Denies myalgias from statin.   CKDIII - Reports nephrologist questioned why he was taking his BP at home, so he quit. Reports last seeing nephrologist around Feb 2019. No changes were done at the time, f/u around end of May, 2019.  Review of Systems  Constitutional: Negative.   HENT: Negative.   Eyes: Negative.   Respiratory: Negative.   Cardiovascular: Negative.   Gastrointestinal: Negative.   Endocrine: Negative for polydipsia, polyphagia and polyuria.  Genitourinary: Negative.   Musculoskeletal: Negative.   Skin: Negative.   Allergic/Immunologic: Negative.   Neurological: Negative.   Hematological: Negative.   Psychiatric/Behavioral: Negative.     Patient Active Problem List   Diagnosis Date Noted  . Mixed hyperlipidemia 04/25/2017  . Rhinophyma 03/27/2017  . Abdominal aortic ectasia ([place]) 07/05/2016  . Shortness of breath 04/21/2016  . CKD stage 3 due to type 2 diabetes mellitus ([place]) 04/05/2016  . Knee pain, left 10/19/2015  .  Diastolic dysfunction 54/27/0623  . Microalbuminuria 04/02/2015  . Depression 04/02/2015  . B12 deficiency 01/11/2012  . Hypertension 10/20/2011  . Type 2 diabetes mellitus with complication, without long-term current use of insulin ([place]) 10/20/2011  . Hypothyroid 10/20/2011  . Rosacea 10/20/2011   Past Medical History:  Diagnosis Date  . Cataract   . Depression   . Diabetes mellitus   . Glaucoma   . Hearing loss   . Hyperlipidemia   . Hypertension   . Rosacea   . Thyroid disease    Prior to Admission medications   Medication Sig Start Date End Date Taking? Authorizing Provider  Alcohol Swabs (ALCOHOL PREP) PADS Use daily for insulin injection 06/09/16  Yes Jeffery, Chelle, PA-C  amLODipine (NORVASC) 10 MG tablet TAKE 1 TABLET (10 MG TOTAL) BY MOUTH DAILY. 08/20/17  Yes Jeffery, Chelle, PA-C  aspirin 81 MG tablet Take 81 mg by mouth daily.    Yes [provider]  atorvastatin (LIPITOR) 20 MG tablet TAKE 1 TABLET (20 MG TOTAL) BY MOUTH DAILY. 11/26/17  Yes Jeffery, Chelle, PA-C  chlorthalidone (HYGROTON) 50 MG tablet Take 50 mg by mouth daily. 12/03/15  Yes [provider]  DULoxetine (CYMBALTA) 30 MG capsule Take 1 capsule (30 mg total) by mouth daily. 11/10/17  Yes Jeffery, Chelle, PA-C  hydrALAZINE (APRESOLINE) 100 MG tablet Take 100 mg by mouth 3 (three) times daily.  05/29/16  Yes [provider]  Insulin Glargine (BASAGLAR KWIKPEN) 100 UNIT/ML SOPN INJECT 0.1 MLS (10 UNITS TOTAL) INTO THE SKIN AT BEDTIME. 02/19/17  Yes Harrison Mons, PA-C  Insulin Glargine (BASAGLAR KWIKPEN) 100 UNIT/ML SOPN INJECT 0.1 MLS (10 UNITS TOTAL) INTO THE SKIN AT BEDTIME. 06/07/17  Yes Jeffery, Chelle, PA-C  Insulin Pen Needle (EASY TOUCH PEN NEEDLES) 31G X 8 MM MISC 1 each by Does not apply route daily. 04/25/17  Yes Jeffery, Chelle, PA-C  JANUVIA 100 MG tablet TAKE 1 TABLET DAILY 01/18/17  Yes Jeffery, Chelle, PA-C  levothyroxine (SYNTHROID, LEVOTHROID) 75 MCG tablet TAKE 1 TABLET  (75 MCG TOTAL) BY MOUTH DAILY. 10/17/17  Yes Jeffery, Chelle, PA-C  lisinopril (PRINIVIL,ZESTRIL) 40 MG tablet Take 20 mg by mouth 2 (two) times daily.  04/17/16  Yes [provider]  metFORMIN (GLUCOPHAGE) 500 MG tablet TAKE 1 TABLET (500 MG TOTAL) BY MOUTH 2 (TWO) TIMES DAILY WITH A MEAL. 11/26/17  Yes Jeffery, Chelle, PA-C  metoprolol succinate (TOPROL-XL) 100 MG 24 hr tablet TAKE 1 TABLET (100 MG TOTAL) BY MOUTH DAILY. TAKE WITH OR IMMEDIATELY FOLLOWING A MEAL. 11/29/17  Yes Jeffery, Chelle, PA-C  montelukast (SINGULAIR) 10 MG tablet TAKE ONE TABLET BY MOUTH NIGHTLY AT BEDTIME 03/20/17  Yes Jeffery, Chelle, PA-C  ONETOUCH DELICA LANCETS 09T MISC USE TO TEST ONCE DAILY AS DIRECTED**MAX DAYS SUPPLY 90** 08/03/16  Yes Jeffery, Chelle, PA-C  ONETOUCH DELICA LANCETS 26Z MISC Test blood sugar once daily. Dx E11.8 09/03/17  Yes Jeffery, Chelle, PA-C  ONETOUCH VERIO test strip TEST BLOOD SUGAR ONCE DAILY. 08/21/17  Yes Harrison Mons, PA-C   Allergies  Allergen Reactions  . Citalopram     Ineffective  . Tussionex Pennkinetic Er [Hydrocod Polst-Cpm Polst Er] Other (See Comments)    Foggy headed  . Wellbutrin [Bupropion Hcl] Other (See Comments)    Sluggish       Objective:   Physical Exam  Constitutional: He is oriented to person, place, and time. He appears well-developed and well-nourished. No distress.  BP 136/72   Pulse 74   Temp 97.9 F (36.6 C)   Resp 16   Ht 5\' 10"  (1.778 m)   Wt 169 lb 9.6 oz (76.9 kg)   SpO2 98%   BMI 24.34 kg/m    HENT:  Head: Normocephalic and atraumatic.  Nose: Nose normal.  Eyes: Pupils are equal, round, and reactive to light. Conjunctivae and EOM are normal. Right eye exhibits no discharge. Left eye exhibits no discharge.  Neck: Normal range of motion. Neck supple. No tracheal deviation present. No thyromegaly present.  Cardiovascular: Normal rate, regular rhythm and intact distal pulses. Exam reveals no gallop and no friction rub.  No murmur  heard. Pulses:      Radial pulses are 2+ on the right side.       Dorsalis pedis pulses are 2+ on the right side.       Posterior tibial pulses are 2+ on the right side.  Pulmonary/Chest: Effort normal and breath sounds normal. No respiratory distress. He has no wheezes. He has no rales.  Musculoskeletal: Normal range of motion. He exhibits no edema.  Neurological: He is alert and oriented to person, place, and time. He has normal reflexes.  Skin: Skin is warm and dry. He is not diaphoretic. No erythema.  Psychiatric: He has a normal mood and affect. His behavior is normal.       Assessment & Plan:  1. Type 2 diabetes mellitus with complication, without long-term current use of insulin (HCC) Continue current meds. Await labs and adjust as needed. - Hemoglobin A1c - Comprehensive metabolic panel  2. CKD stage 3 due to type 2  diabetes mellitus ([place]) Followed by nephrology. - Comprehensive metabolic panel  3. Essential hypertension Controlled on current regimen, continue to monitor. - CBC with Differential/Platelet  4. Mixed hyperlipidemia Await labs and adjust as needed. - Lipid panel  No follow-ups on file.

## 2018-01-05 NOTE — Assessment & Plan Note (Signed)
Goal LDL <70. Await labs. Adjust regimen as indicated by results. Consider increase atorvastatin 20 mg to 40 mg if needed.

## 2018-01-05 NOTE — Assessment & Plan Note (Signed)
Controlled. Continue per nephrology.

## 2018-01-05 NOTE — Assessment & Plan Note (Signed)
Await labs. Adjust regimen as indicated by results. Reminded him to limit sugars and simple carbohydrates. Continue medications and increase exercise with warmer weather, as planned.

## 2018-01-13 ENCOUNTER — Other Ambulatory Visit: Payer: Self-pay | Admitting: Physician Assistant

## 2018-01-13 DIAGNOSIS — E039 Hypothyroidism, unspecified: Secondary | ICD-10-CM

## 2018-02-01 ENCOUNTER — Other Ambulatory Visit: Payer: Self-pay | Admitting: Physician Assistant

## 2018-02-02 ENCOUNTER — Other Ambulatory Visit: Payer: Self-pay | Admitting: Physician Assistant

## 2018-02-09 ENCOUNTER — Other Ambulatory Visit: Payer: Self-pay | Admitting: Physician Assistant

## 2018-02-09 DIAGNOSIS — E118 Type 2 diabetes mellitus with unspecified complications: Secondary | ICD-10-CM

## 2018-02-12 DIAGNOSIS — N183 Chronic kidney disease, stage 3 (moderate): Secondary | ICD-10-CM | POA: Diagnosis not present

## 2018-02-12 NOTE — Telephone Encounter (Signed)
Basaglar 100units/ml Kwikpen refill Last OV: 01/01/18 Last Refill:06/07/17 15 ml 1 RF [facility] [address] Last Hgb A1C: 01/01/18:  6.7

## 2018-02-14 DIAGNOSIS — I129 Hypertensive chronic kidney disease with stage 1 through stage 4 chronic kidney disease, or unspecified chronic kidney disease: Secondary | ICD-10-CM | POA: Diagnosis not present

## 2018-02-14 DIAGNOSIS — R809 Proteinuria, unspecified: Secondary | ICD-10-CM | POA: Diagnosis not present

## 2018-02-14 DIAGNOSIS — N183 Chronic kidney disease, stage 3 (moderate): Secondary | ICD-10-CM | POA: Diagnosis not present

## 2018-02-14 DIAGNOSIS — E1122 Type 2 diabetes mellitus with diabetic chronic kidney disease: Secondary | ICD-10-CM | POA: Diagnosis not present

## 2018-02-16 ENCOUNTER — Other Ambulatory Visit: Payer: Self-pay | Admitting: Physician Assistant

## 2018-02-16 DIAGNOSIS — E039 Hypothyroidism, unspecified: Secondary | ICD-10-CM

## 2018-02-23 ENCOUNTER — Other Ambulatory Visit: Payer: Self-pay | Admitting: Physician Assistant

## 2018-02-23 DIAGNOSIS — I517 Cardiomegaly: Secondary | ICD-10-CM

## 2018-02-23 DIAGNOSIS — E039 Hypothyroidism, unspecified: Secondary | ICD-10-CM

## 2018-02-23 DIAGNOSIS — I10 Essential (primary) hypertension: Secondary | ICD-10-CM

## 2018-02-26 ENCOUNTER — Encounter: Payer: Self-pay | Admitting: Physician Assistant

## 2018-02-26 ENCOUNTER — Ambulatory Visit (INDEPENDENT_AMBULATORY_CARE_PROVIDER_SITE_OTHER): Payer: Medicare Other | Admitting: Physician Assistant

## 2018-02-26 ENCOUNTER — Other Ambulatory Visit: Payer: Self-pay

## 2018-02-26 VITALS — BP 110/72 | HR 64 | Temp 97.6°F | Resp 16 | Ht 68.5 in | Wt 163.4 lb

## 2018-02-26 DIAGNOSIS — I10 Essential (primary) hypertension: Secondary | ICD-10-CM

## 2018-02-26 DIAGNOSIS — N183 Chronic kidney disease, stage 3 unspecified: Secondary | ICD-10-CM

## 2018-02-26 DIAGNOSIS — E1122 Type 2 diabetes mellitus with diabetic chronic kidney disease: Secondary | ICD-10-CM | POA: Diagnosis not present

## 2018-02-26 DIAGNOSIS — I517 Cardiomegaly: Secondary | ICD-10-CM | POA: Insufficient documentation

## 2018-02-26 DIAGNOSIS — E118 Type 2 diabetes mellitus with unspecified complications: Secondary | ICD-10-CM | POA: Diagnosis not present

## 2018-02-26 DIAGNOSIS — E039 Hypothyroidism, unspecified: Secondary | ICD-10-CM | POA: Diagnosis not present

## 2018-02-26 MED ORDER — AMLODIPINE BESYLATE 10 MG PO TABS: 10.0000 mg | ORAL_TABLET | Freq: Every day | ORAL | 3 refills | Status: AC

## 2018-02-26 MED ORDER — MONTELUKAST SODIUM 10 MG PO TABS: 10.0000 mg | ORAL_TABLET | Freq: Every day | ORAL | 3 refills | Status: AC

## 2018-02-26 MED ORDER — LEVOTHYROXINE SODIUM 75 MCG PO TABS: 75.0000 ug | ORAL_TABLET | Freq: Every day | ORAL | 3 refills | Status: DC

## 2018-02-26 MED ORDER — METOPROLOL SUCCINATE ER 100 MG PO TB24
100.0000 mg | ORAL_TABLET | Freq: Every day | ORAL | 3 refills | Status: AC
Start: 2018-02-26 — End: ?

## 2018-02-26 NOTE — Assessment & Plan Note (Signed)
Continue per cardiology. 

## 2018-02-26 NOTE — Patient Instructions (Addendum)
Go ahead and call [facility] to schedule your next visit with me there. [phone].   IF you received an x-ray today, you will receive an invoice from [facility]. Please contact [facility] at [phone] with questions or concerns regarding your invoice.   IF you received labwork today, you will receive an invoice from [facility]. Please contact [facility] at [phone] with questions or concerns regarding your invoice.   Our billing staff will not be able to assist you with questions regarding bills from these companies.  You will be contacted with the lab results as soon as they are available. The fastest way to get your results is to activate your My Chart account. Instructions are located on the last page of this paperwork. If you have not heard from us regarding the results in 2 weeks, please contact this office.     

## 2018-02-26 NOTE — Assessment & Plan Note (Signed)
Euthyroid. Normal TSH last week with nephrology.

## 2018-02-26 NOTE — Progress Notes (Signed)
Patient ID: Bryan Knight, male    DOB: 17-Aug-1946, 72 y.o.   MRN: [id number]  PCP: Harrison Mons, PA-C  Chief Complaint  Patient presents with  . Medication Refill    toprol -XL    Subjective:   Presents for evaluation of hypertension.  He also has diabetes.  No new concerns or problems. He is doing generally well, he does have a typically negative world view.  Recent visit with Dr. Joelyn Oms, with nephrology. PTH was normal. eGFR was 29.  7-day average glucose 119. This morning, 134 (ate brownies last night). A1C 6.7% 01/01/2018.     Review of Systems  Constitutional: Negative for activity change, appetite change, fatigue and unexpected weight change.  HENT: Negative for congestion, dental problem, ear pain, hearing loss, mouth sores, postnasal drip, rhinorrhea, sneezing, sore throat, tinnitus and trouble swallowing.   Eyes: Negative for photophobia, pain, redness and visual disturbance.  Respiratory: Negative for cough, chest tightness and shortness of breath.   Cardiovascular: Negative for chest pain, palpitations and leg swelling.  Gastrointestinal: Negative for abdominal pain, blood in stool, constipation, diarrhea, nausea and vomiting.  Endocrine: Negative for cold intolerance, heat intolerance, polydipsia, polyphagia and polyuria.  Genitourinary: Negative for dysuria, frequency, hematuria and urgency.  Musculoskeletal: Negative for arthralgias, gait problem, myalgias and neck stiffness.  Skin: Negative for rash.  Neurological: Negative for dizziness, speech difficulty, weakness, light-headedness, numbness and headaches.  Hematological: Negative for adenopathy.  Psychiatric/Behavioral: Negative for confusion and sleep disturbance. The patient is not nervous/anxious.        Patient Active Problem List   Diagnosis Date Noted  . Mixed hyperlipidemia 04/25/2017  . Rhinophyma 03/27/2017  . Abdominal aortic ectasia ([place]) 07/05/2016  . Shortness of breath  04/21/2016  . CKD stage 3 due to type 2 diabetes mellitus ([place]) 04/05/2016  . Knee pain, left 10/19/2015  . Diastolic dysfunction 32/20/2542  . Microalbuminuria 04/02/2015  . Depression 04/02/2015  . B12 deficiency 01/11/2012  . Hypertension 10/20/2011  . Type 2 diabetes mellitus with complication, without long-term current use of insulin ([place]) 10/20/2011  . Hypothyroid 10/20/2011  . Rosacea 10/20/2011     Prior to Admission medications   Medication Sig Start Date End Date Taking? Authorizing Provider  Alcohol Swabs (ALCOHOL PREP) PADS Use daily for insulin injection 06/09/16  Yes Dimple Bastyr, PA-C  amLODipine (NORVASC) 10 MG tablet TAKE 1 TABLET (10 MG TOTAL) BY MOUTH DAILY. 08/20/17  Yes Ma Munoz, PA-C  aspirin 81 MG tablet Take 81 mg by mouth daily.    Yes [provider]  atorvastatin (LIPITOR) 20 MG tablet TAKE 1 TABLET (20 MG TOTAL) BY MOUTH DAILY. 11/26/17  Yes Liala Codispoti, PA-C  chlorthalidone (HYGROTON) 50 MG tablet Take 50 mg by mouth daily. 12/03/15  Yes [provider]  DULoxetine (CYMBALTA) 30 MG capsule Take 1 capsule (30 mg total) by mouth daily. 11/10/17  Yes Evona Westra, PA-C  hydrALAZINE (APRESOLINE) 100 MG tablet Take 100 mg by mouth 3 (three) times daily.  05/29/16  Yes [provider]  Insulin Glargine (BASAGLAR KWIKPEN) 100 UNIT/ML SOPN INJECT 0.1 MLS (10 UNITS TOTAL) INTO THE SKIN AT BEDTIME. 02/19/17  Yes Shanekia Latella, PA-C  Insulin Pen Needle (EASY TOUCH PEN NEEDLES) 31G X 8 MM MISC 1 each by Does not apply route daily. 04/25/17  Yes Damariz Paganelli, PA-C  JANUVIA 100 MG tablet TAKE 1 TABLET DAILY 02/01/18  Yes Miranda Frese, PA-C  levothyroxine (SYNTHROID, LEVOTHROID) 75 MCG tablet TAKE 1 TABLET (  75 MCG TOTAL) BY MOUTH DAILY. 02/16/18  Yes Laquonda Welby, PA-C  lisinopril (PRINIVIL,ZESTRIL) 40 MG tablet Take 20 mg by mouth 2 (two) times daily.  04/17/16  Yes [provider]  metFORMIN (GLUCOPHAGE) 500 MG tablet TAKE  1 TABLET (500 MG TOTAL) BY MOUTH 2 (TWO) TIMES DAILY WITH A MEAL. 11/26/17  Yes Calel Pisarski, PA-C  metoprolol succinate (TOPROL-XL) 100 MG 24 hr tablet TAKE 1 TABLET (100 MG TOTAL) BY MOUTH DAILY. TAKE WITH OR IMMEDIATELY FOLLOWING A MEAL. 11/29/17  Yes Zaniyah Wernette, PA-C  montelukast (SINGULAIR) 10 MG tablet TAKE ONE TABLET BY MOUTH NIGHTLY AT BEDTIME 03/20/17  Yes Ebony Yorio, PA-C  ONETOUCH DELICA LANCETS 16X MISC USE TO TEST ONCE DAILY AS DIRECTED**MAX DAYS SUPPLY 90** 08/03/16  Yes Lamount Bankson, PA-C  ONETOUCH VERIO test strip TEST BLOOD SUGAR ONCE DAILY. 08/21/17  Yes Elmarie Devlin, PA-C  Insulin Glargine (BASAGLAR KWIKPEN) 100 UNIT/ML SOPN INJECT 0.1 MLS (10 UNITS TOTAL) INTO THE SKIN AT BEDTIME. Patient not taking: Reported on 02/26/2018 02/12/18   Harrison Mons, PA-C  ONETOUCH DELICA LANCETS 09U MISC Test blood sugar once daily. Dx E11.8 Patient not taking: Reported on 02/26/2018 09/03/17   Harrison Mons, PA-C     Allergies  Allergen Reactions  . Citalopram     Ineffective  . Tussionex Pennkinetic Er [Hydrocod Polst-Cpm Polst Er] Other (See Comments)    Foggy headed  . Wellbutrin [Bupropion Hcl] Other (See Comments)    Sluggish        Objective:  Physical Exam  Constitutional: He is oriented to person, place, and time. He appears well-developed and well-nourished. He is active and cooperative. No distress.  BP 110/72   Pulse 64   Temp 97.6 F (36.4 C)   Resp 16   Ht 5' 8.5" (1.74 m)   Wt 163 lb 6.4 oz (74.1 kg)   SpO2 98%   BMI 24.48 kg/m   HENT:  Head: Normocephalic and atraumatic.  Right Ear: Hearing normal.  Left Ear: Hearing normal.  Eyes: Conjunctivae are normal. No scleral icterus.  Neck: Normal range of motion. Neck supple. No thyromegaly present.  Cardiovascular: Normal rate, regular rhythm and normal heart sounds.  Pulses:      Radial pulses are 2+ on the right side, and 2+ on the left side.  Pulmonary/Chest: Effort normal and breath sounds  normal.  Lymphadenopathy:       Head (right side): No tonsillar, no preauricular, no posterior auricular and no occipital adenopathy present.       Head (left side): No tonsillar, no preauricular, no posterior auricular and no occipital adenopathy present.    He has no cervical adenopathy.       Right: No supraclavicular adenopathy present.       Left: No supraclavicular adenopathy present.  Neurological: He is alert and oriented to person, place, and time. No sensory deficit.  Skin: Skin is warm, dry and intact. No rash noted. No cyanosis or erythema. Nails show no clubbing.  Psychiatric: He has a normal mood and affect. His speech is normal and behavior is normal.    Wt Readings from Last 3 Encounters:  02/26/18 163 lb 6.4 oz (74.1 kg)  01/01/18 169 lb 9.6 oz (76.9 kg)  10/02/17 167 lb 12.8 oz (76.1 kg)      Assessment & Plan:   Problem List Items Addressed This Visit    Type 2 diabetes mellitus with complication, without long-term current use of insulin ([place])    Has been  controlled. CMET last week with nephrology was stable. COntinue current treatment. Update A1C at next visit.      LVH (left ventricular hypertrophy)    Continue per cardiology.      Relevant Medications   metoprolol succinate (TOPROL-XL) 100 MG 24 hr tablet   amLODipine (NORVASC) 10 MG tablet   Hypothyroidism    Euthyroid. Normal TSH last week with nephrology.      Relevant Medications   levothyroxine (SYNTHROID, LEVOTHROID) 75 MCG tablet   metoprolol succinate (TOPROL-XL) 100 MG 24 hr tablet   Hypertension - Primary    Well controlled. No changes today.      Relevant Medications   metoprolol succinate (TOPROL-XL) 100 MG 24 hr tablet   amLODipine (NORVASC) 10 MG tablet   CKD stage 3 due to type 2 diabetes mellitus ([facility])    eGFR 29 last week with nephrology. Continue to control blood pressure and diabetes.          Return in about 3 months (around 05/29/2018) for re-evaluation of diabetes,  blood pressure, cholesterol.   Fara Chute, PA-C [facility]

## 2018-02-26 NOTE — Assessment & Plan Note (Signed)
Has been controlled. CMET last week with nephrology was stable. COntinue current treatment. Update A1C at next visit.

## 2018-02-26 NOTE — Assessment & Plan Note (Signed)
Well controlled. No changes today.

## 2018-02-26 NOTE — Assessment & Plan Note (Signed)
eGFR 29 last week with nephrology. Continue to control blood pressure and diabetes.

## 2018-03-08 ENCOUNTER — Encounter: Payer: Self-pay | Admitting: Podiatry

## 2018-03-08 ENCOUNTER — Ambulatory Visit (INDEPENDENT_AMBULATORY_CARE_PROVIDER_SITE_OTHER): Payer: Medicare Other | Admitting: Podiatry

## 2018-03-08 DIAGNOSIS — B351 Tinea unguium: Secondary | ICD-10-CM | POA: Diagnosis not present

## 2018-03-08 DIAGNOSIS — M79676 Pain in unspecified toe(s): Secondary | ICD-10-CM

## 2018-03-08 DIAGNOSIS — E119 Type 2 diabetes mellitus without complications: Secondary | ICD-10-CM

## 2018-03-08 NOTE — Progress Notes (Signed)
Patient ID: Bryan Knight, male   DOB: 06/25/1946, 72 y.o.   MRN: [id number]  Complaint:  Visit Type: Patient returns to my office for continued preventative foot care services. Complaint: Patient states" my nails have grown long and thick and become painful to walk and wear shoes" Patient has been diagnosed with DM with no complications. He presents for preventative foot care services. No changes to ROS  Podiatric Exam: Vascular: dorsalis pedis and posterior tibial pulses are palpable bilateral. Capillary return is immediate. Temperature gradient is WNL. Skin turgor WNL  Sensorium: Normal Semmes Weinstein monofilament test. Normal tactile sensation bilaterally. Nail Exam: Pt has thick disfigured discolored nails with subungual debris noted bilateral entire nail hallux through fifth toenails Ulcer Exam: There is no evidence of ulcer or pre-ulcerative changes or infection. Orthopedic Exam: Muscle tone and strength are WNL. No limitations in general ROM. No crepitus or effusions noted. Foot type and digits show no abnormalities. Bony prominences are unremarkable. Skin: No Porokeratosis. No infection or ulcers  Diagnosis:  Tinea unguium, Pain in right toe, pain in left toes  Treatment & Plan Procedures and Treatment: Consent by patient was obtained for treatment procedures. The patient understood the discussion of treatment and procedures well. All questions were answered thoroughly reviewed. Debridement of mycotic and hypertrophic toenails, 1 through 5 bilateral and clearing of subungual debris. No ulceration, no infection noted.  Return Visit-Office Procedure: Patient instructed to return to the office for a follow up visit 3 months for continued evaluation and treatment.   Tamitha Norell DPM 

## 2018-04-04 ENCOUNTER — Encounter: Payer: Self-pay | Admitting: *Deleted

## 2018-04-04 DIAGNOSIS — D3132 Benign neoplasm of left choroid: Secondary | ICD-10-CM | POA: Diagnosis not present

## 2018-04-04 DIAGNOSIS — E119 Type 2 diabetes mellitus without complications: Secondary | ICD-10-CM | POA: Diagnosis not present

## 2018-04-04 DIAGNOSIS — H26493 Other secondary cataract, bilateral: Secondary | ICD-10-CM | POA: Diagnosis not present

## 2018-04-04 DIAGNOSIS — Z961 Presence of intraocular lens: Secondary | ICD-10-CM | POA: Diagnosis not present

## 2018-04-04 DIAGNOSIS — H40053 Ocular hypertension, bilateral: Secondary | ICD-10-CM | POA: Diagnosis not present

## 2018-04-18 ENCOUNTER — Encounter: Payer: Self-pay | Admitting: *Deleted

## 2018-04-18 NOTE — Progress Notes (Signed)
eGFR If non africn am   29  02/12/2018

## 2018-04-24 ENCOUNTER — Ambulatory Visit (INDEPENDENT_AMBULATORY_CARE_PROVIDER_SITE_OTHER): Payer: Medicare Other | Admitting: Internal Medicine

## 2018-04-24 ENCOUNTER — Encounter: Payer: Self-pay | Admitting: Internal Medicine

## 2018-04-24 VITALS — BP 156/74 | HR 70 | Ht 70.0 in | Wt 168.0 lb

## 2018-04-24 DIAGNOSIS — E782 Mixed hyperlipidemia: Secondary | ICD-10-CM

## 2018-04-24 DIAGNOSIS — E1122 Type 2 diabetes mellitus with diabetic chronic kidney disease: Secondary | ICD-10-CM | POA: Diagnosis not present

## 2018-04-24 DIAGNOSIS — N183 Chronic kidney disease, stage 3 (moderate): Secondary | ICD-10-CM | POA: Diagnosis not present

## 2018-04-24 DIAGNOSIS — I10 Essential (primary) hypertension: Secondary | ICD-10-CM | POA: Diagnosis not present

## 2018-04-24 DIAGNOSIS — E118 Type 2 diabetes mellitus with unspecified complications: Secondary | ICD-10-CM

## 2018-04-24 NOTE — Progress Notes (Signed)
OFFICE NOTE  Chief Complaint:  No complaints  Primary Care Physician: Harrison Mons, PA-C  HPI:  Bryan Knight is a pleasant 72 year old male who is coming referred to me for evaluation of uncontrolled hypertension. He says he's had high blood pressure for over 40 years and has had difficulty controlling it at times. He's been on a number of different medications however recently his blood pressures been difficult to control. In addition he was noted to have some chronic kidney disease with a creatinine of 1.6. Nephrology but cannot get an appointment for 6 months. Currently he is on a regimen of amlodipine 10 mg daily, lisinopril HCTZ 20/12.5 mg and metoprolol succinate 100 mg daily (which was just started by his PCP. He did bring a list of blood pressure readings at home which indicate recent blood pressures in the 170-200/90-100 range. He denies any symptoms with this. Currently his blood pressure is 158/96. He has had some shortness of breath with exertion but no chest pain. He's never had an ischemia workup. He has had 2 renal ultrasounds, last in 2012 which showed normal renal size. These were not renal artery Dopplers and therefore, I cannot comment on whether he could have renal artery stenosis.  [name] returns today for follow-up. Blood pressure still remains poorly controlled. He brought a list of medications and says that the only thing that seems to reduce the blood pressure acutely is clonidine. He was prescribed this by his nephrologist to use as needed for blood pressures over 180. In general his blood pressures are almost over 180 most of time. He reports his shortness of breath is improved somewhat. His echo showed diastolic dysfunction but normal systolic function. Stress testing was negative. He had renal Dopplers which showed normal renal size and parenchyma however renal blood flow was not assessed. I do not however suspect he has renal artery stenosis. He's had  long-standing hypertension which has been difficult to control. He says many years ago a Catapres patch was helpful but he had horrible dry mouth and was taken off of it.  04/21/16  [name] was seen today back in follow-up. Blood pressure is at goal today at 118/82. He had recent adjustment of his medicines, in fact he says that Dr. Mercy Moore decreased his lisinopril to 20 mg twice a day and stopped his clonidine. He brought in a list of blood pressures and they were fairly elevated. Apparently they've been at goal at his office and was at goal here, which makes me believe that his home blood pressure cuff may be inaccurate. I would like to set him up for a blood pressure check with our hypertension pharmacist and I've advised him to bring his blood pressure cuff to check for its calibration against our equipment.  04/25/2017  [name] returns today for follow-up. He is without complaints. He is disappointed that a lot of his doctors ever tired, but assured him that I would not be able to retire anytime soon. Blood pressure is now well controlled at 126/88. He is on however 5 medications for this. He reports stable chronic kidney disease which is present followed by Dr. Mercy Moore however he intends to retire. He had a low risk stress test in 2017 denies chest pain or worsening shortness of breath. His diabetes is well controlled and cholesterol has been at goal with LDL C of 42 recently.  04/24/2018  [name] returns today for follow-up.  He continues to be without complaints.  He  denies chest pain or worsening shortness of breath.  EKG shows sinus rhythm with some PACs, left anterior fascicular block which is unchanged from prior EKGs.  The pressure was elevated somewhat today 156/74 of her rate repeat blood pressure came down to 128/70.  He reports generally good blood pressure control at home.  Lab work in April 2019 showed total cholesterol 125, triglycerides 131, HDL 42 and LDL 57.  Hemoglobin A1c is  reasonably well-controlled at 6.7.  He reports being active and is without complaints.  PMHx:  Past Medical History:  Diagnosis Date  . Cataract   . Depression   . Diabetes mellitus   . Glaucoma   . Hearing loss   . Hyperlipidemia   . Hypertension   . Rosacea   . Thyroid disease     Past Surgical History:  Procedure Laterality Date  . EYE SURGERY    . KNEE ARTHROSCOPY WITH MEDIAL MENISECTOMY Left 11/10/15  . TONSILLECTOMY     over 50 yrs ago    FAMHx:  Family History  Problem Relation Age of Onset  . Hypertension Mother   . Liver cancer Mother 29  . Hypertension Son     SOCHx:   reports that he quit smoking about 50 years ago. His smoking use included cigarettes. He has a 2.00 pack-year smoking history. He has never used smokeless tobacco. He reports that he does not drink alcohol or use drugs.  ALLERGIES:  Allergies  Allergen Reactions  . Citalopram     Ineffective  . Tussionex Pennkinetic Er [Hydrocod Polst-Cpm Polst Er] Other (See Comments)    Foggy headed  . Wellbutrin [Bupropion Hcl] Other (See Comments)    Sluggish     ROS: Pertinent items noted in HPI and remainder of comprehensive ROS otherwise negative.  HOME MEDS: Current Outpatient Medications  Medication Sig Dispense Refill  . Alcohol Swabs (ALCOHOL PREP) PADS Use daily for insulin injection 100 each prn  . amLODipine (NORVASC) 10 MG tablet Take 1 tablet (10 mg total) by mouth daily. 90 tablet 3  . aspirin 81 MG tablet Take 81 mg by mouth daily.     Marland Kitchen atorvastatin (LIPITOR) 20 MG tablet TAKE 1 TABLET (20 MG TOTAL) BY MOUTH DAILY. 90 tablet 1  . chlorthalidone (HYGROTON) 50 MG tablet Take 50 mg by mouth daily.  6  . DULoxetine (CYMBALTA) 30 MG capsule Take 1 capsule (30 mg total) by mouth daily. 90 capsule 3  . hydrALAZINE (APRESOLINE) 100 MG tablet Take 100 mg by mouth 3 (three) times daily.     . Insulin Glargine (BASAGLAR KWIKPEN) 100 UNIT/ML SOPN INJECT 0.1 MLS (10 UNITS TOTAL) INTO THE SKIN AT  BEDTIME. 15 mL 0  . Insulin Pen Needle (EASY TOUCH PEN NEEDLES) 31G X 8 MM MISC 1 each by Does not apply route daily. 100 each 12  . JANUVIA 100 MG tablet TAKE 1 TABLET DAILY 90 tablet 3  . levothyroxine (SYNTHROID, LEVOTHROID) 75 MCG tablet Take 1 tablet (75 mcg total) by mouth daily. 90 tablet 3  . lisinopril (PRINIVIL,ZESTRIL) 40 MG tablet Take 20 mg by mouth 2 (two) times daily.     . metFORMIN (GLUCOPHAGE) 500 MG tablet TAKE 1 TABLET (500 MG TOTAL) BY MOUTH 2 (TWO) TIMES DAILY WITH A MEAL. 180 tablet 1  . metoprolol succinate (TOPROL-XL) 100 MG 24 hr tablet Take 1 tablet (100 mg total) by mouth daily. Take with or immediately following a meal. 90 tablet 3  . montelukast (SINGULAIR) 10 MG  tablet Take 1 tablet (10 mg total) by mouth at bedtime. 90 tablet 3  . ONETOUCH DELICA LANCETS 51Z MISC USE TO TEST ONCE DAILY AS DIRECTED**MAX DAYS SUPPLY 90** 100 each 0  . ONETOUCH VERIO test strip TEST BLOOD SUGAR ONCE DAILY. 100 each 3   No current facility-administered medications for this visit.     LABS/IMAGING: No results found for this or any previous visit (from the past 48 hour(s)). No results found.  WEIGHTS: Wt Readings from Last 3 Encounters:  04/24/18 168 lb (76.2 kg)  02/26/18 163 lb 6.4 oz (74.1 kg)  01/01/18 169 lb 9.6 oz (76.9 kg)    VITALS: BP (!) 156/74   Pulse 70   Ht 5\' 10"  (1.778 m)   Wt 168 lb (76.2 kg)   BMI 24.11 kg/m   EXAM: General appearance: alert and no distress Neck: no carotid bruit, no JVD and thyroid not enlarged, symmetric, no tenderness/mass/nodules Lungs: clear to auscultation bilaterally Heart: regular rate and rhythm, S1, S2 normal, no murmur, click, rub or gallop Abdomen: soft, non-tender; bowel sounds normal; no masses,  no organomegaly Extremities: extremities normal, atraumatic, no cyanosis or edema Pulses: 2+ and symmetric Skin: Skin color, texture, turgor normal. No rashes or lesions Neurologic: Grossly normal Psych:  Pleasant  EKG: Sinus rhythm with PACs at 70, LAFB, moderate voltage criteria for LVH-personally reviewed  ASSESSMENT: 1. Hypertension - now at goal 2. Low risk nuclear stress test-echo with mild diastolic dysfunction (0/0174) 3. CKD 2 4. DM2 5. Dyslipidemia  PLAN: 1.   Mr. Brunty continues to do well without any active chest pain.  He has stable chronic kidney disease, reasonably well controlled diabetes, and this lipidemia which is a goal.  Blood pressure appears to be well controlled as well.  No changes made to his medications today.  Follow-up with me annually or sooner as necessary.  Pixie Casino, MD, [facility], [place] Director of the [facility]  [facility] Diplomate of the American Board of Clinical Lipidology Attending Cardiologist  Direct Dial: [phone]  Fax: [phone]  Website:  [email or web address] 04/24/2018, 10:55 AM

## 2018-04-24 NOTE — Patient Instructions (Signed)
Your physician wants you to follow-up in: ONE YEAR with Dr. Hilty. You will receive a reminder letter in the mail two months in advance. If you don't receive a letter, please call our office to schedule the follow-up appointment.  

## 2018-06-03 DIAGNOSIS — I10 Essential (primary) hypertension: Secondary | ICD-10-CM | POA: Diagnosis not present

## 2018-06-03 DIAGNOSIS — E782 Mixed hyperlipidemia: Secondary | ICD-10-CM | POA: Diagnosis not present

## 2018-06-03 DIAGNOSIS — E1122 Type 2 diabetes mellitus with diabetic chronic kidney disease: Secondary | ICD-10-CM | POA: Diagnosis not present

## 2018-06-03 DIAGNOSIS — E118 Type 2 diabetes mellitus with unspecified complications: Secondary | ICD-10-CM | POA: Diagnosis not present

## 2018-06-03 DIAGNOSIS — N183 Chronic kidney disease, stage 3 (moderate): Secondary | ICD-10-CM | POA: Diagnosis not present

## 2018-06-03 DIAGNOSIS — E039 Hypothyroidism, unspecified: Secondary | ICD-10-CM | POA: Diagnosis not present

## 2018-06-03 DIAGNOSIS — I77811 Abdominal aortic ectasia: Secondary | ICD-10-CM | POA: Diagnosis not present

## 2018-06-14 ENCOUNTER — Encounter: Payer: Self-pay | Admitting: Podiatry

## 2018-06-14 ENCOUNTER — Ambulatory Visit (INDEPENDENT_AMBULATORY_CARE_PROVIDER_SITE_OTHER): Payer: Medicare Other | Admitting: Podiatry

## 2018-06-14 DIAGNOSIS — B351 Tinea unguium: Secondary | ICD-10-CM

## 2018-06-14 DIAGNOSIS — E119 Type 2 diabetes mellitus without complications: Secondary | ICD-10-CM

## 2018-06-14 DIAGNOSIS — M79676 Pain in unspecified toe(s): Secondary | ICD-10-CM

## 2018-06-14 NOTE — Progress Notes (Signed)
Patient ID: Bryan Knight, male   DOB: [birth year], 72 y.o.   MRN: [id number]  Complaint:  Visit Type: Patient returns to my office for continued preventative foot care services. Complaint: Patient states" my nails have grown long and thick and become painful to walk and wear shoes" Patient has been diagnosed with DM with no complications. He presents for preventative foot care services. No changes to ROS  Podiatric Exam: Vascular: dorsalis pedis and posterior tibial pulses are palpable bilateral. Capillary return is immediate. Temperature gradient is WNL. Skin turgor WNL  Sensorium: Normal Semmes Weinstein monofilament test. Normal tactile sensation bilaterally. Nail Exam: Pt has thick disfigured discolored nails with subungual debris noted bilateral entire nail hallux through fifth toenails Ulcer Exam: There is no evidence of ulcer or pre-ulcerative changes or infection. Orthopedic Exam: Muscle tone and strength are WNL. No limitations in general ROM. No crepitus or effusions noted. Foot type and digits show no abnormalities. Bony prominences are unremarkable. Skin: No Porokeratosis. No infection or ulcers  Diagnosis:  Tinea unguium, Pain in right toe, pain in left toes  Treatment & Plan Procedures and Treatment: Consent by patient was obtained for treatment procedures. The patient understood the discussion of treatment and procedures well. All questions were answered thoroughly reviewed. Debridement of mycotic and hypertrophic toenails, 1 through 5 bilateral and clearing of subungual debris. No ulceration, no infection noted.  Return Visit-Office Procedure: Patient instructed to return to the office for a follow up visit 3 months for continued evaluation and treatment.   Gardiner Barefoot DPM

## 2018-07-29 DIAGNOSIS — N183 Chronic kidney disease, stage 3 (moderate): Secondary | ICD-10-CM | POA: Diagnosis not present

## 2018-08-06 IMAGING — US US ABDOMINAL AORTA SCREENING AAA
1 series · 9 of 9 positions shown · non-contrast
Comparison: Ultrasound 10/01/2015.

CLINICAL DATA: Screening for AAA.

EXAM:
ULTRASOUND OF ABDOMINAL AORTA
TECHNIQUE: Ultrasound examination of the abdominal aorta was performed to
evaluate for abdominal aortic aneurysm.

[Series 1: us abdominal aorta screening aaa · 0.26mm/px · 9 of 9 slices shown]
[im 1/9]
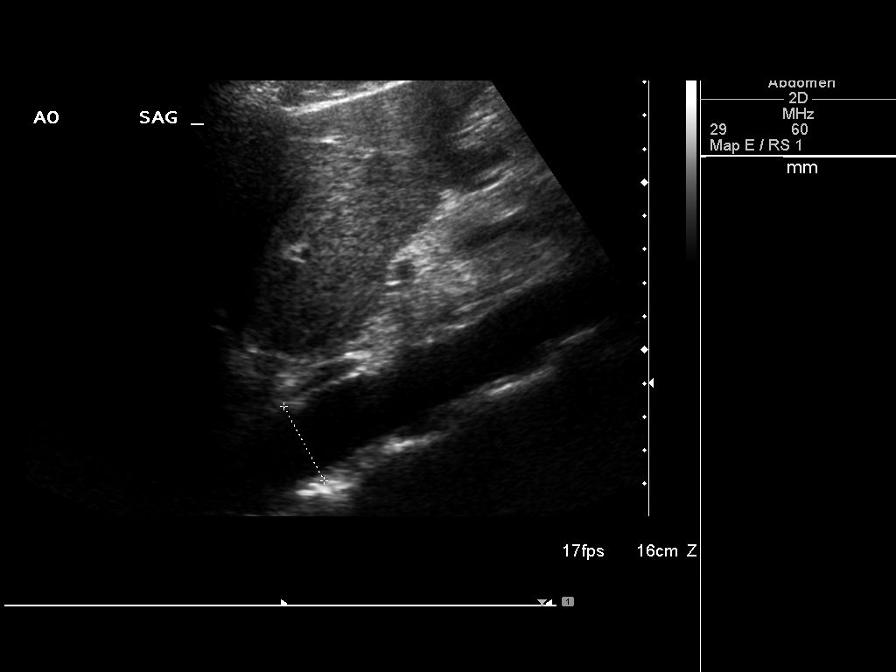
[im 2/9]
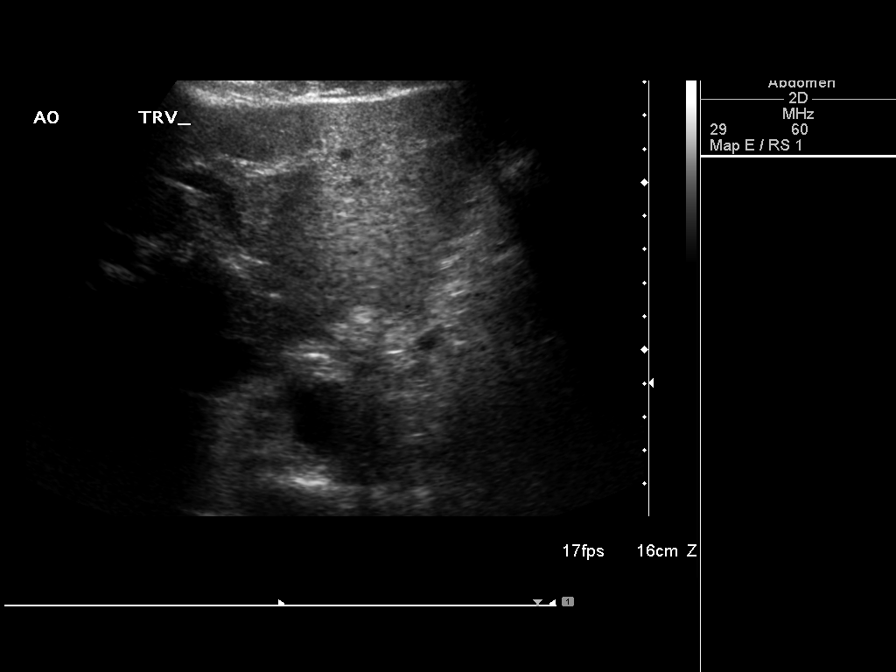
[im 3/9]
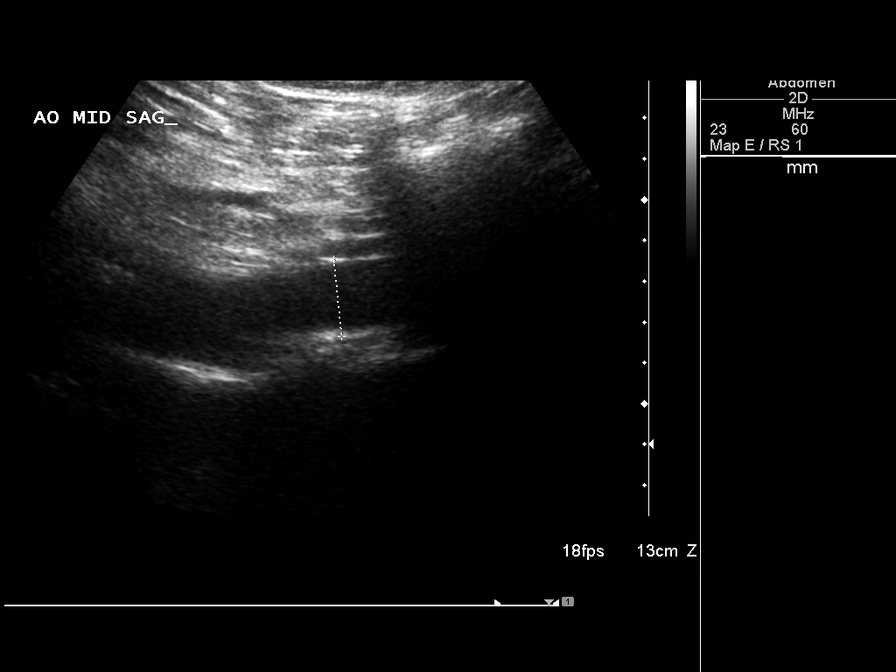
[im 4/9]
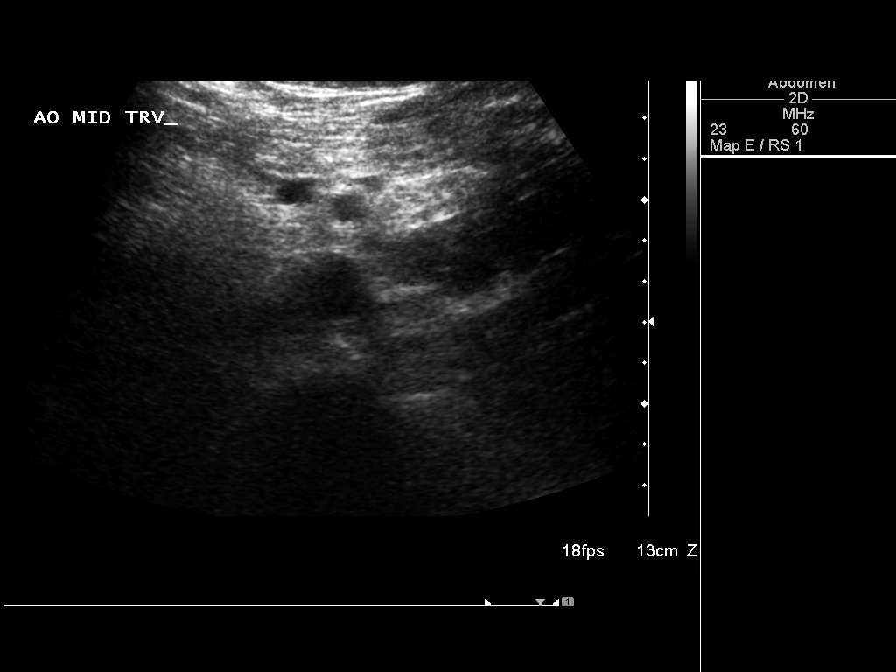
[im 5/9]
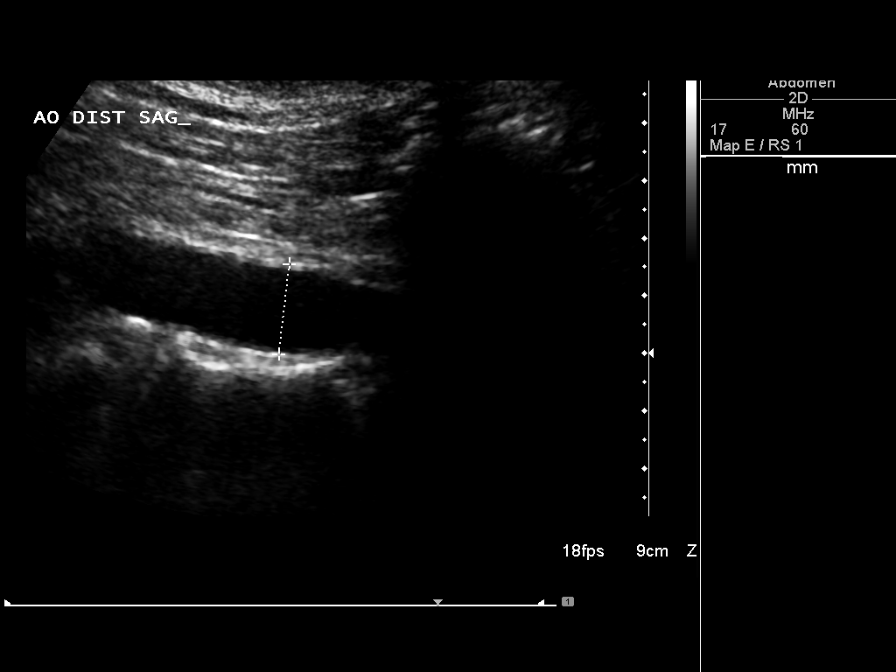
[im 6/9]
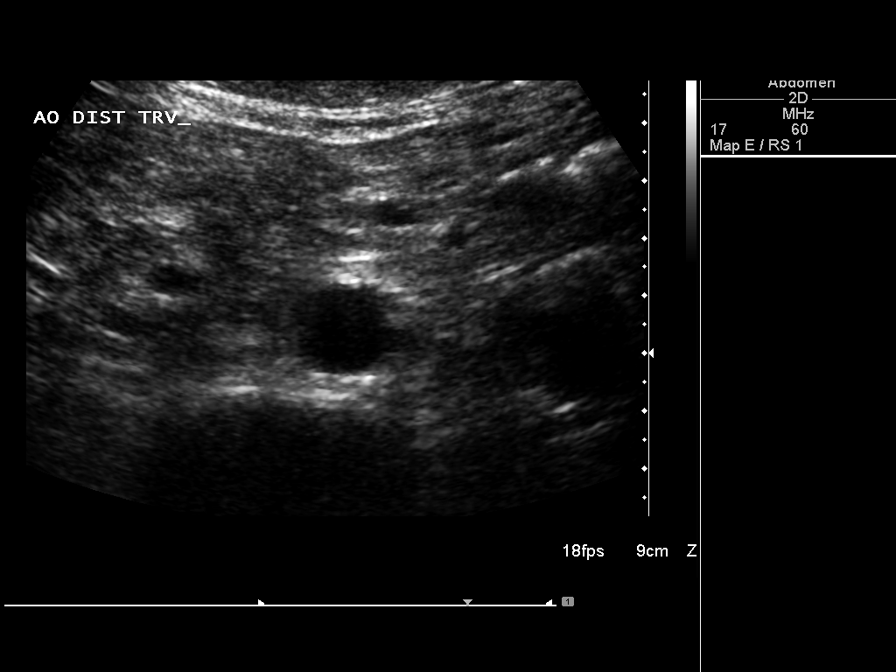
[im 7/9]
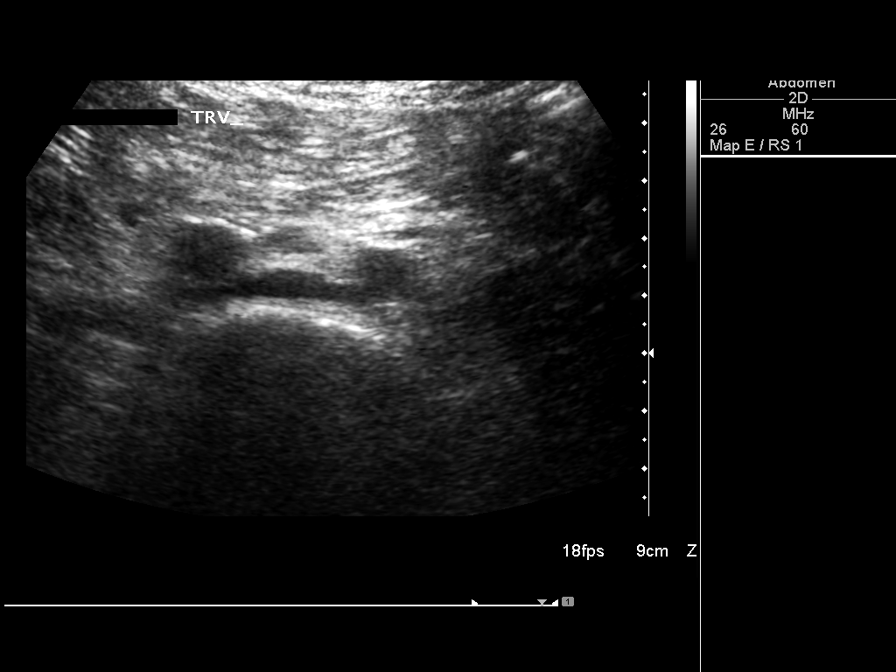
[im 8/9]
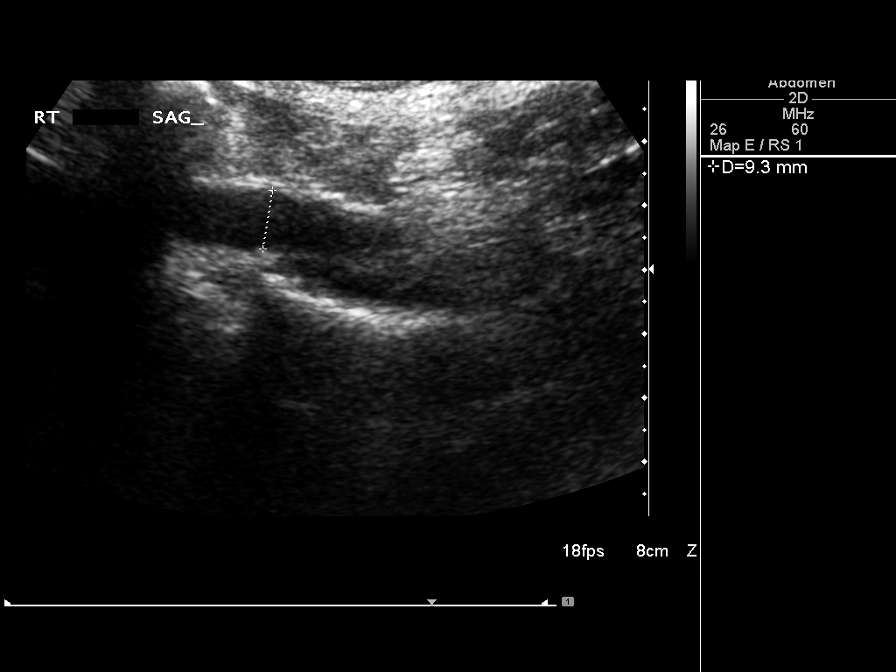
[im 9/9]
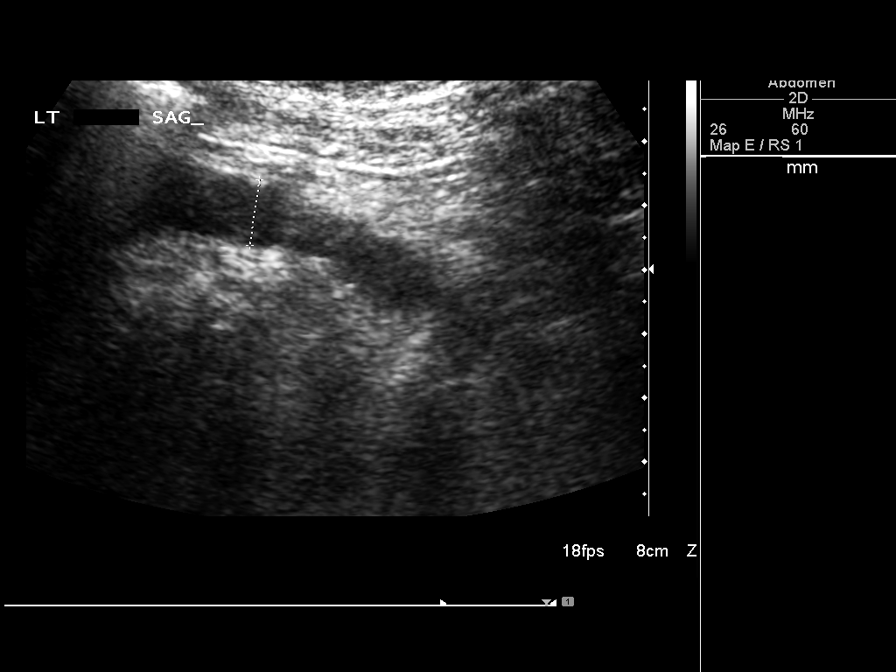

[9 of 9 positions shown; findings below may reference images not displayed]

FINDINGS: Abdominal Aorta

No aneurysm identified.  Mild abdominal aortic ectasia to 2.5 cm.

Maximum Diameter: 2.5 cm
IMPRESSION: Mild abdominal aortic ectasia to 2.5 cm. Ectatic abdominal aorta at
risk for aneurysm development. Recommend followup by ultrasound in 5
years. This recommendation follows ACR consensus guidelines: White
Paper of the ACR Incidental Findings Committee II on Vascular
Findings. [HOSPITAL] 2687; [DATE].

## 2018-08-09 DIAGNOSIS — N183 Chronic kidney disease, stage 3 (moderate): Secondary | ICD-10-CM | POA: Diagnosis not present

## 2018-08-09 DIAGNOSIS — I129 Hypertensive chronic kidney disease with stage 1 through stage 4 chronic kidney disease, or unspecified chronic kidney disease: Secondary | ICD-10-CM | POA: Diagnosis not present

## 2018-08-09 DIAGNOSIS — R809 Proteinuria, unspecified: Secondary | ICD-10-CM | POA: Diagnosis not present

## 2018-08-09 DIAGNOSIS — E1122 Type 2 diabetes mellitus with diabetic chronic kidney disease: Secondary | ICD-10-CM | POA: Diagnosis not present

## 2018-09-02 DIAGNOSIS — E782 Mixed hyperlipidemia: Secondary | ICD-10-CM | POA: Diagnosis not present

## 2018-09-02 DIAGNOSIS — I77811 Abdominal aortic ectasia: Secondary | ICD-10-CM | POA: Diagnosis not present

## 2018-09-02 DIAGNOSIS — E039 Hypothyroidism, unspecified: Secondary | ICD-10-CM | POA: Diagnosis not present

## 2018-09-02 DIAGNOSIS — E1122 Type 2 diabetes mellitus with diabetic chronic kidney disease: Secondary | ICD-10-CM | POA: Diagnosis not present

## 2018-09-02 DIAGNOSIS — I10 Essential (primary) hypertension: Secondary | ICD-10-CM | POA: Diagnosis not present

## 2018-09-02 DIAGNOSIS — Z0000 Encounter for general adult medical examination without abnormal findings: Secondary | ICD-10-CM | POA: Diagnosis not present

## 2018-09-02 DIAGNOSIS — N183 Chronic kidney disease, stage 3 (moderate): Secondary | ICD-10-CM | POA: Diagnosis not present

## 2018-09-02 DIAGNOSIS — E118 Type 2 diabetes mellitus with unspecified complications: Secondary | ICD-10-CM | POA: Diagnosis not present

## 2018-09-02 DIAGNOSIS — Z794 Long term (current) use of insulin: Secondary | ICD-10-CM | POA: Diagnosis not present

## 2018-09-20 ENCOUNTER — Ambulatory Visit (INDEPENDENT_AMBULATORY_CARE_PROVIDER_SITE_OTHER): Payer: Medicare Other | Admitting: Podiatry

## 2018-09-20 ENCOUNTER — Encounter: Payer: Self-pay | Admitting: Podiatry

## 2018-09-20 DIAGNOSIS — M79676 Pain in unspecified toe(s): Secondary | ICD-10-CM | POA: Diagnosis not present

## 2018-09-20 DIAGNOSIS — B351 Tinea unguium: Secondary | ICD-10-CM

## 2018-09-20 DIAGNOSIS — E119 Type 2 diabetes mellitus without complications: Secondary | ICD-10-CM

## 2018-09-20 NOTE — Progress Notes (Signed)
Patient ID: Bryan Knight, male   DOB: Jan 14, 1946, 73 y.o.   MRN: [id number]  Complaint:  Visit Type: Patient returns to my office for continued preventative foot care services. Complaint: Patient states" my nails have grown long and thick and become painful to walk and wear shoes" Patient has been diagnosed with DM with no complications. He presents for preventative foot care services. No changes to ROS  Podiatric Exam: Vascular: dorsalis pedis and posterior tibial pulses are palpable bilateral. Capillary return is immediate. Temperature gradient is WNL. Skin turgor WNL  Sensorium: Normal Semmes Weinstein monofilament test. Normal tactile sensation bilaterally. Nail Exam: Pt has thick disfigured discolored nails with subungual debris noted bilateral entire nail hallux through fifth toenails Ulcer Exam: There is no evidence of ulcer or pre-ulcerative changes or infection. Orthopedic Exam: Muscle tone and strength are WNL. No limitations in general ROM. No crepitus or effusions noted. Foot type and digits show no abnormalities. Bony prominences are unremarkable. Skin: No Porokeratosis. No infection or ulcers  Diagnosis:  Tinea unguium, Pain in right toe, pain in left toes  Treatment & Plan Procedures and Treatment: Consent by patient was obtained for treatment procedures. The patient understood the discussion of treatment and procedures well. All questions were answered thoroughly reviewed. Debridement of mycotic and hypertrophic toenails, 1 through 5 bilateral and clearing of subungual debris. No ulceration, no infection noted. ABN signed for 2020. Return Visit-Office Procedure: Patient instructed to return to the office for a follow up visit 3 months for continued evaluation and treatment.   Gardiner Barefoot DPM

## 2018-10-22 DIAGNOSIS — H6121 Impacted cerumen, right ear: Secondary | ICD-10-CM | POA: Diagnosis not present

## 2018-10-22 DIAGNOSIS — R0982 Postnasal drip: Secondary | ICD-10-CM | POA: Diagnosis not present

## 2018-12-03 DIAGNOSIS — Z794 Long term (current) use of insulin: Secondary | ICD-10-CM | POA: Diagnosis not present

## 2018-12-03 DIAGNOSIS — I77811 Abdominal aortic ectasia: Secondary | ICD-10-CM | POA: Diagnosis not present

## 2018-12-03 DIAGNOSIS — E785 Hyperlipidemia, unspecified: Secondary | ICD-10-CM | POA: Diagnosis not present

## 2018-12-03 DIAGNOSIS — E1169 Type 2 diabetes mellitus with other specified complication: Secondary | ICD-10-CM | POA: Diagnosis not present

## 2018-12-03 DIAGNOSIS — E039 Hypothyroidism, unspecified: Secondary | ICD-10-CM | POA: Diagnosis not present

## 2018-12-03 DIAGNOSIS — E118 Type 2 diabetes mellitus with unspecified complications: Secondary | ICD-10-CM | POA: Diagnosis not present

## 2018-12-03 DIAGNOSIS — E1122 Type 2 diabetes mellitus with diabetic chronic kidney disease: Secondary | ICD-10-CM | POA: Diagnosis not present

## 2018-12-03 DIAGNOSIS — I129 Hypertensive chronic kidney disease with stage 1 through stage 4 chronic kidney disease, or unspecified chronic kidney disease: Secondary | ICD-10-CM | POA: Diagnosis not present

## 2018-12-03 DIAGNOSIS — N183 Chronic kidney disease, stage 3 (moderate): Secondary | ICD-10-CM | POA: Diagnosis not present

## 2018-12-20 ENCOUNTER — Encounter: Payer: Self-pay | Admitting: Podiatry

## 2018-12-20 ENCOUNTER — Ambulatory Visit (INDEPENDENT_AMBULATORY_CARE_PROVIDER_SITE_OTHER): Payer: Medicare Other | Admitting: Podiatry

## 2018-12-20 ENCOUNTER — Other Ambulatory Visit: Payer: Self-pay

## 2018-12-20 DIAGNOSIS — B351 Tinea unguium: Secondary | ICD-10-CM

## 2018-12-20 DIAGNOSIS — E119 Type 2 diabetes mellitus without complications: Secondary | ICD-10-CM

## 2018-12-20 DIAGNOSIS — M79676 Pain in unspecified toe(s): Secondary | ICD-10-CM

## 2018-12-20 NOTE — Progress Notes (Signed)
Patient ID: Bryan Knight, male   DOB: May 04, 1946, 73 y.o.   MRN: [id number]  Complaint:  Visit Type: Patient returns to my office for continued preventative foot care services. Complaint: Patient states" my nails have grown long and thick and become painful to walk and wear shoes" Patient has been diagnosed with DM with no complications. He presents for preventative foot care services. No changes to ROS  Podiatric Exam: Vascular: dorsalis pedis and posterior tibial pulses are palpable bilateral. Capillary return is immediate. Temperature gradient is WNL. Skin turgor WNL  Sensorium: Normal Semmes Weinstein monofilament test. Normal tactile sensation bilaterally. Nail Exam: Pt has thick disfigured discolored nails with subungual debris noted bilateral entire nail hallux through fifth toenails Ulcer Exam: There is no evidence of ulcer or pre-ulcerative changes or infection. Orthopedic Exam: Muscle tone and strength are WNL. No limitations in general ROM. No crepitus or effusions noted. Foot type and digits show no abnormalities. Bony prominences are unremarkable. Skin: No Porokeratosis. No infection or ulcers  Diagnosis:  Tinea unguium, Pain in right toe, pain in left toes  Treatment & Plan Procedures and Treatment: Consent by patient was obtained for treatment procedures. The patient understood the discussion of treatment and procedures well. All questions were answered thoroughly reviewed. Debridement of mycotic and hypertrophic toenails, 1 through 5 bilateral and clearing of subungual debris. No ulceration, no infection noted. ABN signed for 2020. Return Visit-Office Procedure: Patient instructed to return to the office for a follow up visit 3 months for continued evaluation and treatment.   Gardiner Barefoot DPM

## 2019-03-05 DIAGNOSIS — Z5320 Procedure and treatment not carried out because of patient's decision for unspecified reasons: Secondary | ICD-10-CM | POA: Insufficient documentation

## 2019-03-05 DIAGNOSIS — N183 Chronic kidney disease, stage 3 (moderate): Secondary | ICD-10-CM | POA: Diagnosis not present

## 2019-03-05 DIAGNOSIS — E039 Hypothyroidism, unspecified: Secondary | ICD-10-CM | POA: Diagnosis not present

## 2019-03-05 DIAGNOSIS — Z794 Long term (current) use of insulin: Secondary | ICD-10-CM | POA: Diagnosis not present

## 2019-03-05 DIAGNOSIS — E785 Hyperlipidemia, unspecified: Secondary | ICD-10-CM | POA: Diagnosis not present

## 2019-03-05 DIAGNOSIS — E118 Type 2 diabetes mellitus with unspecified complications: Secondary | ICD-10-CM | POA: Diagnosis not present

## 2019-03-05 DIAGNOSIS — E1122 Type 2 diabetes mellitus with diabetic chronic kidney disease: Secondary | ICD-10-CM | POA: Diagnosis not present

## 2019-03-05 DIAGNOSIS — E1169 Type 2 diabetes mellitus with other specified complication: Secondary | ICD-10-CM | POA: Diagnosis not present

## 2019-03-05 DIAGNOSIS — I129 Hypertensive chronic kidney disease with stage 1 through stage 4 chronic kidney disease, or unspecified chronic kidney disease: Secondary | ICD-10-CM | POA: Diagnosis not present

## 2019-03-25 ENCOUNTER — Other Ambulatory Visit: Payer: Self-pay

## 2019-03-25 ENCOUNTER — Ambulatory Visit (INDEPENDENT_AMBULATORY_CARE_PROVIDER_SITE_OTHER): Payer: Medicare Other | Admitting: Podiatry

## 2019-03-25 ENCOUNTER — Encounter: Payer: Self-pay | Admitting: Podiatry

## 2019-03-25 DIAGNOSIS — B351 Tinea unguium: Secondary | ICD-10-CM

## 2019-03-25 DIAGNOSIS — M79675 Pain in left toe(s): Secondary | ICD-10-CM

## 2019-03-25 DIAGNOSIS — E118 Type 2 diabetes mellitus with unspecified complications: Secondary | ICD-10-CM

## 2019-03-25 DIAGNOSIS — M79674 Pain in right toe(s): Secondary | ICD-10-CM

## 2019-03-25 NOTE — Progress Notes (Signed)
Patient ID: Bryan Knight, male   DOB: [birth year], 73 y.o.   MRN: [id number]  Complaint:  Visit Type: Patient returns to my office for continued preventative foot care services. Complaint: Patient states" my nails have grown long and thick and become painful to walk and wear shoes" Patient has been diagnosed with DM with no complications. He presents for preventative foot care services. No changes to ROS  Podiatric Exam: Vascular: dorsalis pedis and posterior tibial pulses are palpable bilateral. Capillary return is immediate. Temperature gradient is WNL. Skin turgor WNL  Sensorium: Normal Semmes Weinstein monofilament test. Normal tactile sensation bilaterally. Nail Exam: Pt has thick disfigured discolored nails with subungual debris noted bilateral entire nail hallux through fifth toenails Ulcer Exam: There is no evidence of ulcer or pre-ulcerative changes or infection. Orthopedic Exam: Muscle tone and strength are WNL. No limitations in general ROM. No crepitus or effusions noted. Foot type and digits show no abnormalities. Bony prominences are unremarkable. Skin: No Porokeratosis. No infection or ulcers  Diagnosis:  Tinea unguium, Pain in right toe, pain in left toes  Treatment & Plan Procedures and Treatment: Consent by patient was obtained for treatment procedures. The patient understood the discussion of treatment and procedures well. All questions were answered thoroughly reviewed. Debridement of mycotic and hypertrophic toenails, 1 through 5 bilateral and clearing of subungual debris. No ulceration, no infection noted.  Return Visit-Office Procedure: Patient instructed to return to the office for a follow up visit 3 months for continued evaluation and treatment.   Gardiner Barefoot DPM

## 2019-04-08 DIAGNOSIS — Z961 Presence of intraocular lens: Secondary | ICD-10-CM | POA: Diagnosis not present

## 2019-04-08 DIAGNOSIS — E119 Type 2 diabetes mellitus without complications: Secondary | ICD-10-CM | POA: Diagnosis not present

## 2019-04-08 DIAGNOSIS — H26493 Other secondary cataract, bilateral: Secondary | ICD-10-CM | POA: Diagnosis not present

## 2019-04-08 DIAGNOSIS — H5202 Hypermetropia, left eye: Secondary | ICD-10-CM | POA: Diagnosis not present

## 2019-04-08 DIAGNOSIS — D3132 Benign neoplasm of left choroid: Secondary | ICD-10-CM | POA: Diagnosis not present

## 2019-04-08 DIAGNOSIS — H5211 Myopia, right eye: Secondary | ICD-10-CM | POA: Diagnosis not present

## 2019-04-08 DIAGNOSIS — H40053 Ocular hypertension, bilateral: Secondary | ICD-10-CM | POA: Diagnosis not present

## 2019-04-08 DIAGNOSIS — H524 Presbyopia: Secondary | ICD-10-CM | POA: Diagnosis not present

## 2019-04-15 DIAGNOSIS — N183 Chronic kidney disease, stage 3 (moderate): Secondary | ICD-10-CM | POA: Diagnosis not present

## 2019-04-25 DIAGNOSIS — N183 Chronic kidney disease, stage 3 (moderate): Secondary | ICD-10-CM | POA: Diagnosis not present

## 2019-04-25 DIAGNOSIS — I129 Hypertensive chronic kidney disease with stage 1 through stage 4 chronic kidney disease, or unspecified chronic kidney disease: Secondary | ICD-10-CM | POA: Diagnosis not present

## 2019-04-25 DIAGNOSIS — R809 Proteinuria, unspecified: Secondary | ICD-10-CM | POA: Diagnosis not present

## 2019-04-25 DIAGNOSIS — E559 Vitamin D deficiency, unspecified: Secondary | ICD-10-CM | POA: Diagnosis not present

## 2019-04-25 DIAGNOSIS — E1122 Type 2 diabetes mellitus with diabetic chronic kidney disease: Secondary | ICD-10-CM | POA: Diagnosis not present

## 2019-06-05 DIAGNOSIS — N183 Chronic kidney disease, stage 3 (moderate): Secondary | ICD-10-CM | POA: Diagnosis not present

## 2019-06-05 DIAGNOSIS — E785 Hyperlipidemia, unspecified: Secondary | ICD-10-CM | POA: Diagnosis not present

## 2019-06-05 DIAGNOSIS — M25561 Pain in right knee: Secondary | ICD-10-CM | POA: Diagnosis not present

## 2019-06-05 DIAGNOSIS — E1122 Type 2 diabetes mellitus with diabetic chronic kidney disease: Secondary | ICD-10-CM | POA: Diagnosis not present

## 2019-06-05 DIAGNOSIS — E118 Type 2 diabetes mellitus with unspecified complications: Secondary | ICD-10-CM | POA: Diagnosis not present

## 2019-06-05 DIAGNOSIS — Z5320 Procedure and treatment not carried out because of patient's decision for unspecified reasons: Secondary | ICD-10-CM | POA: Diagnosis not present

## 2019-06-05 DIAGNOSIS — E1169 Type 2 diabetes mellitus with other specified complication: Secondary | ICD-10-CM | POA: Diagnosis not present

## 2019-06-05 DIAGNOSIS — Z794 Long term (current) use of insulin: Secondary | ICD-10-CM | POA: Diagnosis not present

## 2019-06-05 DIAGNOSIS — I129 Hypertensive chronic kidney disease with stage 1 through stage 4 chronic kidney disease, or unspecified chronic kidney disease: Secondary | ICD-10-CM | POA: Diagnosis not present

## 2019-07-02 ENCOUNTER — Encounter: Payer: Self-pay | Admitting: Podiatry

## 2019-07-02 ENCOUNTER — Ambulatory Visit (INDEPENDENT_AMBULATORY_CARE_PROVIDER_SITE_OTHER): Payer: Medicare Other | Admitting: Podiatry

## 2019-07-02 ENCOUNTER — Other Ambulatory Visit: Payer: Self-pay

## 2019-07-02 DIAGNOSIS — M79674 Pain in right toe(s): Secondary | ICD-10-CM | POA: Diagnosis not present

## 2019-07-02 DIAGNOSIS — E118 Type 2 diabetes mellitus with unspecified complications: Secondary | ICD-10-CM

## 2019-07-02 DIAGNOSIS — B351 Tinea unguium: Secondary | ICD-10-CM | POA: Diagnosis not present

## 2019-07-02 DIAGNOSIS — M79675 Pain in left toe(s): Secondary | ICD-10-CM

## 2019-07-02 NOTE — Progress Notes (Signed)
Patient ID: Bryan Knight, male   DOB: 06/21/1946, 73 y.o.   MRN: [id number]  Complaint:  Visit Type: Patient returns to my office for continued preventative foot care services. Complaint: Patient states" my nails have grown long and thick and become painful to walk and wear shoes" Patient has been diagnosed with DM with no complications. He presents for preventative foot care services. No changes to ROS  Podiatric Exam: Vascular: dorsalis pedis and posterior tibial pulses are palpable bilateral. Capillary return is immediate. Temperature gradient is WNL. Skin turgor WNL  Sensorium: Normal Semmes Weinstein monofilament test. Normal tactile sensation bilaterally. Nail Exam: Pt has thick disfigured discolored nails with subungual debris noted bilateral entire nail hallux through fifth toenails Ulcer Exam: There is no evidence of ulcer or pre-ulcerative changes or infection. Orthopedic Exam: Muscle tone and strength are WNL. No limitations in general ROM. No crepitus or effusions noted. Foot type and digits show no abnormalities. Bony prominences are unremarkable. Skin: No Porokeratosis. No infection or ulcers  Diagnosis:  Tinea unguium, Pain in right toe, pain in left toes  Treatment & Plan Procedures and Treatment: Consent by patient was obtained for treatment procedures. The patient understood the discussion of treatment and procedures well. All questions were answered thoroughly reviewed. Debridement of mycotic and hypertrophic toenails, 1 through 5 bilateral and clearing of subungual debris. No ulceration, no infection noted.  Return Visit-Office Procedure: Patient instructed to return to the office for a follow up visit 3 months for continued evaluation and treatment.   Jaliana Medellin DPM 

## 2019-07-08 ENCOUNTER — Telehealth (INDEPENDENT_AMBULATORY_CARE_PROVIDER_SITE_OTHER): Payer: Medicare Other | Admitting: Internal Medicine

## 2019-07-08 ENCOUNTER — Encounter: Payer: Self-pay | Admitting: Internal Medicine

## 2019-07-08 VITALS — BP 133/76 | HR 69

## 2019-07-08 DIAGNOSIS — R0602 Shortness of breath: Secondary | ICD-10-CM | POA: Diagnosis not present

## 2019-07-08 DIAGNOSIS — E118 Type 2 diabetes mellitus with unspecified complications: Secondary | ICD-10-CM | POA: Diagnosis not present

## 2019-07-08 DIAGNOSIS — N183 Chronic kidney disease, stage 3 unspecified: Secondary | ICD-10-CM

## 2019-07-08 DIAGNOSIS — I10 Essential (primary) hypertension: Secondary | ICD-10-CM

## 2019-07-08 DIAGNOSIS — E782 Mixed hyperlipidemia: Secondary | ICD-10-CM

## 2019-07-08 DIAGNOSIS — E1122 Type 2 diabetes mellitus with diabetic chronic kidney disease: Secondary | ICD-10-CM

## 2019-07-08 NOTE — Progress Notes (Signed)
Virtual Visit via Telephone Note   This visit type was conducted due to national recommendations for restrictions regarding the COVID-19 Pandemic (e.g. social distancing) in an effort to limit this patient's exposure and mitigate transmission in our community.  Due to his co-morbid illnesses, this patient is at least at moderate risk for complications without adequate follow up.  This format is felt to be most appropriate for this patient at this time.  The patient did not have access to video technology/had technical difficulties with video requiring transitioning to audio format only (telephone).  All issues noted in this document were discussed and addressed.  No physical exam could be performed with this format.  Please refer to the patient's chart for his  consent to telehealth for [facility].   Evaluation Performed:  Telephone visit  Date:  07/08/2019   ID:  Bryan Knight, Bryan Knight 01-17-1946, MRN [id number]  Patient Location:  [place] [address]  Provider location:   [address], [address] [place], [place] [address]  PCP:  Patient, No Pcp Per  Cardiologist:  No primary care provider on file. Electrophysiologist:  None   Chief Complaint:  No complaints  History of Present Illness:    Bryan Knight is a 73 y.o. male who presents via audio/video conferencing for a telehealth visit today.  Bryan Knight is seen today in follow-up.  Overall he says he is doing fairly well.  He walks 3 to 5 miles almost every day.  He denies any shortness of breath with this.  I initially seen him in 2016 for shortness of breath at which time he underwent stress testing and an echo.  The stress test was negative for ischemia and his echo showed grade 2 diastolic dysfunction with some mildly elevated pulmonary pressure.  Blood pressure was also elevated at the time and subsequently he has been on increasing doses of medication which seem to have helped his blood pressure.   Unfortunately is also had worsening renal function.  He now has stage IIIb chronic kidney disease is followed by Dr. Joelyn Oms at [facility].  His most recent lipids show LDL target less than 70 with mildly elevated triglycerides.  Hemoglobin A1c recently was 7.1.  The patient does not have symptoms concerning for COVID-19 infection (fever, chills, cough, or new SHORTNESS OF BREATH).    Prior CV studies:   The following studies were reviewed today:  Chart reviewed Labwork  PMHx:  Past Medical History:  Diagnosis Date   Cataract    Depression    Diabetes mellitus    Glaucoma    Hearing loss    Hyperlipidemia    Hypertension    Rosacea    Thyroid disease     Past Surgical History:  Procedure Laterality Date   EYE SURGERY     KNEE ARTHROSCOPY WITH MEDIAL MENISECTOMY Left 11/10/15   TONSILLECTOMY     over 50 yrs ago    FAMHx:  Family History  Problem Relation Age of Onset   Hypertension Mother    Liver cancer Mother 69   Hypertension Son     SOCHx:   reports that he quit smoking about 51 years ago. His smoking use included cigarettes. He has a 2.00 pack-year smoking history. He has never used smokeless tobacco. He reports that he does not drink alcohol or use drugs.  ALLERGIES:  Allergies  Allergen Reactions   Bupropion Other (See Comments)    Sluggish    Citalopram Other (See  Comments)    Ineffective Ineffective   Hydrocod Polst-Cpm Polst Er Other (See Comments)    Foggy headed Foggy headed   Wellbutrin [Bupropion Hcl] Other (See Comments)    Sluggish     MEDS:  Current Meds  Medication Sig   Alcohol Swabs (ALCOHOL PREP) PADS Use daily for insulin injection   amLODipine (NORVASC) 10 MG tablet Take 1 tablet (10 mg total) by mouth daily.   aspirin 81 MG tablet Take 81 mg by mouth daily.    atorvastatin (LIPITOR) 20 MG tablet TAKE 1 TABLET (20 MG TOTAL) BY MOUTH DAILY.   chlorthalidone (HYGROTON) 50 MG tablet Take 50 mg by mouth  daily.   DULoxetine (CYMBALTA) 30 MG capsule Take 1 capsule (30 mg total) by mouth daily.   hydrALAZINE (APRESOLINE) 100 MG tablet Take 100 mg by mouth 3 (three) times daily.    Insulin Glargine (BASAGLAR KWIKPEN) 100 UNIT/ML SOPN INJECT 0.1 MLS (10 UNITS TOTAL) INTO THE SKIN AT BEDTIME.   Insulin Pen Needle (EASY TOUCH PEN NEEDLES) 31G X 8 MM MISC 1 each by Does not apply route daily.   JANUVIA 100 MG tablet TAKE 1 TABLET DAILY   levothyroxine (SYNTHROID, LEVOTHROID) 75 MCG tablet Take 1 tablet (75 mcg total) by mouth daily.   lisinopril (PRINIVIL,ZESTRIL) 40 MG tablet Take 20 mg by mouth 2 (two) times daily.    metFORMIN (GLUCOPHAGE) 500 MG tablet TAKE 1 TABLET (500 MG TOTAL) BY MOUTH 2 (TWO) TIMES DAILY WITH A MEAL.   metoprolol succinate (TOPROL-XL) 100 MG 24 hr tablet Take 1 tablet (100 mg total) by mouth daily. Take with or immediately following a meal.   montelukast (SINGULAIR) 10 MG tablet Take 1 tablet (10 mg total) by mouth at bedtime.   ONETOUCH DELICA LANCETS 66A MISC USE TO TEST ONCE DAILY AS DIRECTED**MAX DAYS SUPPLY 90**   ONETOUCH VERIO test strip TEST BLOOD SUGAR ONCE DAILY.   VITAMIN D, CHOLECALCIFEROL, PO Take 2,000 Units by mouth daily.     ROS: Pertinent items noted in HPI and remainder of comprehensive ROS otherwise negative.  Labs/Other Tests and Data Reviewed:    Recent Labs: No results found for requested labs within last 8760 hours.   Recent Lipid Panel Lab Results  Component Value Date/Time   CHOL 125 01/01/2018 10:54 AM   TRIG 131 01/01/2018 10:54 AM   HDL 42 01/01/2018 10:54 AM   CHOLHDL 3.0 01/01/2018 10:54 AM   CHOLHDL 2.9 06/02/2016 04:25 PM   LDLCALC 57 01/01/2018 10:54 AM    Wt Readings from Last 3 Encounters:  04/24/18 168 lb (76.2 kg)  02/26/18 163 lb 6.4 oz (74.1 kg)  01/01/18 169 lb 9.6 oz (76.9 kg)     Exam:    Vital Signs:  BP 133/76    Pulse 69    Exam not performed due to telephone visit  ASSESSMENT & PLAN:     1. Essential hypertension 2. Mixed dyslipidemia 3. Type 2 diabetes-hemoglobin A1c 7.1 4. History of dyspnea with mild pulmonary hypertension and grade 2 diastolic dysfunction 5. CKD 3B  Overall Bryan Knight is doing reasonably well.  He exercises regularly denies any chest pain or worsening shortness of breath.  He has had some interval decline in renal function but is followed by nephrology.  His blood pressure is well controlled.  His diabetes is reasonably well controlled.  Cholesterol is at goal with very mildly elevated triglycerides.  No other changes were made to his meds today.  Follow-up with me  annually or sooner as necessary.  COVID-19 Education: The signs and symptoms of COVID-19 were discussed with the patient and how to seek care for testing (follow up with PCP or arrange E-visit).  The importance of social distancing was discussed today.  Patient Risk:   After full review of this patients clinical status, I feel that they are at least moderate risk at this time.  Time:   Today, I have spent 25 minutes with the patient with telehealth technology discussing dyslipidemia, hypertension, chronic kidney disease, dyspnea.     Medication Adjustments/Labs and Tests Ordered: Current medicines are reviewed at length with the patient today.  Concerns regarding medicines are outlined above.   Tests Ordered: No orders of the defined types were placed in this encounter.   Medication Changes: No orders of the defined types were placed in this encounter.   Disposition:  in 1 year(s)  Pixie Casino, MD, [facility], [place] Director of the Advanced Lipid Disorders &  Cardiovascular Risk Reduction Clinic Diplomate of the American Board of Clinical Lipidology Attending Cardiologist  Direct Dial: [phone]   Fax: [phone]  Website:  [email or web address]  Pixie Casino, MD  07/08/2019 8:16 AM    '

## 2019-07-08 NOTE — Patient Instructions (Signed)
Medication Instructions:  Your physician recommends that you continue on your current medications as directed. Please refer to the Current Medication list given to you today.  *If you need a refill on your cardiac medications before your next appointment, please call your pharmacy*   Follow-Up: At [facility], you and your health needs are our priority.  As part of our continuing mission to provide you with exceptional heart care, we have created designated Provider Care Teams.  These Care Teams include your primary Cardiologist (physician) and Advanced Practice Providers (APPs -  Physician Assistants and Nurse Practitioners) who all work together to provide you with the care you need, when you need it.  Your next appointment:   12 months  The format for your next appointment:   In Person  Provider:   You may see Dr. Debara Pickett or one of the following Advanced Practice Providers on your designated Care Team:    Almyra Deforest, PA-C  Fabian Sharp, [place] or   Roby Lofts, [place]   Other Instructions

## 2019-10-01 ENCOUNTER — Other Ambulatory Visit: Payer: Self-pay

## 2019-10-01 ENCOUNTER — Ambulatory Visit (INDEPENDENT_AMBULATORY_CARE_PROVIDER_SITE_OTHER): Payer: Medicare Other | Admitting: Podiatry

## 2019-10-01 ENCOUNTER — Encounter: Payer: Self-pay | Admitting: Podiatry

## 2019-10-01 DIAGNOSIS — B351 Tinea unguium: Secondary | ICD-10-CM | POA: Diagnosis not present

## 2019-10-01 DIAGNOSIS — M79674 Pain in right toe(s): Secondary | ICD-10-CM | POA: Diagnosis not present

## 2019-10-01 DIAGNOSIS — E118 Type 2 diabetes mellitus with unspecified complications: Secondary | ICD-10-CM

## 2019-10-01 DIAGNOSIS — M79675 Pain in left toe(s): Secondary | ICD-10-CM

## 2019-10-01 NOTE — Progress Notes (Signed)
Patient ID: Bryan Knight, male   DOB: 11/29/1945, 73 y.o.   MRN: [id number]  Complaint:  Visit Type: Patient returns to my office for continued preventative foot care services. Complaint: Patient states" my nails have grown long and thick and become painful to walk and wear shoes" Patient has been diagnosed with DM with no complications. He presents for preventative foot care services. No changes to ROS  Podiatric Exam: Vascular: dorsalis pedis and posterior tibial pulses are palpable bilateral. Capillary return is immediate. Temperature gradient is WNL. Skin turgor WNL  Sensorium: Normal Semmes Weinstein monofilament test. Normal tactile sensation bilaterally. Nail Exam: Pt has thick disfigured discolored nails with subungual debris noted bilateral entire nail hallux through fifth toenails Ulcer Exam: There is no evidence of ulcer or pre-ulcerative changes or infection. Orthopedic Exam: Muscle tone and strength are WNL. No limitations in general ROM. No crepitus or effusions noted. Foot type and digits show no abnormalities. Bony prominences are unremarkable. Skin: No Porokeratosis. No infection or ulcers  Diagnosis:  Tinea unguium, Pain in right toe, pain in left toes  Treatment & Plan Procedures and Treatment: Consent by patient was obtained for treatment procedures. The patient understood the discussion of treatment and procedures well. All questions were answered thoroughly reviewed. Debridement of mycotic and hypertrophic toenails, 1 through 5 bilateral and clearing of subungual debris. No ulceration, no infection noted.  Return Visit-Office Procedure: Patient instructed to return to the office for a follow up visit 3 months for continued evaluation and treatment.   Kysa Calais DPM 

## 2019-12-31 ENCOUNTER — Encounter: Payer: Self-pay | Admitting: Podiatry

## 2019-12-31 ENCOUNTER — Ambulatory Visit (INDEPENDENT_AMBULATORY_CARE_PROVIDER_SITE_OTHER): Payer: Medicare Other | Admitting: Podiatry

## 2019-12-31 ENCOUNTER — Other Ambulatory Visit: Payer: Self-pay

## 2019-12-31 VITALS — Temp 97.5°F

## 2019-12-31 DIAGNOSIS — E118 Type 2 diabetes mellitus with unspecified complications: Secondary | ICD-10-CM | POA: Diagnosis not present

## 2019-12-31 DIAGNOSIS — B351 Tinea unguium: Secondary | ICD-10-CM

## 2019-12-31 DIAGNOSIS — M79674 Pain in right toe(s): Secondary | ICD-10-CM

## 2019-12-31 DIAGNOSIS — M79675 Pain in left toe(s): Secondary | ICD-10-CM

## 2019-12-31 DIAGNOSIS — N183 Chronic kidney disease, stage 3 unspecified: Secondary | ICD-10-CM

## 2019-12-31 DIAGNOSIS — E1122 Type 2 diabetes mellitus with diabetic chronic kidney disease: Secondary | ICD-10-CM

## 2019-12-31 NOTE — Progress Notes (Signed)
This patient returns to my office for at risk foot care.  This patient requires this care by a professional since this patient will be at risk due to having chronic kidney disease and diabetes.  This patient is unable to cut nails himself since the patient cannot reach his nails.These nails are painful walking and wearing shoes.  This patient presents for at risk foot care today.  General Appearance  Alert, conversant and in no acute stress.  Vascular  Dorsalis pedis and posterior tibial  pulses are palpable  bilaterally.  Capillary return is within normal limits  bilaterally. Temperature is within normal limits  bilaterally.  Neurologic  Senn-Weinstein monofilament wire test within normal limits  bilaterally. Muscle power within normal limits bilaterally.  Nails Thick disfigured discolored nails with subungual debris  from hallux to fifth toes bilaterally. No evidence of bacterial infection or drainage bilaterally.  Orthopedic  No limitations of motion  feet .  No crepitus or effusions noted.  No bony pathology or digital deformities noted.  Skin  normotropic skin with no porokeratosis noted bilaterally.  No signs of infections or ulcers noted.     Onychomycosis  Pain in right toes  Pain in left toes  Consent was obtained for treatment procedures.   Mechanical debridement of nails 1-5  bilaterally performed with a nail nipper.  Filed with dremel without incident.    Return office visit      3 months                Told patient to return for periodic foot care and evaluation due to potential at risk complications.   Jayani Rozman DPM  

## 2020-03-31 ENCOUNTER — Encounter: Payer: Self-pay | Admitting: Podiatry

## 2020-03-31 ENCOUNTER — Other Ambulatory Visit: Payer: Self-pay

## 2020-03-31 ENCOUNTER — Ambulatory Visit (INDEPENDENT_AMBULATORY_CARE_PROVIDER_SITE_OTHER): Payer: Medicare Other | Admitting: Podiatry

## 2020-03-31 DIAGNOSIS — B351 Tinea unguium: Secondary | ICD-10-CM

## 2020-03-31 DIAGNOSIS — E118 Type 2 diabetes mellitus with unspecified complications: Secondary | ICD-10-CM | POA: Diagnosis not present

## 2020-03-31 DIAGNOSIS — M79675 Pain in left toe(s): Secondary | ICD-10-CM | POA: Diagnosis not present

## 2020-03-31 DIAGNOSIS — M79674 Pain in right toe(s): Secondary | ICD-10-CM

## 2020-03-31 DIAGNOSIS — N183 Chronic kidney disease, stage 3 unspecified: Secondary | ICD-10-CM

## 2020-03-31 DIAGNOSIS — E1122 Type 2 diabetes mellitus with diabetic chronic kidney disease: Secondary | ICD-10-CM

## 2020-03-31 NOTE — Progress Notes (Signed)
This patient returns to my office for at risk foot care.  This patient requires this care by a professional since this patient will be at risk due to having chronic kidney disease and diabetes.  This patient is unable to cut nails himself since the patient cannot reach his nails.These nails are painful walking and wearing shoes.  This patient presents for at risk foot care today.  General Appearance  Alert, conversant and in no acute stress.  Vascular  Dorsalis pedis and posterior tibial  pulses are palpable  bilaterally.  Capillary return is within normal limits  bilaterally. Temperature is within normal limits  bilaterally.  Neurologic  Senn-Weinstein monofilament wire test within normal limits  bilaterally. Muscle power within normal limits bilaterally.  Nails Thick disfigured discolored nails with subungual debris  from hallux to fifth toes bilaterally. No evidence of bacterial infection or drainage bilaterally.  Orthopedic  No limitations of motion  feet .  No crepitus or effusions noted.  No bony pathology or digital deformities noted.  Skin  normotropic skin with no porokeratosis noted bilaterally.  No signs of infections or ulcers noted.     Onychomycosis  Pain in right toes  Pain in left toes  Consent was obtained for treatment procedures.   Mechanical debridement of nails 1-5  bilaterally performed with a nail nipper.  Filed with dremel without incident.    Return office visit      3 months                Told patient to return for periodic foot care and evaluation due to potential at risk complications.   Jacora Hopkins DPM  

## 2020-07-09 ENCOUNTER — Ambulatory Visit (INDEPENDENT_AMBULATORY_CARE_PROVIDER_SITE_OTHER): Payer: Medicare Other | Admitting: Podiatry

## 2020-07-09 ENCOUNTER — Other Ambulatory Visit: Payer: Self-pay

## 2020-07-09 ENCOUNTER — Encounter: Payer: Self-pay | Admitting: Podiatry

## 2020-07-09 DIAGNOSIS — M79674 Pain in right toe(s): Secondary | ICD-10-CM

## 2020-07-09 DIAGNOSIS — B351 Tinea unguium: Secondary | ICD-10-CM

## 2020-07-09 DIAGNOSIS — E118 Type 2 diabetes mellitus with unspecified complications: Secondary | ICD-10-CM

## 2020-07-09 DIAGNOSIS — N183 Chronic kidney disease, stage 3 unspecified: Secondary | ICD-10-CM

## 2020-07-09 DIAGNOSIS — M79675 Pain in left toe(s): Secondary | ICD-10-CM

## 2020-07-09 DIAGNOSIS — E1122 Type 2 diabetes mellitus with diabetic chronic kidney disease: Secondary | ICD-10-CM | POA: Diagnosis not present

## 2020-07-09 NOTE — Progress Notes (Signed)
This patient returns to my office for at risk foot care.  This patient requires this care by a professional since this patient will be at risk due to having chronic kidney disease and diabetes.  This patient is unable to cut nails himself since the patient cannot reach his nails.These nails are painful walking and wearing shoes.  This patient presents for at risk foot care today.  General Appearance  Alert, conversant and in no acute stress.  Vascular  Dorsalis pedis and posterior tibial  pulses are palpable  bilaterally.  Capillary return is within normal limits  bilaterally. Temperature is within normal limits  bilaterally.  Neurologic  Senn-Weinstein monofilament wire test within normal limits  bilaterally. Muscle power within normal limits bilaterally.  Nails Thick disfigured discolored nails with subungual debris  from hallux to fifth toes bilaterally. No evidence of bacterial infection or drainage bilaterally.  Orthopedic  No limitations of motion  feet .  No crepitus or effusions noted.  No bony pathology or digital deformities noted.  Skin  normotropic skin with no porokeratosis noted bilaterally.  No signs of infections or ulcers noted.     Onychomycosis  Pain in right toes  Pain in left toes  Consent was obtained for treatment procedures.   Mechanical debridement of nails 1-5  bilaterally performed with a nail nipper.  Filed with dremel without incident.    Return office visit      3 months                Told patient to return for periodic foot care and evaluation due to potential at risk complications.   Ramsie Ostrander DPM  

## 2020-07-12 ENCOUNTER — Telehealth (INDEPENDENT_AMBULATORY_CARE_PROVIDER_SITE_OTHER): Payer: Medicare Other | Admitting: Physician Assistant

## 2020-07-12 ENCOUNTER — Telehealth: Payer: Self-pay

## 2020-07-12 ENCOUNTER — Encounter: Payer: Self-pay | Admitting: Physician Assistant

## 2020-07-12 VITALS — BP 125/73 | Ht 70.0 in | Wt 166.0 lb

## 2020-07-12 DIAGNOSIS — E119 Type 2 diabetes mellitus without complications: Secondary | ICD-10-CM | POA: Diagnosis not present

## 2020-07-12 DIAGNOSIS — E039 Hypothyroidism, unspecified: Secondary | ICD-10-CM

## 2020-07-12 DIAGNOSIS — I10 Essential (primary) hypertension: Secondary | ICD-10-CM | POA: Diagnosis not present

## 2020-07-12 DIAGNOSIS — N184 Chronic kidney disease, stage 4 (severe): Secondary | ICD-10-CM

## 2020-07-12 DIAGNOSIS — E782 Mixed hyperlipidemia: Secondary | ICD-10-CM

## 2020-07-12 NOTE — Patient Instructions (Signed)
Medication Instructions:  Your physician recommends that you continue on your current medications as directed. Please refer to the Current Medication list given to you today.  *If you need a refill on your cardiac medications before your next appointment, please call your pharmacy*  Lab Work: NONE ordered at this time of appointment   If you have labs (blood work) drawn today and your tests are completely normal, you will receive your results only by: . MyChart Message (if you have MyChart) OR . A paper copy in the mail If you have any lab test that is abnormal or we need to change your treatment, we will call you to review the results.  Testing/Procedures: NONE ordered at this time of appointment   Follow-Up: At [facility], you and your health needs are our priority.  As part of our continuing mission to provide you with exceptional heart care, we have created designated Provider Care Teams.  These Care Teams include your primary Cardiologist (physician) and Advanced Practice Providers (APPs -  Physician Assistants and Nurse Practitioners) who all work together to provide you with the care you need, when you need it.  Your next appointment:   1 year(s)  The format for your next appointment:   In Person  Provider:   K. Chad Hilty, MD  Other Instructions   

## 2020-07-12 NOTE — Progress Notes (Signed)
Virtual Visit via Telephone Note   This visit type was conducted due to national recommendations for restrictions regarding the COVID-19 Pandemic (e.g. social distancing) in an effort to limit this patient's exposure and mitigate transmission in our community.  Due to his co-morbid illnesses, this patient is at least at moderate risk for complications without adequate follow up.  This format is felt to be most appropriate for this patient at this time.  The patient did not have access to video technology/had technical difficulties with video requiring transitioning to audio format only (telephone).  All issues noted in this document were discussed and addressed.  No physical exam could be performed with this format.  Please refer to the patient's chart for his  consent to telehealth for [facility].    Date:  07/12/2020   ID:  Bryan Knight, DOB 19-Nov-1945, MRN [id number] The patient was identified using 2 identifiers.  Patient Location: Home Provider Location: Office/Clinic  PCP:  Harrison Mons, PA  Cardiologist:  Pixie Casino, MD  Electrophysiologist:  None   Evaluation Performed:  Follow-Up Visit  Chief Complaint:  Follow up  History of Present Illness:    Bryan Knight is a 74 y.o. male with past medical history of HTN, HLD, DM 2, CKD stage IV and hypothyroidism. He was referred to cardiology service mainly for history of uncontrolled high blood pressure.  Myoview obtained in 2016 was negative.  Echocardiogram obtained at that time showed grade 2 DD, mild elevation of pulmonary pressure.  With medication adjustment, his blood pressure has been controlled on combination of amlodipine, chlorthalidone, hydralazine, lisinopril, spironolactone and metoprolol succinate.  Renal function has been followed by Dr. Joelyn Oms of [facility].  Patient presents today for follow-up.  He denies any recent chest pain or shortness of breath.  He is functionally independent and has  been feeling well.  He has upcoming visit with Dr. Joelyn Oms tomorrow and there with repeat lab work.  Recently, Dr. Joelyn Oms has added spironolactone to his medication.  Based on recent lab work, his renal function continued to deteriorate.  Three of 6 blood pressure medications, chlorthalidone, lisinopril and spironolactone can potentially affect his renal function and electrolyte.  I will defer to Dr. Joelyn Oms, his nephrologist, to review if any of the above medication need to cut back.  Otherwise he can follow-up in 1 year.  The patient does not have symptoms concerning for COVID-19 infection (fever, chills, cough, or new shortness of breath).    Past Medical History:  Diagnosis Date  . Cataract   . Depression   . Diabetes mellitus   . Glaucoma   . Hearing loss   . Hyperlipidemia   . Hypertension   . Rosacea   . Thyroid disease    Past Surgical History:  Procedure Laterality Date  . EYE SURGERY    . KNEE ARTHROSCOPY WITH MEDIAL MENISECTOMY Left 11/10/15  . TONSILLECTOMY     over 50 yrs ago     Current Meds  Medication Sig  . amLODipine (NORVASC) 10 MG tablet Take 1 tablet (10 mg total) by mouth daily.  Marland Kitchen aspirin 81 MG tablet Take 81 mg by mouth daily.   Marland Kitchen atorvastatin (LIPITOR) 20 MG tablet TAKE 1 TABLET (20 MG TOTAL) BY MOUTH DAILY.  . chlorthalidone (HYGROTON) 25 MG tablet Take 25 mg by mouth.   . DULoxetine (CYMBALTA) 30 MG capsule Take 1 tablet by mouth daily.  Marland Kitchen FARXIGA 10 MG TABS tablet Take 10 mg by  mouth daily.  Marland Kitchen glucose blood test strip one strip (1 each dose) by Other route 2 (two) times daily. Pharmacy, please dispense this brand of blood glucose test strips: OneTouch Verio  . hydrALAZINE (APRESOLINE) 100 MG tablet Take 100 mg by mouth 3 (three) times daily.   . Insulin Glargine (BASAGLAR KWIKPEN) 100 UNIT/ML SOPN 14 Units.   . Insulin Pen Needle (EASY TOUCH PEN NEEDLES) 31G X 8 MM MISC 1 each by Does not apply route daily.  Marland Kitchen levothyroxine (SYNTHROID) 75 MCG tablet  Take 1 tablet by mouth daily.  Marland Kitchen lisinopril (ZESTRIL) 20 MG tablet Take 20 mg by mouth 2 (two) times daily.  . metoprolol succinate (TOPROL-XL) 100 MG 24 hr tablet Take 1 tablet (100 mg total) by mouth daily. Take with or immediately following a meal.  . montelukast (SINGULAIR) 10 MG tablet Take 1 tablet (10 mg total) by mouth at bedtime.  Glory Rosebush DELICA LANCETS 35T MISC USE TO TEST ONCE DAILY AS DIRECTED**MAX DAYS SUPPLY 90**  . sitaGLIPtin (JANUVIA) 100 MG tablet Take 1 tablet by mouth every morning.  Marland Kitchen spironolactone (ALDACTONE) 25 MG tablet Take 25 mg by mouth daily.  Marland Kitchen VITAMIN D, CHOLECALCIFEROL, PO Take 2,000 Units by mouth daily.     Allergies:   Bupropion, Citalopram, Hydrocod polst-cpm polst er, and Wellbutrin [bupropion hcl]   Social History   Tobacco Use  . Smoking status: Former Smoker    Packs/day: 0.50    Years: 4.00    Pack years: 2.00    Types: Cigarettes    Quit date: 10/18/1967    Years since quitting: 52.7  . Smokeless tobacco: Never Used  Vaping Use  . Vaping Use: Never used  Substance Use Topics  . Alcohol use: No    Alcohol/week: 0.0 standard drinks  . Drug use: No     Family Hx: The patient's family history includes Hypertension in his mother and son; Liver cancer (age of onset: 67) in his mother.  ROS:   Please see the history of present illness.     All other systems reviewed and are negative.   Prior CV studies:   The following studies were reviewed today:  Myoview 08/25/2015  Nuclear stress EF: 57%.  The left ventricular ejection fraction is normal (55-65%).  There was no ST segment deviation noted during stress.  The study is normal.  This is a low risk study.   1. Low risk study 2. Nl perfusion and EF   Labs/Other Tests and Data Reviewed:    EKG:  An ECG dated 04/24/2018 was personally reviewed today and demonstrated:  Normal sinus rhythm without significant ST-T wave changes.  Recent Labs: No results found for requested labs  within last 8760 hours.   Recent Lipid Panel Lab Results  Component Value Date/Time   CHOL 125 01/01/2018 10:54 AM   TRIG 131 01/01/2018 10:54 AM   HDL 42 01/01/2018 10:54 AM   CHOLHDL 3.0 01/01/2018 10:54 AM   CHOLHDL 2.9 06/02/2016 04:25 PM   LDLCALC 57 01/01/2018 10:54 AM    Wt Readings from Last 3 Encounters:  07/12/20 166 lb (75.3 kg)  04/24/18 168 lb (76.2 kg)  02/26/18 163 lb 6.4 oz (74.1 kg)     Risk Assessment/Calculations:      Objective:    Vital Signs:  BP 125/73   Ht 5\' 10"  (1.778 m)   Wt 166 lb (75.3 kg)   BMI 23.82 kg/m    VITAL SIGNS:  reviewed  ASSESSMENT &  PLAN:    1. Hypertension: Blood pressure well controlled on the current therapy.  He had uncontrolled blood pressure for many years, currently blood pressure is better controlled after taking 6 blood pressure medications.  I am less worried about his blood pressure but more worried about his deteriorating renal function.  He is being followed closely by Dr. Joelyn Oms.  Several his medication including chlorthalidone, lisinopril and spironolactone can all affect his renal function.  He will see Dr. Joelyn Oms tomorrow, will defer to Dr. Joelyn Oms to see if any of his medications need to cut back.  2. Hyperlipidemia: On Lipitor  3. DM2: On insulin, managed by primary care provider  4. CKD stage IV: Followed by Dr. Joelyn Oms of [facility]  5. Hypothyroidism: On levothyroxine  COVID-19 Education: The signs and symptoms of COVID-19 were discussed with the patient and how to seek care for testing (follow up with PCP or arrange E-visit).  The importance of social distancing was discussed today.  Time:   Today, I have spent 11 minutes with the patient with telehealth technology discussing the above problems.     Medication Adjustments/Labs and Tests Ordered: Current medicines are reviewed at length with the patient today.  Concerns regarding medicines are outlined above.   Tests Ordered: No  orders of the defined types were placed in this encounter.   Medication Changes: No orders of the defined types were placed in this encounter.   Follow Up:  In Person in 1 year(s)  Signed, Almyra Deforest, [place]  07/12/2020 10:45 AM    [place] [facility]

## 2020-07-12 NOTE — Telephone Encounter (Signed)
  Patient Consent for Virtual Visit         STEFAN MARKARIAN has provided verbal consent on 07/12/2020 for a virtual visit (video or telephone).   CONSENT FOR VIRTUAL VISIT FOR:  Bryan Knight  By participating in this virtual visit I agree to the following:  I hereby voluntarily request, consent and authorize [facility] and its employed or contracted physicians, physician assistants, nurse practitioners or other licensed health care professionals (the Practitioner), to provide me with telemedicine health care services (the "Services") as deemed necessary by the treating Practitioner. I acknowledge and consent to receive the Services by the Practitioner via telemedicine. I understand that the telemedicine visit will involve communicating with the Practitioner through live audiovisual communication technology and the disclosure of certain medical information by electronic transmission. I acknowledge that I have been given the opportunity to request an in-person assessment or other available alternative prior to the telemedicine visit and am voluntarily participating in the telemedicine visit.  I understand that I have the right to withhold or withdraw my consent to the use of telemedicine in the course of my care at any time, without affecting my right to future care or treatment, and that the Practitioner or I may terminate the telemedicine visit at any time. I understand that I have the right to inspect all information obtained and/or recorded in the course of the telemedicine visit and may receive copies of available information for a reasonable fee.  I understand that some of the potential risks of receiving the Services via telemedicine include:  Marland Kitchen Delay or interruption in medical evaluation due to technological equipment failure or disruption; . Information transmitted may not be sufficient (e.g. poor resolution of images) to allow for appropriate medical decision making by the Practitioner;  and/or  . In rare instances, security protocols could fail, causing a breach of personal health information.  Furthermore, I acknowledge that it is my responsibility to provide information about my medical history, conditions and care that is complete and accurate to the best of my ability. I acknowledge that Practitioner's advice, recommendations, and/or decision may be based on factors not within their control, such as incomplete or inaccurate data provided by me or distortions of diagnostic images or specimens that may result from electronic transmissions. I understand that the practice of medicine is not an exact science and that Practitioner makes no warranties or guarantees regarding treatment outcomes. I acknowledge that a copy of this consent can be made available to me via my patient portal ([facility]), or I can request a printed copy by calling the office of [facility] [place].    I understand that my insurance will be billed for this visit.   I have read or had this consent read to me. . I understand the contents of this consent, which adequately explains the benefits and risks of the Services being provided via telemedicine.  . I have been provided ample opportunity to ask questions regarding this consent and the Services and have had my questions answered to my satisfaction. . I give my informed consent for the services to be provided through the use of telemedicine in my medical care

## 2020-07-12 NOTE — Telephone Encounter (Signed)
Called patient to discuss AVS instructions gave Bryan Knight's recommendations and patient voiced understanding. AVS summary mailed to patient.    

## 2020-10-15 ENCOUNTER — Ambulatory Visit: Payer: Medicare Other | Admitting: Podiatry

## 2020-11-03 ENCOUNTER — Other Ambulatory Visit: Payer: Self-pay

## 2020-11-03 ENCOUNTER — Ambulatory Visit (INDEPENDENT_AMBULATORY_CARE_PROVIDER_SITE_OTHER): Payer: Medicare Other | Admitting: Podiatry

## 2020-11-03 ENCOUNTER — Encounter: Payer: Self-pay | Admitting: Podiatry

## 2020-11-03 DIAGNOSIS — E118 Type 2 diabetes mellitus with unspecified complications: Secondary | ICD-10-CM | POA: Diagnosis not present

## 2020-11-03 DIAGNOSIS — N183 Chronic kidney disease, stage 3 unspecified: Secondary | ICD-10-CM

## 2020-11-03 DIAGNOSIS — E1122 Type 2 diabetes mellitus with diabetic chronic kidney disease: Secondary | ICD-10-CM | POA: Diagnosis not present

## 2020-11-03 DIAGNOSIS — B351 Tinea unguium: Secondary | ICD-10-CM

## 2020-11-03 DIAGNOSIS — M79675 Pain in left toe(s): Secondary | ICD-10-CM

## 2020-11-03 DIAGNOSIS — M79674 Pain in right toe(s): Secondary | ICD-10-CM

## 2020-11-03 NOTE — Progress Notes (Signed)
This patient returns to my office for at risk foot care.  This patient requires this care by a professional since this patient will be at risk due to having chronic kidney disease and diabetes.  This patient is unable to cut nails himself since the patient cannot reach his nails.These nails are painful walking and wearing shoes.  This patient presents for at risk foot care today.  General Appearance  Alert, conversant and in no acute stress.  Vascular  Dorsalis pedis and posterior tibial  pulses are palpable  bilaterally.  Capillary return is within normal limits  bilaterally. Temperature is within normal limits  bilaterally.  Neurologic  Senn-Weinstein monofilament wire test within normal limits  bilaterally. Muscle power within normal limits bilaterally.  Nails Thick disfigured discolored nails with subungual debris  from hallux to fifth toes bilaterally. No evidence of bacterial infection or drainage bilaterally.  Orthopedic  No limitations of motion  feet .  No crepitus or effusions noted.  No bony pathology or digital deformities noted.  Skin  normotropic skin with no porokeratosis noted bilaterally.  No signs of infections or ulcers noted.     Onychomycosis  Pain in right toes  Pain in left toes  Consent was obtained for treatment procedures.   Mechanical debridement of nails 1-5  bilaterally performed with a nail nipper.  Filed with dremel without incident.    Return office visit      3 months                Told patient to return for periodic foot care and evaluation due to potential at risk complications.   Jazma Pickel DPM  

## 2021-02-02 ENCOUNTER — Other Ambulatory Visit: Payer: Self-pay

## 2021-02-02 ENCOUNTER — Encounter: Payer: Self-pay | Admitting: Podiatry

## 2021-02-02 ENCOUNTER — Ambulatory Visit (INDEPENDENT_AMBULATORY_CARE_PROVIDER_SITE_OTHER): Payer: Medicare Other | Admitting: Podiatry

## 2021-02-02 DIAGNOSIS — E1122 Type 2 diabetes mellitus with diabetic chronic kidney disease: Secondary | ICD-10-CM

## 2021-02-02 DIAGNOSIS — E118 Type 2 diabetes mellitus with unspecified complications: Secondary | ICD-10-CM

## 2021-02-02 DIAGNOSIS — N183 Chronic kidney disease, stage 3 unspecified: Secondary | ICD-10-CM

## 2021-02-02 DIAGNOSIS — M79675 Pain in left toe(s): Secondary | ICD-10-CM

## 2021-02-02 DIAGNOSIS — B351 Tinea unguium: Secondary | ICD-10-CM

## 2021-02-02 DIAGNOSIS — M79674 Pain in right toe(s): Secondary | ICD-10-CM

## 2021-02-02 NOTE — Progress Notes (Signed)
This patient returns to my office for at risk foot care.  This patient requires this care by a professional since this patient will be at risk due to having chronic kidney disease and diabetes.  This patient is unable to cut nails himself since the patient cannot reach his nails.These nails are painful walking and wearing shoes.  This patient presents for at risk foot care today.  General Appearance  Alert, conversant and in no acute stress.  Vascular  Dorsalis pedis and posterior tibial  pulses are palpable  bilaterally.  Capillary return is within normal limits  bilaterally. Temperature is within normal limits  bilaterally.  Neurologic  Senn-Weinstein monofilament wire test within normal limits  bilaterally. Muscle power within normal limits bilaterally.  Nails Thick disfigured discolored nails with subungual debris  from hallux to fifth toes bilaterally. No evidence of bacterial infection or drainage bilaterally.  Orthopedic  No limitations of motion  feet .  No crepitus or effusions noted.  No bony pathology or digital deformities noted.  Skin  normotropic skin with no porokeratosis noted bilaterally.  No signs of infections or ulcers noted.     Onychomycosis  Pain in right toes  Pain in left toes  Consent was obtained for treatment procedures.   Mechanical debridement of nails 1-5  bilaterally performed with a nail nipper.  Filed with dremel without incident.    Return office visit      3 months                Told patient to return for periodic foot care and evaluation due to potential at risk complications.   Lyrik Dockstader DPM  

## 2021-03-17 ENCOUNTER — Encounter: Payer: Self-pay | Admitting: Podiatry

## 2021-03-17 ENCOUNTER — Telehealth: Payer: Self-pay | Admitting: Podiatry

## 2021-03-17 NOTE — Telephone Encounter (Signed)
Called patient lvm to reschedule 8/22 appt also sent letter

## 2021-05-09 ENCOUNTER — Ambulatory Visit: Payer: Medicare Other | Admitting: Podiatry

## 2021-05-17 ENCOUNTER — Ambulatory Visit (INDEPENDENT_AMBULATORY_CARE_PROVIDER_SITE_OTHER): Payer: Medicare Other | Admitting: Podiatry

## 2021-05-17 ENCOUNTER — Other Ambulatory Visit: Payer: Self-pay

## 2021-05-17 ENCOUNTER — Encounter: Payer: Self-pay | Admitting: Podiatry

## 2021-05-17 DIAGNOSIS — E118 Type 2 diabetes mellitus with unspecified complications: Secondary | ICD-10-CM | POA: Diagnosis not present

## 2021-05-17 DIAGNOSIS — N183 Chronic kidney disease, stage 3 unspecified: Secondary | ICD-10-CM

## 2021-05-17 DIAGNOSIS — M79674 Pain in right toe(s): Secondary | ICD-10-CM

## 2021-05-17 DIAGNOSIS — E1122 Type 2 diabetes mellitus with diabetic chronic kidney disease: Secondary | ICD-10-CM | POA: Diagnosis not present

## 2021-05-17 DIAGNOSIS — B351 Tinea unguium: Secondary | ICD-10-CM

## 2021-05-17 DIAGNOSIS — M79675 Pain in left toe(s): Secondary | ICD-10-CM

## 2021-05-17 NOTE — Progress Notes (Signed)
This patient returns to my office for at risk foot care.  This patient requires this care by a professional since this patient will be at risk due to having chronic kidney disease and diabetes.  This patient is unable to cut nails himself since the patient cannot reach his nails.These nails are painful walking and wearing shoes.  This patient presents for at risk foot care today.  General Appearance  Alert, conversant and in no acute stress.  Vascular  Dorsalis pedis and posterior tibial  pulses are palpable  bilaterally.  Capillary return is within normal limits  bilaterally. Temperature is within normal limits  bilaterally.  Neurologic  Senn-Weinstein monofilament wire test within normal limits  bilaterally. Muscle power within normal limits bilaterally.  Nails Thick disfigured discolored nails with subungual debris  from hallux to fifth toes bilaterally. No evidence of bacterial infection or drainage bilaterally.  Orthopedic  No limitations of motion  feet .  No crepitus or effusions noted.  No bony pathology or digital deformities noted.  Skin  normotropic skin with no porokeratosis noted bilaterally.  No signs of infections or ulcers noted.     Onychomycosis  Pain in right toes  Pain in left toes  Consent was obtained for treatment procedures.   Mechanical debridement of nails 1-5  bilaterally performed with a nail nipper.  Filed with dremel without incident.    Return office visit      3 months                Told patient to return for periodic foot care and evaluation due to potential at risk complications.   Emika Tiano DPM  

## 2021-06-27 DIAGNOSIS — M47816 Spondylosis without myelopathy or radiculopathy, lumbar region: Secondary | ICD-10-CM | POA: Insufficient documentation

## 2021-06-27 DIAGNOSIS — M5136 Other intervertebral disc degeneration, lumbar region: Secondary | ICD-10-CM | POA: Insufficient documentation

## 2021-08-17 ENCOUNTER — Ambulatory Visit (INDEPENDENT_AMBULATORY_CARE_PROVIDER_SITE_OTHER): Payer: Medicare Other | Admitting: Podiatry

## 2021-08-17 ENCOUNTER — Encounter: Payer: Self-pay | Admitting: Podiatry

## 2021-08-17 ENCOUNTER — Other Ambulatory Visit: Payer: Self-pay

## 2021-08-17 DIAGNOSIS — E1122 Type 2 diabetes mellitus with diabetic chronic kidney disease: Secondary | ICD-10-CM

## 2021-08-17 DIAGNOSIS — E118 Type 2 diabetes mellitus with unspecified complications: Secondary | ICD-10-CM

## 2021-08-17 DIAGNOSIS — B351 Tinea unguium: Secondary | ICD-10-CM | POA: Diagnosis not present

## 2021-08-17 DIAGNOSIS — M79674 Pain in right toe(s): Secondary | ICD-10-CM

## 2021-08-17 DIAGNOSIS — M79675 Pain in left toe(s): Secondary | ICD-10-CM

## 2021-08-17 DIAGNOSIS — N183 Chronic kidney disease, stage 3 unspecified: Secondary | ICD-10-CM

## 2021-08-17 NOTE — Progress Notes (Signed)
This patient returns to my office for at risk foot care.  This patient requires this care by a professional since this patient will be at risk due to having chronic kidney disease and diabetes.  This patient is unable to cut nails himself since the patient cannot reach his nails.These nails are painful walking and wearing shoes.  This patient presents for at risk foot care today.  General Appearance  Alert, conversant and in no acute stress.  Vascular  Dorsalis pedis and posterior tibial  pulses are palpable  bilaterally.  Capillary return is within normal limits  bilaterally. Temperature is within normal limits  bilaterally.  Neurologic  Senn-Weinstein monofilament wire test within normal limits  bilaterally. Muscle power within normal limits bilaterally.  Nails Thick disfigured discolored nails with subungual debris  from hallux to fifth toes bilaterally. No evidence of bacterial infection or drainage bilaterally.  Orthopedic  No limitations of motion  feet .  No crepitus or effusions noted.  No bony pathology or digital deformities noted.  Skin  normotropic skin with no porokeratosis noted bilaterally.  No signs of infections or ulcers noted.     Onychomycosis  Pain in right toes  Pain in left toes  Consent was obtained for treatment procedures.   Mechanical debridement of nails 1-5  bilaterally performed with a nail nipper.  Filed with dremel without incident.    Return office visit      3 months                Told patient to return for periodic foot care and evaluation due to potential at risk complications.   Makylee Sanborn DPM  

## 2021-11-16 ENCOUNTER — Ambulatory Visit (INDEPENDENT_AMBULATORY_CARE_PROVIDER_SITE_OTHER): Payer: Medicare Other | Admitting: Podiatry

## 2021-11-16 ENCOUNTER — Encounter: Payer: Self-pay | Admitting: Podiatry

## 2021-11-16 ENCOUNTER — Other Ambulatory Visit: Payer: Self-pay

## 2021-11-16 DIAGNOSIS — E1122 Type 2 diabetes mellitus with diabetic chronic kidney disease: Secondary | ICD-10-CM

## 2021-11-16 DIAGNOSIS — B351 Tinea unguium: Secondary | ICD-10-CM

## 2021-11-16 DIAGNOSIS — E118 Type 2 diabetes mellitus with unspecified complications: Secondary | ICD-10-CM

## 2021-11-16 DIAGNOSIS — M79674 Pain in right toe(s): Secondary | ICD-10-CM

## 2021-11-16 DIAGNOSIS — M79675 Pain in left toe(s): Secondary | ICD-10-CM

## 2021-11-16 DIAGNOSIS — N183 Chronic kidney disease, stage 3 unspecified: Secondary | ICD-10-CM

## 2021-11-16 NOTE — Progress Notes (Signed)
This patient returns to my office for at risk foot care.  This patient requires this care by a professional since this patient will be at risk due to having chronic kidney disease and diabetes.  This patient is unable to cut nails himself since the patient cannot reach his nails.These nails are painful walking and wearing shoes.  This patient presents for at risk foot care today.  General Appearance  Alert, conversant and in no acute stress.  Vascular  Dorsalis pedis and posterior tibial  pulses are palpable  bilaterally.  Capillary return is within normal limits  bilaterally. Temperature is within normal limits  bilaterally.  Neurologic  Senn-Weinstein monofilament wire test within normal limits  bilaterally. Muscle power within normal limits bilaterally.  Nails Thick disfigured discolored nails with subungual debris  from hallux to fifth toes bilaterally. No evidence of bacterial infection or drainage bilaterally.  Orthopedic  No limitations of motion  feet .  No crepitus or effusions noted.  No bony pathology or digital deformities noted.  Skin  normotropic skin with no porokeratosis noted bilaterally.  No signs of infections or ulcers noted.     Onychomycosis  Pain in right toes  Pain in left toes  Consent was obtained for treatment procedures.   Mechanical debridement of nails 1-5  bilaterally performed with a nail nipper.  Filed with dremel without incident.    Return office visit      3 months                Told patient to return for periodic foot care and evaluation due to potential at risk complications.   Yovana Scogin DPM  

## 2022-02-17 ENCOUNTER — Encounter: Payer: Self-pay | Admitting: Podiatry

## 2022-02-17 ENCOUNTER — Ambulatory Visit (INDEPENDENT_AMBULATORY_CARE_PROVIDER_SITE_OTHER): Payer: Medicare Other | Admitting: Podiatry

## 2022-02-17 DIAGNOSIS — E118 Type 2 diabetes mellitus with unspecified complications: Secondary | ICD-10-CM

## 2022-02-17 DIAGNOSIS — N183 Chronic kidney disease, stage 3 unspecified: Secondary | ICD-10-CM | POA: Diagnosis not present

## 2022-02-17 DIAGNOSIS — M79674 Pain in right toe(s): Secondary | ICD-10-CM

## 2022-02-17 DIAGNOSIS — E1122 Type 2 diabetes mellitus with diabetic chronic kidney disease: Secondary | ICD-10-CM | POA: Diagnosis not present

## 2022-02-17 DIAGNOSIS — B351 Tinea unguium: Secondary | ICD-10-CM | POA: Diagnosis not present

## 2022-02-17 DIAGNOSIS — M79675 Pain in left toe(s): Secondary | ICD-10-CM | POA: Diagnosis not present

## 2022-02-17 NOTE — Progress Notes (Signed)
This patient returns to my office for at risk foot care.  This patient requires this care by a professional since this patient will be at risk due to having chronic kidney disease and diabetes.  This patient is unable to cut nails himself since the patient cannot reach his nails.These nails are painful walking and wearing shoes.  This patient presents for at risk foot care today.  General Appearance  Alert, conversant and in no acute stress.  Vascular  Dorsalis pedis and posterior tibial  pulses are palpable  bilaterally.  Capillary return is within normal limits  bilaterally. Temperature is within normal limits  bilaterally.  Neurologic  Senn-Weinstein monofilament wire test within normal limits  bilaterally. Muscle power within normal limits bilaterally.  Nails Thick disfigured discolored nails with subungual debris  from hallux to fifth toes bilaterally. No evidence of bacterial infection or drainage bilaterally.  Orthopedic  No limitations of motion  feet .  No crepitus or effusions noted.  No bony pathology or digital deformities noted.  Skin  normotropic skin with no porokeratosis noted bilaterally.  No signs of infections or ulcers noted.     Onychomycosis  Pain in right toes  Pain in left toes  Consent was obtained for treatment procedures.   Mechanical debridement of nails 1-5  bilaterally performed with a nail nipper.  Filed with dremel without incident.    Return office visit      3 months                Told patient to return for periodic foot care and evaluation due to potential at risk complications.   Sheyanne Munley DPM  

## 2022-05-24 ENCOUNTER — Encounter: Payer: Self-pay | Admitting: Podiatry

## 2022-05-24 ENCOUNTER — Ambulatory Visit (INDEPENDENT_AMBULATORY_CARE_PROVIDER_SITE_OTHER): Payer: Medicare Other | Admitting: Podiatry

## 2022-05-24 DIAGNOSIS — M79674 Pain in right toe(s): Secondary | ICD-10-CM

## 2022-05-24 DIAGNOSIS — B351 Tinea unguium: Secondary | ICD-10-CM

## 2022-05-24 DIAGNOSIS — E118 Type 2 diabetes mellitus with unspecified complications: Secondary | ICD-10-CM

## 2022-05-24 DIAGNOSIS — N183 Chronic kidney disease, stage 3 unspecified: Secondary | ICD-10-CM

## 2022-05-24 DIAGNOSIS — M79675 Pain in left toe(s): Secondary | ICD-10-CM | POA: Diagnosis not present

## 2022-05-24 DIAGNOSIS — E1122 Type 2 diabetes mellitus with diabetic chronic kidney disease: Secondary | ICD-10-CM

## 2022-05-24 NOTE — Progress Notes (Signed)
This patient returns to my office for at risk foot care.  This patient requires this care by a professional since this patient will be at risk due to having chronic kidney disease and diabetes.  This patient is unable to cut nails himself since the patient cannot reach his nails.These nails are painful walking and wearing shoes.  This patient presents for at risk foot care today.  General Appearance  Alert, conversant and in no acute stress.  Vascular  Dorsalis pedis and posterior tibial  pulses are palpable  bilaterally.  Capillary return is within normal limits  bilaterally. Temperature is within normal limits  bilaterally.  Neurologic  Senn-Weinstein monofilament wire test within normal limits  bilaterally. Muscle power within normal limits bilaterally.  Nails Thick disfigured discolored nails with subungual debris  from hallux to fifth toes bilaterally. No evidence of bacterial infection or drainage bilaterally.  Orthopedic  No limitations of motion  feet .  No crepitus or effusions noted.  No bony pathology or digital deformities noted.  Skin  normotropic skin with no porokeratosis noted bilaterally.  No signs of infections or ulcers noted.     Onychomycosis  Pain in right toes  Pain in left toes  Consent was obtained for treatment procedures.   Mechanical debridement of nails 1-5  bilaterally performed with a nail nipper.  Filed with dremel without incident.    Return office visit      3 months                Told patient to return for periodic foot care and evaluation due to potential at risk complications.   Kaneesha Constantino DPM  

## 2022-08-30 ENCOUNTER — Ambulatory Visit (INDEPENDENT_AMBULATORY_CARE_PROVIDER_SITE_OTHER): Payer: Medicare Other | Admitting: Podiatry

## 2022-08-30 ENCOUNTER — Encounter: Payer: Self-pay | Admitting: Podiatry

## 2022-08-30 VITALS — BP 155/76 | HR 59

## 2022-08-30 DIAGNOSIS — M79674 Pain in right toe(s): Secondary | ICD-10-CM | POA: Diagnosis not present

## 2022-08-30 DIAGNOSIS — M79675 Pain in left toe(s): Secondary | ICD-10-CM | POA: Diagnosis not present

## 2022-08-30 DIAGNOSIS — B351 Tinea unguium: Secondary | ICD-10-CM

## 2022-08-30 DIAGNOSIS — N183 Chronic kidney disease, stage 3 unspecified: Secondary | ICD-10-CM

## 2022-08-30 DIAGNOSIS — E1122 Type 2 diabetes mellitus with diabetic chronic kidney disease: Secondary | ICD-10-CM

## 2022-08-30 DIAGNOSIS — E118 Type 2 diabetes mellitus with unspecified complications: Secondary | ICD-10-CM

## 2022-08-30 NOTE — Progress Notes (Signed)
This patient returns to my office for at risk foot care.  This patient requires this care by a professional since this patient will be at risk due to having chronic kidney disease and diabetes.  This patient is unable to cut nails himself since the patient cannot reach his nails.These nails are painful walking and wearing shoes.  This patient presents for at risk foot care today.  General Appearance  Alert, conversant and in no acute stress.  Vascular  Dorsalis pedis and posterior tibial  pulses are palpable  bilaterally.  Capillary return is within normal limits  bilaterally. Temperature is within normal limits  bilaterally.  Neurologic  Senn-Weinstein monofilament wire test within normal limits  bilaterally. Muscle power within normal limits bilaterally.  Nails Thick disfigured discolored nails with subungual debris  from hallux to fifth toes bilaterally. No evidence of bacterial infection or drainage bilaterally.  Orthopedic  No limitations of motion  feet .  No crepitus or effusions noted.  No bony pathology or digital deformities noted.  Skin  normotropic skin with no porokeratosis noted bilaterally.  No signs of infections or ulcers noted.     Onychomycosis  Pain in right toes  Pain in left toes  Consent was obtained for treatment procedures.   Mechanical debridement of nails 1-5  bilaterally performed with a nail nipper.  Filed with dremel without incident.    Return office visit      3 months                Told patient to return for periodic foot care and evaluation due to potential at risk complications.   Gardiner Barefoot DPM

## 2022-12-06 ENCOUNTER — Ambulatory Visit (INDEPENDENT_AMBULATORY_CARE_PROVIDER_SITE_OTHER): Payer: Medicare Other | Admitting: Podiatry

## 2022-12-06 ENCOUNTER — Encounter: Payer: Self-pay | Admitting: Podiatry

## 2022-12-06 DIAGNOSIS — M79675 Pain in left toe(s): Secondary | ICD-10-CM | POA: Diagnosis not present

## 2022-12-06 DIAGNOSIS — B351 Tinea unguium: Secondary | ICD-10-CM

## 2022-12-06 DIAGNOSIS — M79674 Pain in right toe(s): Secondary | ICD-10-CM

## 2022-12-06 DIAGNOSIS — E118 Type 2 diabetes mellitus with unspecified complications: Secondary | ICD-10-CM | POA: Diagnosis not present

## 2022-12-06 NOTE — Progress Notes (Signed)
This patient returns to my office for at risk foot care.  This patient requires this care by a professional since this patient will be at risk due to having chronic kidney disease and diabetes.  This patient is unable to cut nails himself since the patient cannot reach his nails.These nails are painful walking and wearing shoes.  This patient presents for at risk foot care today. ? ?General Appearance  Alert, conversant and in no acute stress. ? ?Vascular  Dorsalis pedis and posterior tibial  pulses are palpable  bilaterally.  Capillary return is within normal limits  bilaterally. Temperature is within normal limits  bilaterally. ? ?Neurologic  Senn-Weinstein monofilament wire test within normal limits  bilaterally. Muscle power within normal limits bilaterally. ? ?Nails Thick disfigured discolored nails with subungual debris  from hallux to fifth toes bilaterally. No evidence of bacterial infection or drainage bilaterally. ? ?Orthopedic  No limitations of motion  feet .  No crepitus or effusions noted.  No bony pathology or digital deformities noted. ? ?Skin  normotropic skin with no porokeratosis noted bilaterally.  No signs of infections or ulcers noted.    ? ?Onychomycosis  Pain in right toes  Pain in left toes ? ?Consent was obtained for treatment procedures.   Mechanical debridement of nails 1-5  bilaterally performed with a nail nipper.  Filed with dremel without incident.  ? ? ?Return office visit      3 months                Told patient to return for periodic foot care and evaluation due to potential at risk complications. ? ? ?Bryan Knight DPM  ?

## 2023-03-08 ENCOUNTER — Ambulatory Visit: Payer: Medicare Other | Admitting: Podiatry

## 2023-03-12 ENCOUNTER — Encounter: Payer: Self-pay | Admitting: Podiatry

## 2023-03-12 ENCOUNTER — Ambulatory Visit (INDEPENDENT_AMBULATORY_CARE_PROVIDER_SITE_OTHER): Payer: Medicare Other | Admitting: Podiatry

## 2023-03-12 DIAGNOSIS — M79674 Pain in right toe(s): Secondary | ICD-10-CM

## 2023-03-12 DIAGNOSIS — B351 Tinea unguium: Secondary | ICD-10-CM

## 2023-03-12 DIAGNOSIS — M79675 Pain in left toe(s): Secondary | ICD-10-CM | POA: Diagnosis not present

## 2023-03-12 DIAGNOSIS — E118 Type 2 diabetes mellitus with unspecified complications: Secondary | ICD-10-CM

## 2023-03-12 NOTE — Progress Notes (Signed)
This patient returns to my office for at risk foot care.  This patient requires this care by a professional since this patient will be at risk due to having chronic kidney disease and diabetes.  This patient is unable to cut nails himself since the patient cannot reach his nails.These nails are painful walking and wearing shoes.  This patient presents for at risk foot care today. ? ?General Appearance  Alert, conversant and in no acute stress. ? ?Vascular  Dorsalis pedis and posterior tibial  pulses are palpable  bilaterally.  Capillary return is within normal limits  bilaterally. Temperature is within normal limits  bilaterally. ? ?Neurologic  Senn-Weinstein monofilament wire test within normal limits  bilaterally. Muscle power within normal limits bilaterally. ? ?Nails Thick disfigured discolored nails with subungual debris  from hallux to fifth toes bilaterally. No evidence of bacterial infection or drainage bilaterally. ? ?Orthopedic  No limitations of motion  feet .  No crepitus or effusions noted.  No bony pathology or digital deformities noted. ? ?Skin  normotropic skin with no porokeratosis noted bilaterally.  No signs of infections or ulcers noted.    ? ?Onychomycosis  Pain in right toes  Pain in left toes ? ?Consent was obtained for treatment procedures.   Mechanical debridement of nails 1-5  bilaterally performed with a nail nipper.  Filed with dremel without incident.  ? ? ?Return office visit      3 months                Told patient to return for periodic foot care and evaluation due to potential at risk complications. ? ? ?Lamonda Noxon DPM  ?

## 2023-06-13 ENCOUNTER — Ambulatory Visit (INDEPENDENT_AMBULATORY_CARE_PROVIDER_SITE_OTHER): Payer: Medicare Other | Admitting: Podiatry

## 2023-06-13 ENCOUNTER — Encounter: Payer: Self-pay | Admitting: Podiatry

## 2023-06-13 DIAGNOSIS — B351 Tinea unguium: Secondary | ICD-10-CM

## 2023-06-13 DIAGNOSIS — M79675 Pain in left toe(s): Secondary | ICD-10-CM | POA: Diagnosis not present

## 2023-06-13 DIAGNOSIS — E118 Type 2 diabetes mellitus with unspecified complications: Secondary | ICD-10-CM | POA: Diagnosis not present

## 2023-06-13 DIAGNOSIS — M79674 Pain in right toe(s): Secondary | ICD-10-CM | POA: Diagnosis not present

## 2023-06-13 NOTE — Progress Notes (Signed)
This patient returns to my office for at risk foot care.  This patient requires this care by a professional since this patient will be at risk due to having chronic kidney disease and diabetes.  This patient is unable to cut nails himself since the patient cannot reach his nails.These nails are painful walking and wearing shoes.  This patient presents for at risk foot care today. ? ?General Appearance  Alert, conversant and in no acute stress. ? ?Vascular  Dorsalis pedis and posterior tibial  pulses are palpable  bilaterally.  Capillary return is within normal limits  bilaterally. Temperature is within normal limits  bilaterally. ? ?Neurologic  Senn-Weinstein monofilament wire test within normal limits  bilaterally. Muscle power within normal limits bilaterally. ? ?Nails Thick disfigured discolored nails with subungual debris  from hallux to fifth toes bilaterally. No evidence of bacterial infection or drainage bilaterally. ? ?Orthopedic  No limitations of motion  feet .  No crepitus or effusions noted.  No bony pathology or digital deformities noted. ? ?Skin  normotropic skin with no porokeratosis noted bilaterally.  No signs of infections or ulcers noted.    ? ?Onychomycosis  Pain in right toes  Pain in left toes ? ?Consent was obtained for treatment procedures.   Mechanical debridement of nails 1-5  bilaterally performed with a nail nipper.  Filed with dremel without incident.  ? ? ?Return office visit      3 months                Told patient to return for periodic foot care and evaluation due to potential at risk complications. ? ? ?Helane Gunther DPM  ?

## 2023-09-03 ENCOUNTER — Encounter: Payer: Self-pay | Admitting: Podiatry

## 2023-09-03 ENCOUNTER — Ambulatory Visit (INDEPENDENT_AMBULATORY_CARE_PROVIDER_SITE_OTHER): Payer: Medicare Other | Admitting: Podiatry

## 2023-09-03 DIAGNOSIS — M79674 Pain in right toe(s): Secondary | ICD-10-CM | POA: Diagnosis not present

## 2023-09-03 DIAGNOSIS — M79675 Pain in left toe(s): Secondary | ICD-10-CM

## 2023-09-03 DIAGNOSIS — E118 Type 2 diabetes mellitus with unspecified complications: Secondary | ICD-10-CM | POA: Diagnosis not present

## 2023-09-03 DIAGNOSIS — B351 Tinea unguium: Secondary | ICD-10-CM | POA: Diagnosis not present

## 2023-09-03 NOTE — Progress Notes (Signed)
This patient returns to my office for at risk foot care.  This patient requires this care by a professional since this patient will be at risk due to having chronic kidney disease and diabetes.  This patient is unable to cut nails himself since the patient cannot reach his nails.These nails are painful walking and wearing shoes.  This patient presents for at risk foot care today.  General Appearance  Alert, conversant and in no acute stress.  Vascular  Dorsalis pedis and posterior tibial  pulses are palpable  bilaterally.  Capillary return is within normal limits  bilaterally. Temperature is within normal limits  bilaterally.  Neurologic  Senn-Weinstein monofilament wire test within normal limits  bilaterally. Muscle power within normal limits bilaterally.  Nails Thick disfigured discolored nails with subungual debris  from hallux to fifth toes bilaterally. No evidence of bacterial infection or drainage bilaterally.  Orthopedic  No limitations of motion  feet .  No crepitus or effusions noted.  No bony pathology or digital deformities noted.  Skin  normotropic skin with no porokeratosis noted bilaterally.  No signs of infections or ulcers noted.     Onychomycosis  Pain in right toes  Pain in left toes  Consent was obtained for treatment procedures.   Mechanical debridement of nails 1-5  bilaterally performed with a nail nipper.  Filed with dremel without incident.    Return office visit      3 months                Told patient to return for periodic foot care and evaluation due to potential at risk complications.   Yovana Scogin DPM  

## 2023-12-10 ENCOUNTER — Ambulatory Visit (INDEPENDENT_AMBULATORY_CARE_PROVIDER_SITE_OTHER): Payer: Medicare Other | Admitting: Podiatry

## 2023-12-10 ENCOUNTER — Encounter: Payer: Self-pay | Admitting: Podiatry

## 2023-12-10 DIAGNOSIS — B351 Tinea unguium: Secondary | ICD-10-CM | POA: Diagnosis not present

## 2023-12-10 DIAGNOSIS — E118 Type 2 diabetes mellitus with unspecified complications: Secondary | ICD-10-CM | POA: Diagnosis not present

## 2023-12-10 DIAGNOSIS — M79674 Pain in right toe(s): Secondary | ICD-10-CM | POA: Diagnosis not present

## 2023-12-10 DIAGNOSIS — M79675 Pain in left toe(s): Secondary | ICD-10-CM

## 2023-12-10 NOTE — Progress Notes (Signed)
This patient returns to my office for at risk foot care.  This patient requires this care by a professional since this patient will be at risk due to having chronic kidney disease and diabetes.  This patient is unable to cut nails himself since the patient cannot reach his nails.These nails are painful walking and wearing shoes.  This patient presents for at risk foot care today.  General Appearance  Alert, conversant and in no acute stress.  Vascular  Dorsalis pedis and posterior tibial  pulses are palpable  bilaterally.  Capillary return is within normal limits  bilaterally. Temperature is within normal limits  bilaterally.  Neurologic  Senn-Weinstein monofilament wire test within normal limits  bilaterally. Muscle power within normal limits bilaterally.  Nails Thick disfigured discolored nails with subungual debris  from hallux to fifth toes bilaterally. No evidence of bacterial infection or drainage bilaterally.  Orthopedic  No limitations of motion  feet .  No crepitus or effusions noted.  No bony pathology or digital deformities noted.  Skin  normotropic skin with no porokeratosis noted bilaterally.  No signs of infections or ulcers noted.     Onychomycosis  Pain in right toes  Pain in left toes  Consent was obtained for treatment procedures.   Mechanical debridement of nails 1-5  bilaterally performed with a nail nipper.  Filed with dremel without incident.    Return office visit      3 months                Told patient to return for periodic foot care and evaluation due to potential at risk complications.   Yovana Scogin DPM  

## 2024-03-10 ENCOUNTER — Ambulatory Visit (INDEPENDENT_AMBULATORY_CARE_PROVIDER_SITE_OTHER): Admitting: Podiatry

## 2024-03-10 ENCOUNTER — Encounter: Payer: Self-pay | Admitting: Podiatry

## 2024-03-10 DIAGNOSIS — M79674 Pain in right toe(s): Secondary | ICD-10-CM | POA: Diagnosis not present

## 2024-03-10 DIAGNOSIS — B351 Tinea unguium: Secondary | ICD-10-CM | POA: Diagnosis not present

## 2024-03-10 DIAGNOSIS — E1122 Type 2 diabetes mellitus with diabetic chronic kidney disease: Secondary | ICD-10-CM | POA: Diagnosis not present

## 2024-03-10 DIAGNOSIS — M79675 Pain in left toe(s): Secondary | ICD-10-CM

## 2024-03-10 DIAGNOSIS — N183 Chronic kidney disease, stage 3 unspecified: Secondary | ICD-10-CM

## 2024-03-10 DIAGNOSIS — E118 Type 2 diabetes mellitus with unspecified complications: Secondary | ICD-10-CM

## 2024-03-10 NOTE — Progress Notes (Signed)
This patient returns to my office for at risk foot care.  This patient requires this care by a professional since this patient will be at risk due to having chronic kidney disease and diabetes.  This patient is unable to cut nails himself since the patient cannot reach his nails.These nails are painful walking and wearing shoes.  This patient presents for at risk foot care today.  General Appearance  Alert, conversant and in no acute stress.  Vascular  Dorsalis pedis and posterior tibial  pulses are palpable  bilaterally.  Capillary return is within normal limits  bilaterally. Temperature is within normal limits  bilaterally.  Neurologic  Senn-Weinstein monofilament wire test within normal limits  bilaterally. Muscle power within normal limits bilaterally.  Nails Thick disfigured discolored nails with subungual debris  from hallux to fifth toes bilaterally. No evidence of bacterial infection or drainage bilaterally.  Orthopedic  No limitations of motion  feet .  No crepitus or effusions noted.  No bony pathology or digital deformities noted.  Skin  normotropic skin with no porokeratosis noted bilaterally.  No signs of infections or ulcers noted.     Onychomycosis  Pain in right toes  Pain in left toes  Consent was obtained for treatment procedures.   Mechanical debridement of nails 1-5  bilaterally performed with a nail nipper.  Filed with dremel without incident.    Return office visit      3 months                Told patient to return for periodic foot care and evaluation due to potential at risk complications.   Yovana Scogin DPM  

## 2024-06-09 ENCOUNTER — Ambulatory Visit (INDEPENDENT_AMBULATORY_CARE_PROVIDER_SITE_OTHER): Admitting: Podiatry

## 2024-06-09 ENCOUNTER — Encounter: Payer: Self-pay | Admitting: Podiatry

## 2024-06-09 DIAGNOSIS — M79675 Pain in left toe(s): Secondary | ICD-10-CM

## 2024-06-09 DIAGNOSIS — E1122 Type 2 diabetes mellitus with diabetic chronic kidney disease: Secondary | ICD-10-CM

## 2024-06-09 DIAGNOSIS — N183 Chronic kidney disease, stage 3 unspecified: Secondary | ICD-10-CM

## 2024-06-09 DIAGNOSIS — B351 Tinea unguium: Secondary | ICD-10-CM

## 2024-06-09 DIAGNOSIS — M79674 Pain in right toe(s): Secondary | ICD-10-CM

## 2024-06-09 DIAGNOSIS — E118 Type 2 diabetes mellitus with unspecified complications: Secondary | ICD-10-CM

## 2024-06-09 NOTE — Progress Notes (Signed)
This patient returns to my office for at risk foot care.  This patient requires this care by a professional since this patient will be at risk due to having chronic kidney disease and diabetes.  This patient is unable to cut nails himself since the patient cannot reach his nails.These nails are painful walking and wearing shoes.  This patient presents for at risk foot care today.  General Appearance  Alert, conversant and in no acute stress.  Vascular  Dorsalis pedis and posterior tibial  pulses are palpable  bilaterally.  Capillary return is within normal limits  bilaterally. Temperature is within normal limits  bilaterally.  Neurologic  Senn-Weinstein monofilament wire test within normal limits  bilaterally. Muscle power within normal limits bilaterally.  Nails Thick disfigured discolored nails with subungual debris  from hallux to fifth toes bilaterally. No evidence of bacterial infection or drainage bilaterally.  Orthopedic  No limitations of motion  feet .  No crepitus or effusions noted.  No bony pathology or digital deformities noted.  Skin  normotropic skin with no porokeratosis noted bilaterally.  No signs of infections or ulcers noted.     Onychomycosis  Pain in right toes  Pain in left toes  Consent was obtained for treatment procedures.   Mechanical debridement of nails 1-5  bilaterally performed with a nail nipper.  Filed with dremel without incident.    Return office visit      3 months                Told patient to return for periodic foot care and evaluation due to potential at risk complications.   Yovana Scogin DPM  

## 2024-09-08 ENCOUNTER — Encounter: Payer: Self-pay | Admitting: Podiatry

## 2024-09-08 ENCOUNTER — Ambulatory Visit: Admitting: Podiatry

## 2024-09-08 DIAGNOSIS — E118 Type 2 diabetes mellitus with unspecified complications: Secondary | ICD-10-CM

## 2024-09-08 DIAGNOSIS — N183 Chronic kidney disease, stage 3 unspecified: Secondary | ICD-10-CM

## 2024-09-08 DIAGNOSIS — M79674 Pain in right toe(s): Secondary | ICD-10-CM | POA: Diagnosis not present

## 2024-09-08 DIAGNOSIS — M79675 Pain in left toe(s): Secondary | ICD-10-CM | POA: Diagnosis not present

## 2024-09-08 DIAGNOSIS — E1122 Type 2 diabetes mellitus with diabetic chronic kidney disease: Secondary | ICD-10-CM

## 2024-09-08 DIAGNOSIS — B351 Tinea unguium: Secondary | ICD-10-CM | POA: Diagnosis not present

## 2024-09-08 NOTE — Progress Notes (Signed)
This patient returns to my office for at risk foot care.  This patient requires this care by a professional since this patient will be at risk due to having chronic kidney disease and diabetes.  This patient is unable to cut nails himself since the patient cannot reach his nails.These nails are painful walking and wearing shoes.  This patient presents for at risk foot care today.  General Appearance  Alert, conversant and in no acute stress.  Vascular  Dorsalis pedis and posterior tibial  pulses are palpable  bilaterally.  Capillary return is within normal limits  bilaterally. Temperature is within normal limits  bilaterally.  Neurologic  Senn-Weinstein monofilament wire test within normal limits  bilaterally. Muscle power within normal limits bilaterally.  Nails Thick disfigured discolored nails with subungual debris  from hallux to fifth toes bilaterally. No evidence of bacterial infection or drainage bilaterally.  Orthopedic  No limitations of motion  feet .  No crepitus or effusions noted.  No bony pathology or digital deformities noted.  Skin  normotropic skin with no porokeratosis noted bilaterally.  No signs of infections or ulcers noted.     Onychomycosis  Pain in right toes  Pain in left toes  Consent was obtained for treatment procedures.   Mechanical debridement of nails 1-5  bilaterally performed with a nail nipper.  Filed with dremel without incident.    Return office visit      3 months                Told patient to return for periodic foot care and evaluation due to potential at risk complications.   Yovana Scogin DPM  

## 2024-12-08 ENCOUNTER — Ambulatory Visit: Admitting: Podiatry
